# Patient Record
Sex: Female | Born: 1940 | Hispanic: No | Smoking: Former smoker
Health system: Southern US, Community
[De-identification: ages and names within clinical notes are randomized; demographics above are authoritative.]

## PROBLEM LIST (undated history)

## (undated) DIAGNOSIS — E669 Obesity, unspecified: Secondary | ICD-10-CM

## (undated) DIAGNOSIS — I809 Phlebitis and thrombophlebitis of unspecified site: Secondary | ICD-10-CM

## (undated) DIAGNOSIS — L309 Dermatitis, unspecified: Secondary | ICD-10-CM

## (undated) DIAGNOSIS — M1990 Unspecified osteoarthritis, unspecified site: Secondary | ICD-10-CM

## (undated) DIAGNOSIS — F329 Major depressive disorder, single episode, unspecified: Secondary | ICD-10-CM

## (undated) DIAGNOSIS — I639 Cerebral infarction, unspecified: Secondary | ICD-10-CM

## (undated) DIAGNOSIS — J45909 Unspecified asthma, uncomplicated: Secondary | ICD-10-CM

## (undated) DIAGNOSIS — G4730 Sleep apnea, unspecified: Secondary | ICD-10-CM

## (undated) DIAGNOSIS — I10 Essential (primary) hypertension: Secondary | ICD-10-CM

## (undated) DIAGNOSIS — R319 Hematuria, unspecified: Secondary | ICD-10-CM

## (undated) DIAGNOSIS — I2510 Atherosclerotic heart disease of native coronary artery without angina pectoris: Secondary | ICD-10-CM

## (undated) DIAGNOSIS — E079 Disorder of thyroid, unspecified: Secondary | ICD-10-CM

## (undated) DIAGNOSIS — E119 Type 2 diabetes mellitus without complications: Secondary | ICD-10-CM

## (undated) DIAGNOSIS — Z5189 Encounter for other specified aftercare: Secondary | ICD-10-CM

## (undated) DIAGNOSIS — I2699 Other pulmonary embolism without acute cor pulmonale: Secondary | ICD-10-CM

## (undated) DIAGNOSIS — K219 Gastro-esophageal reflux disease without esophagitis: Secondary | ICD-10-CM

## (undated) DIAGNOSIS — E785 Hyperlipidemia, unspecified: Secondary | ICD-10-CM

## (undated) DIAGNOSIS — F410 Panic disorder [episodic paroxysmal anxiety] without agoraphobia: Secondary | ICD-10-CM

## (undated) DIAGNOSIS — I509 Heart failure, unspecified: Secondary | ICD-10-CM

## (undated) DIAGNOSIS — J449 Chronic obstructive pulmonary disease, unspecified: Secondary | ICD-10-CM

## (undated) DIAGNOSIS — F988 Other specified behavioral and emotional disorders with onset usually occurring in childhood and adolescence: Secondary | ICD-10-CM

## (undated) DIAGNOSIS — F32A Depression, unspecified: Secondary | ICD-10-CM

## (undated) DIAGNOSIS — I739 Peripheral vascular disease, unspecified: Secondary | ICD-10-CM

## (undated) HISTORY — PX: BREAST SURGERY: SHX581

## (undated) HISTORY — PX: ABDOMINAL HYSTERECTOMY: SHX81

## (undated) HISTORY — PX: FRACTURE SURGERY: SHX138

## (undated) HISTORY — PX: CHOLECYSTECTOMY: SHX55

## (undated) HISTORY — PX: BACK SURGERY: SHX140

## (undated) HISTORY — PX: BREAST BIOPSY: SHX20

## (undated) HISTORY — PX: TONSILLECTOMY: SUR1361

## (undated) HISTORY — PX: CARDIAC SURGERY: SHX584

---

## 2003-10-06 ENCOUNTER — Other Ambulatory Visit: Payer: Self-pay

## 2003-10-24 ENCOUNTER — Other Ambulatory Visit: Payer: Self-pay

## 2004-05-28 ENCOUNTER — Emergency Department: Payer: Self-pay | Admitting: Emergency Medicine

## 2004-05-28 ENCOUNTER — Other Ambulatory Visit: Payer: Self-pay

## 2005-08-30 ENCOUNTER — Other Ambulatory Visit: Payer: Self-pay

## 2005-08-30 ENCOUNTER — Emergency Department: Payer: Self-pay | Admitting: General Practice

## 2006-02-14 ENCOUNTER — Ambulatory Visit: Payer: Self-pay | Admitting: Nurse Practitioner

## 2006-06-01 ENCOUNTER — Ambulatory Visit: Payer: Self-pay | Admitting: Internal Medicine

## 2006-06-17 ENCOUNTER — Ambulatory Visit: Payer: Self-pay | Admitting: Cardiovascular Disease

## 2006-11-15 ENCOUNTER — Other Ambulatory Visit: Payer: Self-pay

## 2006-11-15 ENCOUNTER — Inpatient Hospital Stay: Payer: Self-pay | Admitting: *Deleted

## 2007-01-06 ENCOUNTER — Ambulatory Visit: Payer: Self-pay | Admitting: Family Medicine

## 2007-09-02 ENCOUNTER — Emergency Department: Payer: Self-pay | Admitting: Unknown Physician Specialty

## 2007-09-13 ENCOUNTER — Emergency Department: Payer: Self-pay | Admitting: Unknown Physician Specialty

## 2007-11-20 ENCOUNTER — Ambulatory Visit: Payer: Self-pay | Admitting: Family Medicine

## 2007-11-23 ENCOUNTER — Encounter: Payer: Self-pay | Admitting: Specialist

## 2007-11-29 ENCOUNTER — Encounter: Payer: Self-pay | Admitting: Specialist

## 2007-12-30 ENCOUNTER — Encounter: Payer: Self-pay | Admitting: Specialist

## 2008-01-30 ENCOUNTER — Encounter: Payer: Self-pay | Admitting: Specialist

## 2008-02-29 ENCOUNTER — Encounter: Payer: Self-pay | Admitting: Specialist

## 2008-03-30 ENCOUNTER — Emergency Department: Payer: Self-pay | Admitting: Emergency Medicine

## 2008-04-01 ENCOUNTER — Encounter: Payer: Self-pay | Admitting: Specialist

## 2008-09-25 ENCOUNTER — Inpatient Hospital Stay: Payer: Self-pay | Admitting: Internal Medicine

## 2008-11-21 ENCOUNTER — Emergency Department: Payer: Self-pay | Admitting: Emergency Medicine

## 2009-01-16 ENCOUNTER — Ambulatory Visit: Payer: Self-pay | Admitting: Family Medicine

## 2009-01-23 ENCOUNTER — Ambulatory Visit: Payer: Self-pay | Admitting: Family Medicine

## 2009-03-26 ENCOUNTER — Ambulatory Visit: Payer: Self-pay | Admitting: Surgery

## 2009-03-31 ENCOUNTER — Ambulatory Visit: Payer: Self-pay | Admitting: Cardiology

## 2009-03-31 ENCOUNTER — Ambulatory Visit: Payer: Self-pay | Admitting: Surgery

## 2009-06-16 ENCOUNTER — Ambulatory Visit: Payer: Self-pay | Admitting: Cardiovascular Disease

## 2009-06-28 ENCOUNTER — Inpatient Hospital Stay: Payer: Self-pay | Admitting: Internal Medicine

## 2009-07-16 ENCOUNTER — Encounter: Payer: Self-pay | Admitting: Internal Medicine

## 2009-07-29 ENCOUNTER — Encounter: Payer: Self-pay | Admitting: Internal Medicine

## 2009-08-29 ENCOUNTER — Encounter: Payer: Self-pay | Admitting: Internal Medicine

## 2009-10-29 ENCOUNTER — Ambulatory Visit: Payer: Self-pay | Admitting: Gastroenterology

## 2009-12-24 ENCOUNTER — Inpatient Hospital Stay: Payer: Self-pay | Admitting: Internal Medicine

## 2010-01-10 ENCOUNTER — Emergency Department: Payer: Self-pay | Admitting: Emergency Medicine

## 2010-01-12 ENCOUNTER — Inpatient Hospital Stay: Payer: Self-pay | Admitting: Internal Medicine

## 2010-02-25 ENCOUNTER — Inpatient Hospital Stay: Payer: Self-pay | Admitting: Internal Medicine

## 2010-04-06 ENCOUNTER — Other Ambulatory Visit: Payer: Self-pay | Admitting: Internal Medicine

## 2010-06-29 IMAGING — CR DG CHEST 2V
1 series · 2 of 2 positions shown · non-contrast
Comparison: none

REASON FOR EXAM: hypotension
COMMENTS:   May transport without cardiac monitor

PROCEDURE:     DXR - DXR CHEST PA (OR AP) AND LATERAL  - June 28, 2009  [DATE]
RESULT:     Comparison is made to a prior study 09/25/2008. Again noted is
mild elevation of the left hemidiaphragm with mild left base atelectasis in
the right lung. The cardiovascular structures are unremarkable.

[Series 1: view not recorded · 0.17mm/px · 2 of 2 slices shown]
[im 1/2]
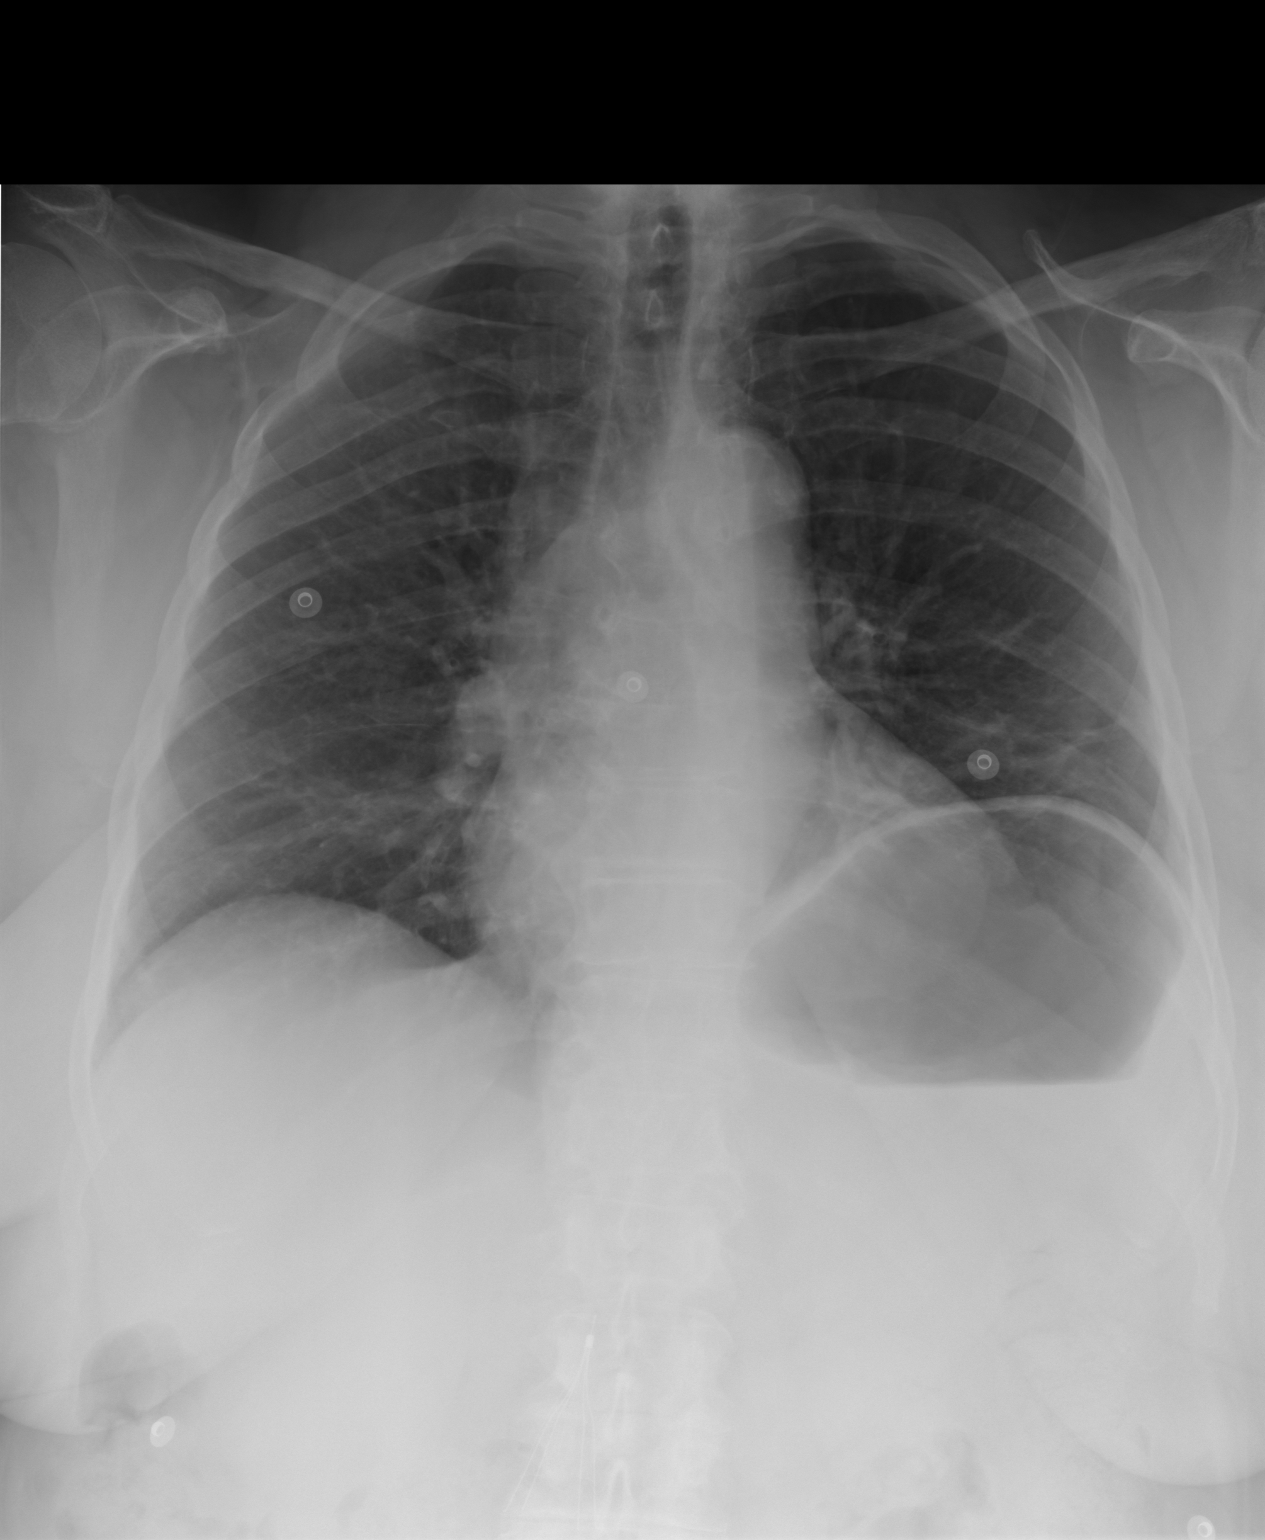
[im 2/2]
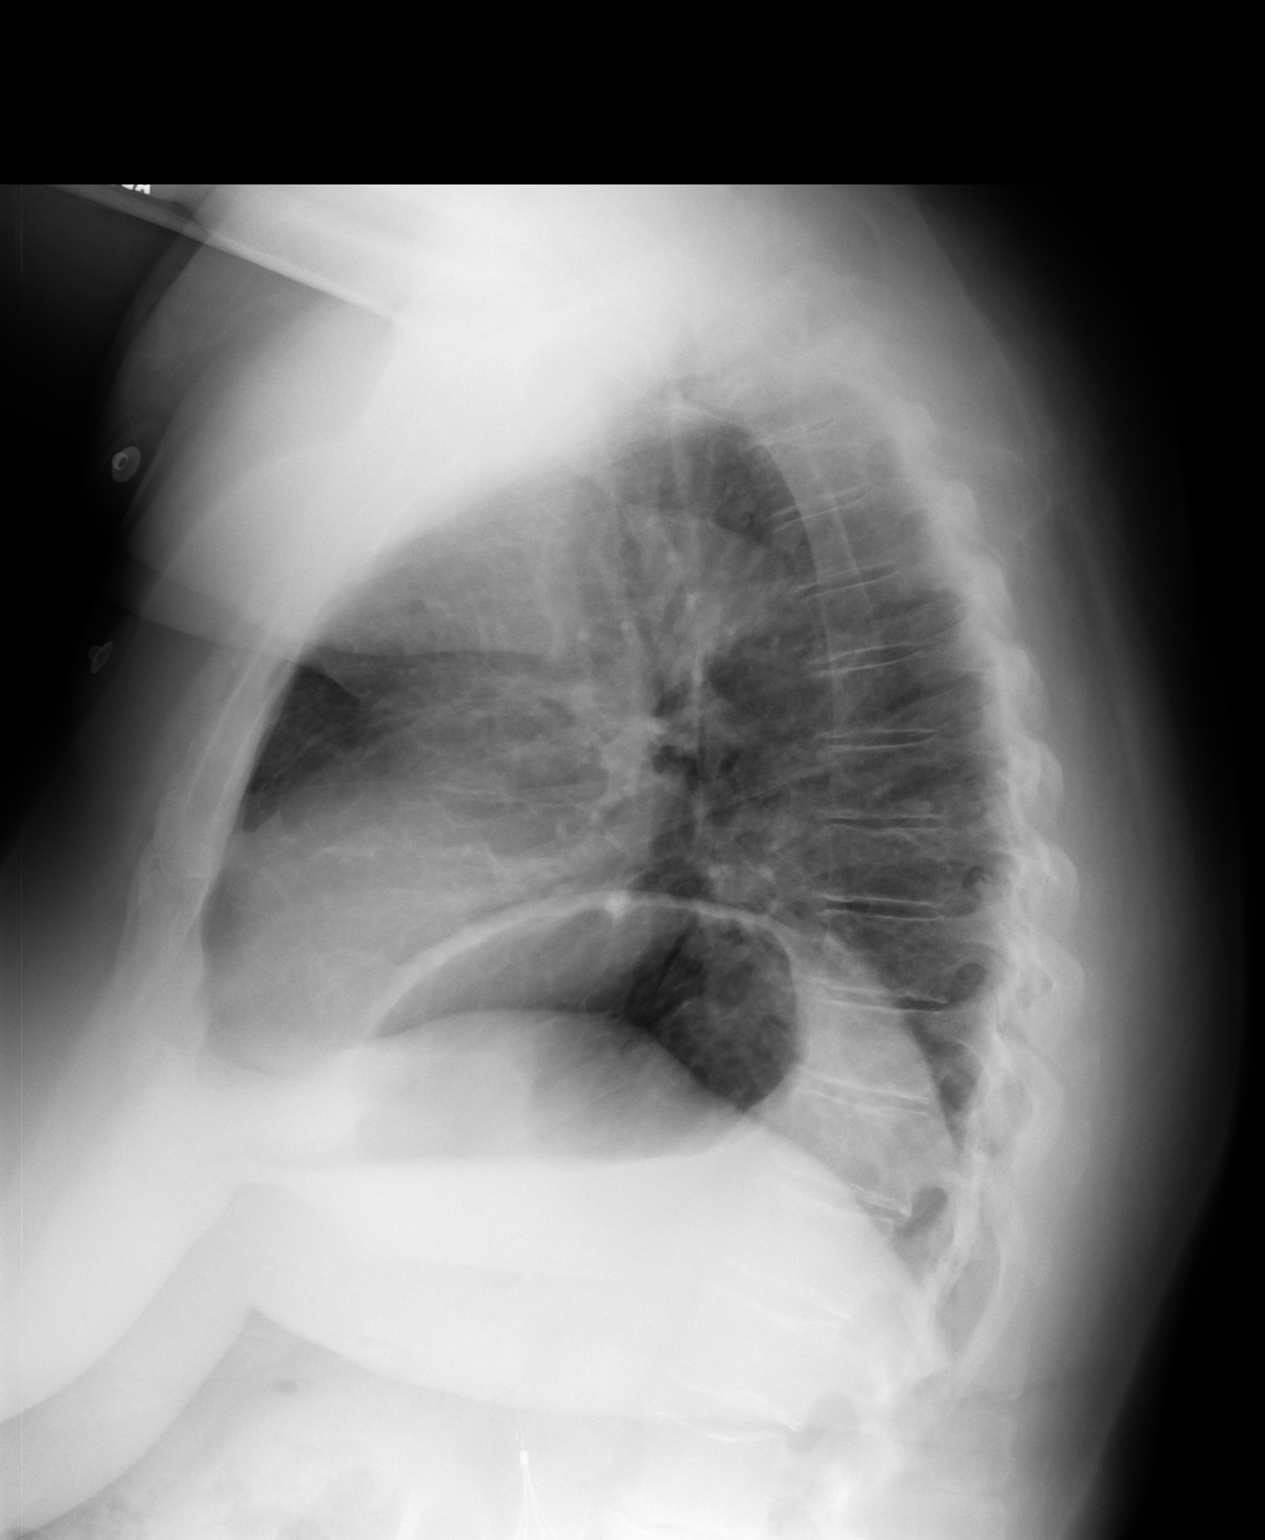

[2 of 2 positions shown; findings below may reference images not displayed]

IMPRESSION: No acute cardiopulmonary disease.

## 2010-08-20 ENCOUNTER — Ambulatory Visit: Payer: Self-pay | Admitting: Internal Medicine

## 2011-10-06 ENCOUNTER — Ambulatory Visit: Payer: Self-pay | Admitting: Internal Medicine

## 2012-02-01 ENCOUNTER — Ambulatory Visit: Payer: Self-pay | Admitting: Specialist

## 2012-02-07 ENCOUNTER — Encounter: Payer: Self-pay | Admitting: Cardiothoracic Surgery

## 2012-02-07 ENCOUNTER — Encounter: Payer: Self-pay | Admitting: Nurse Practitioner

## 2012-02-29 ENCOUNTER — Encounter: Payer: Self-pay | Admitting: Nurse Practitioner

## 2012-02-29 ENCOUNTER — Encounter: Payer: Self-pay | Admitting: Cardiothoracic Surgery

## 2012-03-20 ENCOUNTER — Emergency Department: Payer: Self-pay | Admitting: Emergency Medicine

## 2012-03-21 LAB — CBC WITH DIFFERENTIAL/PLATELET
Basophil #: 0.1 10*3/uL (ref 0.0–0.1)
Basophil %: 1.1 %
Eosinophil #: 0.2 10*3/uL (ref 0.0–0.7)
Eosinophil %: 1.5 %
HCT: 41 % (ref 35.0–47.0)
Lymphocyte #: 2.6 10*3/uL (ref 1.0–3.6)
Lymphocyte %: 23.2 %
MCH: 26.3 pg (ref 26.0–34.0)
MCHC: 32.3 g/dL (ref 32.0–36.0)
MCV: 82 fL (ref 80–100)
Monocyte #: 1 x10 3/mm — ABNORMAL HIGH (ref 0.2–0.9)
Neutrophil %: 65.8 %
Platelet: 244 10*3/uL (ref 150–440)
RBC: 5.02 10*6/uL (ref 3.80–5.20)
RDW: 15.1 % — ABNORMAL HIGH (ref 11.5–14.5)
WBC: 11.3 10*3/uL — ABNORMAL HIGH (ref 3.6–11.0)

## 2012-03-21 LAB — COMPREHENSIVE METABOLIC PANEL
Albumin: 3.9 g/dL (ref 3.4–5.0)
Alkaline Phosphatase: 120 U/L (ref 50–136)
Anion Gap: 12 (ref 7–16)
Calcium, Total: 9.3 mg/dL (ref 8.5–10.1)
Chloride: 100 mmol/L (ref 98–107)
Co2: 26 mmol/L (ref 21–32)
Glucose: 124 mg/dL — ABNORMAL HIGH (ref 65–99)
Osmolality: 279 (ref 275–301)
Potassium: 3.5 mmol/L (ref 3.5–5.1)
SGOT(AST): 34 U/L (ref 15–37)
Total Protein: 8.6 g/dL — ABNORMAL HIGH (ref 6.4–8.2)

## 2012-03-21 LAB — PRO B NATRIURETIC PEPTIDE: B-Type Natriuretic Peptide: 299 pg/mL — ABNORMAL HIGH (ref 0–125)

## 2012-03-21 LAB — TROPONIN I: Troponin-I: <0.02 ng/mL

## 2013-02-19 ENCOUNTER — Emergency Department: Payer: Self-pay | Admitting: Emergency Medicine

## 2013-02-19 LAB — COMPREHENSIVE METABOLIC PANEL
Albumin: 3.3 g/dL — ABNORMAL LOW (ref 3.4–5.0)
Alkaline Phosphatase: 120 U/L (ref 50–136)
Anion Gap: 11 (ref 7–16)
BUN: 25 mg/dL — ABNORMAL HIGH (ref 7–18)
Calcium, Total: 8.9 mg/dL (ref 8.5–10.1)
EGFR (African American): 52 — ABNORMAL LOW
EGFR (Non-African Amer.): 45 — ABNORMAL LOW
Osmolality: 271 (ref 275–301)
Potassium: 4.8 mmol/L (ref 3.5–5.1)
SGOT(AST): 48 U/L — ABNORMAL HIGH (ref 15–37)
SGPT (ALT): 21 U/L (ref 12–78)
Sodium: 133 mmol/L — ABNORMAL LOW (ref 136–145)
Total Protein: 7.6 g/dL (ref 6.4–8.2)

## 2013-02-19 LAB — CBC
HCT: 34.6 % — ABNORMAL LOW (ref 35.0–47.0)
MCH: 25.9 pg — ABNORMAL LOW (ref 26.0–34.0)
MCHC: 33.1 g/dL (ref 32.0–36.0)
Platelet: 172 10*3/uL (ref 150–440)
RBC: 4.43 10*6/uL (ref 3.80–5.20)
RDW: 16.1 % — ABNORMAL HIGH (ref 11.5–14.5)
WBC: 8.5 10*3/uL (ref 3.6–11.0)

## 2013-02-19 LAB — TROPONIN I: Troponin-I: <0.02 ng/mL

## 2013-02-19 LAB — PRO B NATRIURETIC PEPTIDE: B-Type Natriuretic Peptide: 470 pg/mL — ABNORMAL HIGH (ref 0–125)

## 2013-02-19 LAB — CK TOTAL AND CKMB (NOT AT ARMC): CK-MB: 1.5 ng/mL (ref 0.5–3.6)

## 2013-05-27 ENCOUNTER — Emergency Department: Payer: Self-pay | Admitting: Emergency Medicine

## 2013-05-27 LAB — COMPREHENSIVE METABOLIC PANEL
BUN: 34 mg/dL — ABNORMAL HIGH (ref 7–18)
Bilirubin,Total: 0.3 mg/dL (ref 0.2–1.0)
Calcium, Total: 8.9 mg/dL (ref 8.5–10.1)
Chloride: 102 mmol/L (ref 98–107)
EGFR (African American): 37 — ABNORMAL LOW
EGFR (Non-African Amer.): 32 — ABNORMAL LOW
Glucose: 89 mg/dL (ref 65–99)
Potassium: 4.6 mmol/L (ref 3.5–5.1)
SGPT (ALT): 18 U/L (ref 12–78)
Sodium: 137 mmol/L (ref 136–145)
Total Protein: 7.9 g/dL (ref 6.4–8.2)

## 2013-05-27 LAB — CBC
HCT: 35.8 % (ref 35.0–47.0)
MCV: 79 fL — ABNORMAL LOW (ref 80–100)
Platelet: 211 10*3/uL (ref 150–440)
RBC: 4.51 10*6/uL (ref 3.80–5.20)
RDW: 14.9 % — ABNORMAL HIGH (ref 11.5–14.5)
WBC: 9.7 10*3/uL (ref 3.6–11.0)

## 2013-05-27 LAB — APTT: Activated PTT: 32.3 secs (ref 23.6–35.9)

## 2013-05-27 LAB — PROTIME-INR
INR: 1
Prothrombin Time: 13.4 secs (ref 11.5–14.7)

## 2014-09-20 NOTE — Consult Note (Signed)
PATIENT NAME:  Kristen Watts, Kristen Watts MR#:  [id number] DATE OF BIRTH:  16-Jan-1941  DATE OF CONSULTATION:  02/19/2013  REFERRING PHYSICIAN:  Aletha Halim, MD.  CONSULTING PHYSICIAN:  Chariah Bailey A. Allena Katz, MD.  CARDIOLOGIST: Marcina Millard, MD.   CHIEF COMPLAINT: Pain in between shoulder blades for 1 to 2 days.   HISTORY OF PRESENT ILLNESS: Kristen Watts is a 74 year old Caucasian female with past medical history of COPD, hypertension, type 2 diabetes, hyperlipidemia, and coronary artery disease, who comes to the Emergency Room after she started noticing some discomfort in her back in between the shoulder blades. The patient says she could not get comfortable after a couple nitros did not make much difference. Came to the Emergency Room where she is hemodynamically stable without any acute EKG changes.   Her first set of troponins is negative. She denies any chest pain or shortness of breath. The patient's pain is reproducible on rubbing in between her shoulder blades. She then reports that she has been very anxious and stressed out at home trying to get her financial stuff organized and has not been sleeping well for the past several days along with exhausting trips to the bank trying to get accounts set up. She felt better after she got 2 Norcos  in the Emergency Room. Internal medicine was consulted for her above symptoms.   PAST MEDICAL HISTORY:  1.  History of hypothyroidism.  2.  CAD status post last heart catheter done in 2008, which showed moderate 2-vessel disease. Medical management was recommended.  3.  History of PE. 4.  Type 2 diabetes.  5.  Hypertension.  6.  Osteoarthritis.  7.  History of CVA, peripheral vascular disease.  8.  History of panic attacks.  9.  Chronic pain.  10.  ADHD.  11.  Hyperlipidemia.   PAST SURGICAL HISTORY:  1.  Hysterectomy.  2.  Right knee placement.  3.  Right great toe surgery.  4.  Bilateral cervical rib resection.  5.  Cervical disk fusion.  6.   Laparotomy.   ALLERGIES: DARVON, DEMEROL, LATEX, AND MORPHINE.   SOCIAL HISTORY: Lives at home by herself. Negative for alcohol or tobacco use.   FAMILY HISTORY: Positive for hypertension and sudden death.   MEDICATIONS:  1.  Xanax 0.25 mg tablet 1 tablet two to 3 times a day as needed.  2.  Losartan 160 mg p.o. daily.  3.  Temazepam 15 mg p.o. daily at bedtime.  4.  Synthroid 150 mcg p.o. daily.  5.  Strattera 40 mg p.o. daily.  6.  Omega-3 polyunsaturated fatty acid 1000 mg one tablet b.i.d.  7.  Nitroglycerin 0.4 mg sublingual as needed.  8.  Singulair 10 mg daily.  9.  Metformin 500 mg b.i.d.  10.  Isosorbide mononitrate 30 mg extended release p.o. daily.  11.  Lasix 40 mg p.o. daily.  12.  Dexilant 60 mg p.o. daily.  13.  Celebrex 200 mg daily.  14.  Carvedilol 6.25 b.i.d.  15.  DuoNebs 3 mL every 4 hours as needed.  16.  Advair 250/50 one puff b.i.d.  17.  Acetaminophen/hydrocodone 5/325, one tablet every 4 hours as needed.   REVIEW OF SYSTEMS:  CONSTITUTIONAL: No fever, fatigue, weakness.  EYES: No blurred or double vision, glaucoma or cataracts.  ENT: No tinnitus, ear pain, hearing loss.  RESPIRATORY: No cough, wheeze, hemoptysis or dyspnea.  CARDIOVASCULAR: No chest pain, orthopnea, edema. Positive for hypertension. GASTROINTESTINAL: No nausea, vomiting, diarrhea, abdominal pain or GERD.  GENITOURINARY: No dysuria, hematuria, frequency, or incontinence.  ENDOCRINE: No polyuria, nocturia or thyroid problems.  HEMATOLOGY: No anemia or easy bruising or bleeding.  SKIN: No acne, rash or lesions.  MUSCULOSKELETAL: Positive for pain in between the shoulder blades and arthritis. No gout or swelling of joints.  NEUROLOGIC: No CVA, TIA, ataxia, or dementia.  PSYCHIATRIC: No anxiety, depression or bipolar disorder.   All other systems reviewed and negative.   PHYSICAL EXAMINATION:  GENERAL: Awake, alert, oriented x3, not in acute distress.  VITAL SIGNS: Afebrile. Pulse is  80. Blood pressure is 91/52. Sats are 94% on room air.  HEENT: Atraumatic, normocephalic. Pupils PERRLA. EOM intact. Oral mucosa is moist.  NECK: Supple. No JVD. No carotid bruit.  RESPIRATORY: Clear to auscultation bilaterally. No rales, rhonchi, respiratory distress or labored breathing.  CARDIOVASCULAR: Both heart sounds are normal. Rate, rhythm regular. PMI not lateralized. Chest is nontender. Good pedal pulses. Good femoral pulses. No lower extremity edema.  ABDOMEN: Soft, benign, nontender. No organomegaly. Positive bowel sounds.  NEUROLOGIC: Grossly intact cranial nerves II through XII are normal. Motor or sensory deficit.  EXTREMITIES: Good pedal pulses. Good femoral pulses. No lower extremity edema.  BACK AND SPINE: The patient does have tenderness in between her shoulder blades in the upper part which is reproducible on palpation. Pain is localized. Appears muscular strain.  SKIN: Warm and dry.  PSYCHIATRIC: Awake, alert, oriented x3.   EKG shows sinus rhythm with PVC.   CHEST X-RAY: No acute cardiopulmonary abnormality.   Cardiac enzymes, first set negative.   Glucose is 106. BUN is 25. Creatinine is 1.21. Sodium is 133. LFTs within normal limits except SGOT of 48.   White count is 8.5. Hemoglobin and hematocrit is 11.4 and 34.6. Platelet count is 172. MCV is 78.   B-type natriuretic peptide is 470.   ASSESSMENT AND PLAN: Kristen Watts, 74 years old, is with history of hypertension, diabetes, hyperlipidemia, and coronary artery disease, and comes in: 1.  Back pain in between shoulder blades. It appears to be muscular strain since it is reproducible on palpation and on back rub. She reports not really sleeping well, had a lot of stress over taking care of finances at home by herself. Her EKG showed no acute changes. Troponins are negative. No acute chest pain or shortness of breath. The patient is pain free at present. She reports taking muscle relaxants which help her, and Xanax  helps her with her discomfort at home. Last catheterization in 2008 showed mild-to-moderate 2-vessel disease. Will continue her  cardiac medications. The case was discussed with Dr. Juliann Paresallwood who agrees with the above and will get a follow-up with Dr. Darrold JunkerParaschos in the next week. Use heating pad and over-the-counter capsaicin rub for back pain.  2.  Hypertension. Continue home medications.  3.  Coronary artery disease with moderate 2-vessel disease as per catheterization in 2008.  4.  Chronic obstructive pulmonary disease, appears stable.   The patient is okay to go home. Follow up with Dr. Darrold JunkerParaschos next week. She is advised to return to the Emergency Room if symptoms worsen.   TIME SPENT: 50 minutes.    ____________________________ Wylie HailSona A. Allena KatzPatel, MD sap:np D: 02/19/2013 15:11:03 ET T: 02/19/2013 15:54:35 ET JOB#: [id number]  cc: Ervie Mccard A. Allena KatzPatel, MD, <Dictator> Duane LopeJeffrey D. Judithann SheenSparks, MD Marcina MillardAlexander Paraschos, MD Willow OraSONA A Keniyah Gelinas MD ELECTRONICALLY SIGNED 02/27/2013 14:11

## 2014-10-19 ENCOUNTER — Emergency Department: Payer: Medicare Other

## 2014-10-19 ENCOUNTER — Emergency Department
Admission: EM | Admit: 2014-10-19 | Discharge: 2014-10-20 | Disposition: A | Discharge status: Home / Self Care | Payer: Medicare Other | Attending: Emergency Medicine | Admitting: Emergency Medicine

## 2014-10-19 ENCOUNTER — Encounter: Payer: Self-pay | Admitting: Emergency Medicine

## 2014-10-19 DIAGNOSIS — F29 Unspecified psychosis not due to a substance or known physiological condition: Secondary | ICD-10-CM | POA: Diagnosis not present

## 2014-10-19 DIAGNOSIS — F329 Major depressive disorder, single episode, unspecified: Secondary | ICD-10-CM | POA: Insufficient documentation

## 2014-10-19 DIAGNOSIS — E119 Type 2 diabetes mellitus without complications: Secondary | ICD-10-CM | POA: Insufficient documentation

## 2014-10-19 DIAGNOSIS — Z008 Encounter for other general examination: Secondary | ICD-10-CM | POA: Diagnosis present

## 2014-10-19 DIAGNOSIS — I10 Essential (primary) hypertension: Secondary | ICD-10-CM | POA: Diagnosis not present

## 2014-10-19 DIAGNOSIS — F419 Anxiety disorder, unspecified: Secondary | ICD-10-CM | POA: Diagnosis not present

## 2014-10-19 HISTORY — DX: Essential (primary) hypertension: I10

## 2014-10-19 HISTORY — DX: Unspecified asthma, uncomplicated: J45.909

## 2014-10-19 HISTORY — DX: Encounter for other specified aftercare: Z51.89

## 2014-10-19 HISTORY — DX: Chronic obstructive pulmonary disease, unspecified: J44.9

## 2014-10-19 HISTORY — DX: Heart failure, unspecified: I50.9

## 2014-10-19 HISTORY — DX: Atherosclerotic heart disease of native coronary artery without angina pectoris: I25.10

## 2014-10-19 HISTORY — DX: Type 2 diabetes mellitus without complications: E11.9

## 2014-10-19 HISTORY — DX: Unspecified osteoarthritis, unspecified site: M19.90

## 2014-10-19 LAB — COMPREHENSIVE METABOLIC PANEL
ALT: 16 U/L (ref 14–54)
AST: 28 U/L (ref 15–41)
Albumin: 3.9 g/dL (ref 3.5–5.0)
Alkaline Phosphatase: 73 U/L (ref 38–126)
Anion gap: 11 (ref 5–15)
BUN: 19 mg/dL (ref 6–20)
CHLORIDE: 101 mmol/L (ref 101–111)
CO2: 26 mmol/L (ref 22–32)
CREATININE: 1.15 mg/dL — AB (ref 0.44–1.00)
Calcium: 9.5 mg/dL (ref 8.9–10.3)
GFR calc Af Amer: 54 mL/min — ABNORMAL LOW (ref >60)
GFR, EST NON AFRICAN AMERICAN: 46 mL/min — AB (ref >60)
GLUCOSE: 141 mg/dL — AB (ref 65–99)
Potassium: 3.8 mmol/L (ref 3.5–5.1)
Sodium: 138 mmol/L (ref 135–145)
Total Bilirubin: 0.3 mg/dL (ref 0.3–1.2)
Total Protein: 7.8 g/dL (ref 6.5–8.1)

## 2014-10-19 LAB — URINE DRUG SCREEN, QUALITATIVE (ARMC ONLY)
Amphetamines, Ur Screen: NOT DETECTED
BARBITURATES, UR SCREEN: NOT DETECTED
BENZODIAZEPINE, UR SCRN: POSITIVE — AB
CANNABINOID 50 NG, UR ~~LOC~~: NOT DETECTED
COCAINE METABOLITE, UR ~~LOC~~: NOT DETECTED
MDMA (Ecstasy)Ur Screen: NOT DETECTED
Methadone Scn, Ur: NOT DETECTED
OPIATE, UR SCREEN: NOT DETECTED
Phencyclidine (PCP) Ur S: NOT DETECTED
Tricyclic, Ur Screen: POSITIVE — AB

## 2014-10-19 LAB — URINALYSIS COMPLETE WITH MICROSCOPIC (ARMC ONLY)
Bilirubin Urine: NEGATIVE
GLUCOSE, UA: NEGATIVE mg/dL
KETONES UR: NEGATIVE mg/dL
Nitrite: NEGATIVE
Protein, ur: NEGATIVE mg/dL
SPECIFIC GRAVITY, URINE: 1.012 (ref 1.005–1.030)
pH: 5 (ref 5.0–8.0)

## 2014-10-19 LAB — CBC
HCT: 35.4 % (ref 35.0–47.0)
Hemoglobin: 11.3 g/dL — ABNORMAL LOW (ref 12.0–16.0)
MCH: 25.1 pg — ABNORMAL LOW (ref 26.0–34.0)
MCHC: 32 g/dL (ref 32.0–36.0)
MCV: 78.4 fL — ABNORMAL LOW (ref 80.0–100.0)
Platelets: 159 10*3/uL (ref 150–440)
RBC: 4.52 MIL/uL (ref 3.80–5.20)
RDW: 15.5 % — ABNORMAL HIGH (ref 11.5–14.5)
WBC: 8 10*3/uL (ref 3.6–11.0)

## 2014-10-19 LAB — TSH: TSH: 0.797 u[IU]/mL (ref 0.350–4.500)

## 2014-10-19 LAB — ETHANOL: Alcohol, Ethyl (B): <5 mg/dL (ref <5)

## 2014-10-19 LAB — SALICYLATE LEVEL: Salicylate Lvl: <4 mg/dL (ref 2.8–30.0)

## 2014-10-19 LAB — TROPONIN I: Troponin I: <0.03 ng/mL (ref <0.031)

## 2014-10-19 LAB — MAGNESIUM: Magnesium: 1.7 mg/dL (ref 1.7–2.4)

## 2014-10-19 MED ORDER — ACETAMINOPHEN 325 MG PO TABS: 650.0000 mg | ORAL_TABLET | Freq: Four times a day (QID) | ORAL | Status: DC | PRN

## 2014-10-19 MED ORDER — MAGNESIUM HYDROXIDE 400 MG/5ML PO SUSP: 30.0000 mL | Freq: Every day | ORAL | Status: DC | PRN

## 2014-10-19 MED ORDER — ALUM & MAG HYDROXIDE-SIMETH 200-200-20 MG/5ML PO SUSP: 30.0000 mL | ORAL | Status: DC | PRN

## 2014-10-19 NOTE — BH Assessment (Signed)
Writer assessed the patient.  Patient reports that she has been seeing tiles melting on the ceiling.  Patient reports that she was able to breath in the water so everything will be all right.  Patient was not orientated to situation.  Patient was rambeling during the assessment.  Patient reports that she lives alone with out any family in the state.  Patient reports that she has a sister in [place].  Writer informed the nurse.  Patient will be assessed by the Psychiatrist in the morning.

## 2014-10-19 NOTE — ED Notes (Signed)

## 2014-10-19 NOTE — ED Notes (Signed)
Pt changed into bhm scrubs since now ivc'd. Pt states she believes the ems did not see the water running down the walls or all over the floor because she breathed it in.

## 2014-10-19 NOTE — ED Notes (Signed)
Ems states they were called out by friend - pt was rambling saying the world was imploding. She had packed up most of the house and had it sitting on the lawn stating that the ceiling tiles were falling (ems checked and they were intact).

## 2014-10-19 NOTE — BH Assessment (Signed)
Assessment Note  Kristen Watts is a 74 year old female that was brought to the ED by EMS.  Per EMS patient was rambling saying the world was imploding. She had packed up most of the house and had it was sitting on the lawn.  Patient was stating that the ceiling tiles were falling.  Per EMS they checked and the tiles were intact.     During the assessment the patient was anxious and expressed flight of ideas.  Patient was not able to answer question in a coherent manner.  Patient continued to reports that, "the tiles in her home were melting off her ceiling like water".  Patient also reported that, "it was alright because she was able to able to breath in all of the water".  Patient was IVC'd by the emergency room doctor.   Patient denies SI/HI/Substance abuse.  Patient denies any recent stressors.  Patient reports that she lives alone and she does not know why she is here.  Patient did not remember how she came to the ED.  Patient denies prior psychiatric hospitalization.  Patient denies mental health therapy or psychiatric medication management.  Patient denies physical, sexual or emotional abuse.  Patient denies having any family in the state.   Patient reports that she has sisters in [place] but she could not remember their names or phone numbers.  Therefore, Clinical research associatewriter was not able to obtain collateral information.  Patient will be assessed by the  Psychiatrist in the morning.     Axis I: Mood Disorder NOS Axis II: Deferred Axis III:  Past Medical History  Diagnosis Date  . Arthritis   . Asthma   . Blood transfusion without reported diagnosis   . CHF (congestive heart failure)   . COPD (chronic obstructive pulmonary disease)   . Coronary artery disease   . Diabetes mellitus without complication   . Hypertension    Axis IV: other psychosocial or environmental problems, problems related to social environment, problems with access to health care services and problems with primary support  group Axis V: 21-30 behavior considerably influenced by delusions or hallucinations OR serious impairment in judgment, communication OR inability to function in almost all areas  Past Medical History:  Past Medical History  Diagnosis Date  . Arthritis   . Asthma   . Blood transfusion without reported diagnosis   . CHF (congestive heart failure)   . COPD (chronic obstructive pulmonary disease)   . Coronary artery disease   . Diabetes mellitus without complication   . Hypertension     Past Surgical History  Procedure Laterality Date  . Back surgery    . Breast surgery    . Cholecystectomy    . Fracture surgery    . Cardiac surgery    . Abdominal hysterectomy    . Tonsillectomy      Family History: History reviewed. No pertinent family history.  Social History:  reports that she has never smoked. She does not have any smokeless tobacco history on file. She reports that she does not drink alcohol or use illicit drugs.  Additional Social History:  Alcohol / Drug Use History of alcohol / drug use?: No history of alcohol / drug abuse  CIWA: CIWA-Ar BP: 123/60 mmHg Pulse Rate: 79 COWS:    Allergies:  Allergies  Allergen Reactions  . Demerol [Meperidine] Palpitations    Home Medications:  (Not in a hospital admission)  OB/GYN Status:  No LMP recorded. Patient is postmenopausal.  General Assessment Data Location  of Assessment: [facility] ED TTS Assessment: In system Is this a Tele or Face-to-Face Assessment?: Face-to-Face Is this an Initial Assessment or a Re-assessment for this encounter?: Initial Assessment Marital status: Single Maiden name: NA Is patient pregnant?: No Pregnancy Status: No Living Arrangements: Alone Can pt return to current living arrangement?: Yes Admission Status: Involuntary Is patient capable of signing voluntary admission?: No Referral Source: Self/Family/Friend Insurance type: [employer] [employer]  Medical Screening Exam [facility] Walk-in  ONLY) Medical Exam completed: Yes  Crisis Care Plan Living Arrangements: Alone Name of Psychiatrist: None Reported Name of Therapist: None Reported  Education Status Is patient currently in school?: No Current Grade: NA Highest grade of school patient has completed: NA Name of school: NA Contact person: NA  Risk to self with the past 6 months Suicidal Ideation: No Has patient been a risk to self within the past 6 months prior to admission? : No Suicidal Intent: No Has patient had any suicidal intent within the past 6 months prior to admission? : No Is patient at risk for suicide?: No Suicidal Plan?: No Has patient had any suicidal plan within the past 6 months prior to admission? : No Access to Means: No What has been your use of drugs/alcohol within the last 12 months?: None Reported Previous Attempts/Gestures: No How many times?: 0 Other Self Harm Risks: None Reported Triggers for Past Attempts:  (NA) Intentional Self Injurious Behavior: None Family Suicide History: No Recent stressful life event(s): Other (Comment) (None Reported) Persecutory voices/beliefs?: Yes Depression: Yes Depression Symptoms: Despondent, Feeling worthless/self pity Substance abuse history and/or treatment for substance abuse?: No Suicide prevention information given to non-admitted patients: Not applicable  Risk to Others within the past 6 months Homicidal Ideation: No Does patient have any lifetime risk of violence toward others beyond the six months prior to admission? : No Thoughts of Harm to Others: No Current Homicidal Intent: No Current Homicidal Plan: No Access to Homicidal Means: No Identified Victim: None Reported History of harm to others?: No Assessment of Violence: None Noted Violent Behavior Description: None Reported Does patient have access to weapons?: No Criminal Charges Pending?: No Does patient have a court date: No Is patient on probation?:  No  Psychosis Hallucinations: Visual Delusions: Grandiose  Mental Status Report Appearance/Hygiene: Disheveled Eye Contact: Fair Motor Activity: Freedom of movement, Restlessness Speech: Tangential Level of Consciousness: Alert Mood: Anxious Affect: Anxious Anxiety Level: Minimal Thought Processes: Tangential, Flight of Ideas Judgement: Unimpaired Orientation: Person, Place Obsessive Compulsive Thoughts/Behaviors: None  Cognitive Functioning Concentration: Decreased Memory: Recent Impaired, Remote Impaired IQ: Average Insight: Poor Impulse Control: Poor Appetite: Fair Weight Loss: 0 Weight Gain: 0 Sleep: Decreased Total Hours of Sleep: 3 Vegetative Symptoms: None  ADLScreening [facility] Assessment Services) Patient's cognitive ability adequate to safely complete daily activities?: Yes Patient able to express need for assistance with ADLs?: Yes Independently performs ADLs?: Yes (appropriate for developmental age)  Prior Inpatient Therapy Prior Inpatient Therapy: No Prior Therapy Dates: NA Prior Therapy Facilty/Provider(s): NA Reason for Treatment: NA  Prior Outpatient Therapy Prior Outpatient Therapy: No Prior Therapy Dates: NA Prior Therapy Facilty/Provider(s): NA Reason for Treatment: NA Does patient have an ACCT team?: No Does patient have Intensive In-House Services?  : No Does patient have [facility] services? : No Does patient have P4CC services?: No  ADL Screening (condition at time of admission) Patient's cognitive ability adequate to safely complete daily activities?: Yes Is the patient deaf or have difficulty hearing?: No Does the patient have difficulty seeing, even when  wearing glasses/contacts?: No Does the patient have difficulty concentrating, remembering, or making decisions?: Yes Patient able to express need for assistance with ADLs?: Yes Does the patient have difficulty dressing or bathing?: No Independently performs ADLs?: Yes (appropriate for  developmental age) Does the patient have difficulty walking or climbing stairs?: No Weakness of Legs: None Weakness of Arms/Hands: None  Home Assistive Devices/Equipment Home Assistive Devices/Equipment: None    Abuse/Neglect Assessment (Assessment to be complete while patient is alone) Physical Abuse: Denies Verbal Abuse: Denies Sexual Abuse: Denies Exploitation of patient/patient's resources: Denies Self-Neglect: Denies Values / Beliefs Cultural Requests During Hospitalization: None Spiritual Requests During Hospitalization: None Consults Spiritual Care Consult Needed: No Advance Directives (For Healthcare) Does patient have an advance directive?: No    Additional Information 1:1 In Past 12 Months?: No CIRT Risk: No Elopement Risk: No Does patient have medical clearance?: Yes     Disposition: Pending psych disposition.  Disposition Initial Assessment Completed for this Encounter: Yes Disposition of Patient: Other dispositions Other disposition(s):  (pENDING PSYCH DISPOSITION. )  On Site Evaluation by:   Reviewed with Physician:    Phillip Heal LaVerne 10/19/2014 10:51 PM

## 2014-10-19 NOTE — ED Notes (Signed)
While sleeping pt's o2 sats dropped. Put pt on her [place] 2liters that she uses at night.

## 2014-10-19 NOTE — ED Notes (Signed)
BEHAVIORAL HEALTH ROUNDING Patient sleeping: No. Patient alert and oriented: yes Behavior appropriate: Yes.  ; If no, describe:  Nutrition and fluids offered: Yes  Toileting and hygiene offered: Yes  Sitter present: no Law enforcement present: Yes ODS 

## 2014-10-19 NOTE — ED Provider Notes (Signed)
[facility] Emergency Department Provider Note  ____________________________________________  Time seen: Approximately 6:30 PM  I have reviewed the triage vital signs and the nursing notes.   HISTORY  Chief Complaint Psychiatric Evaluation    HPI Kristen Watts is a 74 y.o. female with a history of COPD and CAD who presents today with visual hallucinations. She says that she thinks that the tiles were melting off of her ceiling. She says that the symptoms started about one week ago after she says that she started doing paperwork for her end-of-life planning. She says this has caused her some stress which she thinks may be related to the symptoms. She denies any pain at this time. No shortness of breath or chest pain. She denies any substance ingestion. No suicidal ideation or homicidal ideation. She denies drinking.   Past Medical History  Diagnosis Date  . Arthritis   . Asthma   . Blood transfusion without reported diagnosis   . CHF (congestive heart failure)   . COPD (chronic obstructive pulmonary disease)   . Coronary artery disease   . Diabetes mellitus without complication   . Hypertension     There are no active problems to display for this patient.   Past Surgical History  Procedure Laterality Date  . Back surgery    . Breast surgery    . Cholecystectomy    . Fracture surgery    . Cardiac surgery    . Abdominal hysterectomy    . Tonsillectomy      No current outpatient prescriptions on file.  Allergies Demerol  History reviewed. No pertinent family history.  Social History History  Substance Use Topics  . Smoking status: Never Smoker   . Smokeless tobacco: Not on file  . Alcohol Use: No    Review of Systems Constitutional: No fever/chills Eyes: No visual changes. ENT: No sore throat. Cardiovascular: Denies chest pain. Respiratory: Denies shortness of breath. Gastrointestinal: No abdominal pain.  No nausea, no vomiting.   No diarrhea.  No constipation. Genitourinary: Negative for dysuria. Musculoskeletal: Negative for back pain. Skin: Negative for rash. Neurological: Negative for headaches, focal weakness or numbness. Psychiatric:  Visual hallucinations.  10-point ROS otherwise negative.  ____________________________________________   PHYSICAL EXAM:  VITAL SIGNS: ED Triage Vitals  Enc Vitals Group     BP 10/19/14 1712 110/84 mmHg     Pulse --      Resp 10/19/14 1900 18     Temp 10/19/14 1712 97.7 F (36.5 C)     Temp Source 10/19/14 1712 Oral     SpO2 10/19/14 1712 98 %     Weight 10/19/14 1712 170 lb (77.111 kg)     Height 10/19/14 1712 5' (1.524 m)     Head Cir --      Peak Flow --      Pain Score --      Pain Loc --      Pain Edu? --      Excl. in GC? --     Constitutional: Alert and oriented. Well appearing and in no acute distress. Eyes: Conjunctivae are normal. PERRL. EOMI. Head: Atraumatic. Nose: No congestion/rhinnorhea. Mouth/Throat: Mucous membranes are moist.  Oropharynx non-erythematous. Neck: No stridor.   Cardiovascular: Normal rate, regular rhythm. Grossly normal heart sounds.  Good peripheral circulation. Respiratory: Normal respiratory effort.  No retractions. Lungs CTAB. Gastrointestinal: Soft and nontender. No distention. No abdominal bruits. No CVA tenderness. Musculoskeletal: No lower extremity tenderness nor edema.  No joint effusions.  Neurologic:  Normal speech and language. No gross focal neurologic deficits are appreciated. Speech is normal. No gait instability. Skin:  Skin is warm, dry and intact. No rash noted. Psychiatric: Mood is normal. Patient has a bizarre affect. Not responding to internal stimuli. Tangential speech.  ____________________________________________   LABS (all labs ordered are listed, but only abnormal results are displayed)  Labs Reviewed  CBC - Abnormal; Notable for the following:    Hemoglobin 11.3 (*)    MCV 78.4 (*)    MCH  25.1 (*)    RDW 15.5 (*)    All other components within normal limits  COMPREHENSIVE METABOLIC PANEL - Abnormal; Notable for the following:    Glucose, Bld 141 (*)    Creatinine, Ser 1.15 (*)    GFR calc non Af Amer 46 (*)    GFR calc Af Amer 54 (*)    All other components within normal limits  URINALYSIS COMPLETEWITH MICROSCOPIC ([facility])  - Abnormal; Notable for the following:    Color, Urine YELLOW (*)    APPearance CLEAR (*)    Hgb urine dipstick 1+ (*)    Leukocytes, UA TRACE (*)    Bacteria, UA RARE (*)    Squamous Epithelial / LPF 0-5 (*)    All other components within normal limits  URINE DRUG SCREEN, QUALITATIVE ([facility]) - Abnormal; Notable for the following:    Tricyclic, Ur Screen POSITIVE (*)    Benzodiazepine, Ur Scrn POSITIVE (*)    All other components within normal limits  MAGNESIUM  ETHANOL  TSH  TROPONIN I  SALICYLATE LEVEL   ____________________________________________  EKG  ED ECG REPORT I, Arelia Longest, the attending physician, personally viewed and interpreted this ECG.   Date: 10/19/2014  EKG Time: 1711  Rate: 90  Rhythm: Sinus rhythm with occasional PVCs  Axis: Normal axis  Intervals:none  ST&T Change: No ST elevations or depressions no abnormal T-wave inversions.  ____________________________________________  RADIOLOGY  No acute disease on chest x-ray or CT of the head. ____________________________________________   PROCEDURES    ____________________________________________   INITIAL IMPRESSION / ASSESSMENT AND PLAN / ED COURSE  Pertinent labs & imaging results that were available during my care of the patient were reviewed by me and considered in my medical decision making (see chart for details).  Patient does say he has previous psychiatric admission. Does have a history of depression and ADD. We'll fill out an involuntary papers and have psychiatry see as consult. ____________________________________________   FINAL  CLINICAL IMPRESSION(S) / ED DIAGNOSES  Acute psychosis. Initial visit.    Myrna Blazer, MD 10/19/14 [phone]

## 2014-10-20 MED ORDER — CITALOPRAM HYDROBROMIDE 20 MG PO TABS: 10.0000 mg | ORAL_TABLET | Freq: Every day | ORAL | Status: DC

## 2014-10-20 MED ORDER — RISPERIDONE 1 MG PO TBDP
1.0000 mg | ORAL_TABLET | Freq: Every day | ORAL | Status: DC
  Filled 2014-10-20: qty 1

## 2014-10-20 MED ORDER — CITALOPRAM HYDROBROMIDE 10 MG PO TABS: 10.0000 mg | ORAL_TABLET | Freq: Every day | ORAL | Status: DC

## 2014-10-20 MED ORDER — RISPERIDONE 1 MG PO TABS: 1.0000 mg | ORAL_TABLET | Freq: Every day | ORAL | Status: DC

## 2014-10-20 NOTE — ED Notes (Signed)
BEHAVIORAL HEALTH ROUNDING Patient sleeping: No. Patient alert and oriented: yes Behavior appropriate: Yes.  ; If no, describe:  Nutrition and fluids offered: Yes  Toileting and hygiene offered: Yes  Sitter present: no Law enforcement present: Yes  

## 2014-10-20 NOTE — ED Notes (Signed)
BEHAVIORAL HEALTH ROUNDING Patient sleeping: Yes.   Patient alert and oriented: yes Behavior appropriate: Yes.  ; If no, describe:  Nutrition and fluids offered: NO - pt sleeping Toileting and hygiene offered: NO - pt sleeping Sitter present: YES Patent examinerLaw enforcement present: YES

## 2014-10-20 NOTE — Discharge Instructions (Signed)
Confusion Confusion is the inability to think with your usual speed or clarity. Confusion may come on quickly or slowly over time. How quickly the confusion comes on depends on the cause. Confusion can be due to any number of causes. CAUSES   Concussion, head injury, or head trauma.  Seizures.  Stroke.  Fever.  Brain tumor.  Age related decreased brain function (dementia).  Heightened emotional states like rage or terror.  Mental illness in which the person loses the ability to determine what is real and what is not (hallucinations).  Infections such as a urinary tract infection (UTI).  Toxic effects from alcohol, drugs, or prescription medicines.  Dehydration and an imbalance of salts in the body (electrolytes).  Lack of sleep.  Low blood sugar (diabetes).  Low levels of oxygen from conditions such as chronic lung disorders.  Drug interactions or other medicine side effects.  Nutritional deficiencies, especially niacin, thiamine, vitamin C, or vitamin B.  Sudden drop in body temperature (hypothermia).  Change in routine, such as when traveling or hospitalized. SIGNS AND SYMPTOMS  People often describe their thinking as cloudy or unclear when they are confused. Confusion can also include feeling disoriented. That means you are unaware of where or who you are. You may also not know what the date or time is. If confused, you may also have difficulty paying attention, remembering, and making decisions. Some people also act aggressively when they are confused.  DIAGNOSIS  The medical evaluation of confusion may include:  Blood and urine tests.  X-rays.  Brain and nervous system tests.  Analyzing your brain waves (electroencephalogram or EEG).  Magnetic resonance imaging (MRI) of your head.  Computed tomography (CT) scan of your head.  Mental status tests in which your health care provider may ask many questions. Some of these questions may seem silly or strange,  but they are a very important test to help diagnose and treat confusion. TREATMENT  An admission to the hospital may not be needed, but a person with confusion should not be left alone. Stay with a family member or friend until the confusion clears. Avoid alcohol, pain relievers, or sedative drugs until you have fully recovered. Do not drive until directed by your health care provider. HOME CARE INSTRUCTIONS  What family and friends can do:  To find out if someone is confused, ask the person to state his or her name, age, and the date. If the person is unsure or answers incorrectly, he or she is confused.  Always introduce yourself, no matter how well the person knows you.  Often remind the person of his or her location.  Place a calendar and clock near the confused person.  Help the person with his or her medicines. You may want to use a pill box, an alarm as a reminder, or give the person each dose as prescribed.  Talk about current events and plans for the day.  Try to keep the environment calm, quiet, and peaceful.  Make sure the person keeps follow-up visits with his or her health care provider. PREVENTION  Ways to prevent confusion:  Avoid alcohol.  Eat a balanced diet.  Get enough sleep.  Take medicine only as directed by your health care provider.  Do not become isolated. Spend time with other people and make plans for your days.  Keep careful watch on your blood sugar levels if you are diabetic. SEEK IMMEDIATE MEDICAL CARE IF:   You develop severe headaches, repeated vomiting, seizures, blackouts, or   slurred speech.  There is increasing confusion, weakness, numbness, restlessness, or personality changes.  You develop a loss of balance, have marked dizziness, feel uncoordinated, or fall.  You have delusions, hallucinations, or develop severe anxiety.  Your family members think you need to be rechecked. Document Released: 06/24/2004 Document Revised: 10/01/2013  Document Reviewed: 06/22/2013 ExitCare Patient Information 2015 [employer]. This information is not intended to replace advice given to you by your health care provider. Make sure you discuss any questions you have with your health care provider.  

## 2014-10-20 NOTE — ED Notes (Signed)
BEHAVIORAL HEALTH ROUNDING Patient sleeping: Yes.   Patient alert and oriented: not applicable Behavior appropriate: Yes.  ; If no, describe:  Nutrition and fluids offered: NO - pt sleeping Toileting and hygiene offered: NO - pt sleeping Sitter present: YES Law enforcement present: YES 

## 2014-10-20 NOTE — ED Provider Notes (Signed)
Patient seen and cleared for discharge by Dr. Derrick Ravelhalla  Autumn Gunn, MD 10/20/14 [phone]

## 2014-10-20 NOTE — ED Notes (Signed)

## 2014-10-20 NOTE — ED Notes (Signed)
BEHAVIORAL HEALTH ROUNDING Patient sleeping: No. Patient alert and oriented: yes Behavior appropriate: Yes.  ; If no, describe: reading magazine Nutrition and fluids offered: Yes  Toileting and hygiene offered: Yes  Sitter present: no Law enforcement present: Yes  and ODS

## 2014-10-20 NOTE — ED Provider Notes (Signed)
-----------------------------------------   7:17 AM on 10/20/2014 -----------------------------------------   BP 123/60 mmHg  Pulse 79  Temp(Src) 97.7 F (36.5 C) (Oral)  Resp 16  Ht 5' (1.524 m)  Wt 170 lb (77.111 kg)  BMI 33.20 kg/m2  SpO2 99%  The patient had no acute events since last update.  Calm and cooperative at this time.  Patient awaiting evaluation and placement through the behavioral team   Rebecka ApleyAllison P Konnar Ben, MD 10/20/14 [phone]
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
## 2014-10-20 NOTE — ED Notes (Signed)
Pt is cooperative on unit at this time. Pt is bright, but confused stating "I feel better knowing my hallucinations are real." Pt also stated her eyebrows were falling out "because the layers of her skin are rubbing together and sloughing them off".

## 2014-10-20 NOTE — ED Notes (Signed)
ENVIRONMENTAL ASSESSMENT Potentially harmful objects out of patient reach: Yes.   Personal belongings secured: Yes.   Patient dressed in hospital provided attire only: Yes.   Plastic bags out of patient reach: Yes.   Patient care equipment (cords, cables, call bells, lines, and drains) shortened, removed, or accounted for: Yes.   Equipment and supplies removed from bottom of stretcher: Yes.   Potentially toxic materials out of patient reach: Yes.   Sharps container removed or out of patient reach: Yes.     Report received from [place] KeyNoel, [place]

## 2014-10-20 NOTE — ED Notes (Signed)
BEHAVIORAL HEALTH ROUNDING Patient sleeping: Yes.   Patient alert and oriented: yes Behavior appropriate: Yes.  ; If no, describe:  Nutrition and fluids offered: NO - pt sleeping Toileting and hygiene offered: NO - pt sleeping Sitter present: YES Law enforcement present: YES 
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
## 2015-11-06 ENCOUNTER — Emergency Department
Admission: EM | Admit: 2015-11-06 | Discharge: 2015-11-06 | Disposition: A | Discharge status: Home / Self Care | Payer: Medicare Other | Attending: Emergency Medicine | Admitting: Emergency Medicine

## 2015-11-06 ENCOUNTER — Encounter: Payer: Self-pay | Admitting: Emergency Medicine

## 2015-11-06 DIAGNOSIS — M1990 Unspecified osteoarthritis, unspecified site: Secondary | ICD-10-CM | POA: Insufficient documentation

## 2015-11-06 DIAGNOSIS — B372 Candidiasis of skin and nail: Secondary | ICD-10-CM | POA: Diagnosis not present

## 2015-11-06 DIAGNOSIS — I110 Hypertensive heart disease with heart failure: Secondary | ICD-10-CM | POA: Insufficient documentation

## 2015-11-06 DIAGNOSIS — R21 Rash and other nonspecific skin eruption: Secondary | ICD-10-CM | POA: Diagnosis present

## 2015-11-06 DIAGNOSIS — I2510 Atherosclerotic heart disease of native coronary artery without angina pectoris: Secondary | ICD-10-CM | POA: Diagnosis not present

## 2015-11-06 DIAGNOSIS — J449 Chronic obstructive pulmonary disease, unspecified: Secondary | ICD-10-CM | POA: Diagnosis not present

## 2015-11-06 DIAGNOSIS — E119 Type 2 diabetes mellitus without complications: Secondary | ICD-10-CM | POA: Insufficient documentation

## 2015-11-06 DIAGNOSIS — J45909 Unspecified asthma, uncomplicated: Secondary | ICD-10-CM | POA: Insufficient documentation

## 2015-11-06 DIAGNOSIS — I509 Heart failure, unspecified: Secondary | ICD-10-CM | POA: Diagnosis not present

## 2015-11-06 LAB — COMPREHENSIVE METABOLIC PANEL
ALBUMIN: 4 g/dL (ref 3.5–5.0)
ALT: 16 U/L (ref 14–54)
ANION GAP: 8 (ref 5–15)
AST: 20 U/L (ref 15–41)
Alkaline Phosphatase: 116 U/L (ref 38–126)
BUN: 15 mg/dL (ref 6–20)
CALCIUM: 9.5 mg/dL (ref 8.9–10.3)
CO2: 31 mmol/L (ref 22–32)
Chloride: 101 mmol/L (ref 101–111)
Creatinine, Ser: 0.94 mg/dL (ref 0.44–1.00)
GFR calc non Af Amer: 58 mL/min — ABNORMAL LOW (ref >60)
GLUCOSE: 124 mg/dL — AB (ref 65–99)
POTASSIUM: 3.9 mmol/L (ref 3.5–5.1)
SODIUM: 140 mmol/L (ref 135–145)
TOTAL PROTEIN: 8.2 g/dL — AB (ref 6.5–8.1)
Total Bilirubin: 0.3 mg/dL (ref 0.3–1.2)

## 2015-11-06 LAB — CBC WITH DIFFERENTIAL/PLATELET
BASOS ABS: 0 10*3/uL (ref 0–0.1)
BASOS PCT: 1 %
Eosinophils Absolute: 0.2 10*3/uL (ref 0–0.7)
Eosinophils Relative: 2 %
HEMATOCRIT: 38.7 % (ref 35.0–47.0)
HEMOGLOBIN: 12.4 g/dL (ref 12.0–16.0)
LYMPHS PCT: 27 %
Lymphs Abs: 2.5 10*3/uL (ref 1.0–3.6)
MCH: 25.6 pg — ABNORMAL LOW (ref 26.0–34.0)
MCHC: 32 g/dL (ref 32.0–36.0)
MCV: 80 fL (ref 80.0–100.0)
MONO ABS: 0.7 10*3/uL (ref 0.2–0.9)
MONOS PCT: 7 %
NEUTROS ABS: 5.9 10*3/uL (ref 1.4–6.5)
NEUTROS PCT: 63 %
Platelets: 195 10*3/uL (ref 150–440)
RBC: 4.84 MIL/uL (ref 3.80–5.20)
RDW: 15.4 % — AB (ref 11.5–14.5)
WBC: 9.4 10*3/uL (ref 3.6–11.0)

## 2015-11-06 LAB — GLUCOSE, CAPILLARY: Glucose-Capillary: 117 mg/dL — ABNORMAL HIGH (ref 65–99)

## 2015-11-06 MED ORDER — FLUCONAZOLE 150 MG PO TABS: 150.0000 mg | ORAL_TABLET | Freq: Every day | ORAL | Status: DC

## 2015-11-06 MED ORDER — NYSTATIN 100000 UNIT/GM EX CREA: 1.0000 "application " | TOPICAL_CREAM | Freq: Two times a day (BID) | CUTANEOUS | Status: DC

## 2015-11-06 NOTE — ED Provider Notes (Signed)
[facility] Emergency Department Provider Note  ____________________________________________  Time seen: Approximately 12:37 PM  I have reviewed the triage vital signs and the nursing notes.   HISTORY  Chief Complaint Abscess   HPI Kristen Watts is a 75 y.o. female is here to be seen for "multiple abscesses". Patient states that currently she blew she has blood poisoning and she wants blood drawn today while her body is "being poisoned ". Patient states that she has not seen a doctor in a year and a half because she was discharged [facility]. She states that she has paperwork into [facility] but has been unable to get an appointment for 8 months. Patient denies any fever, chills, nausea or vomiting. Patient has continued her normal activity. Patient continues to eat and drink and is ambulatory. Patient gives a history of being diabetic in the past but currently is not on any medication as she was told now she is not diabetic. Currently she rates her pain as 5/10.  Patient also states that she put a pair of panty hose which started her skin infection several years ago.   Past Medical History  Diagnosis Date  . Arthritis   . Asthma   . Blood transfusion without reported diagnosis   . CHF (congestive heart failure) (HCC)   . COPD (chronic obstructive pulmonary disease) (HCC)   . Coronary artery disease   . Diabetes mellitus without complication (HCC)   . Hypertension     There are no active problems to display for this patient.   Past Surgical History  Procedure Laterality Date  . Back surgery    . Breast surgery    . Cholecystectomy    . Fracture surgery    . Cardiac surgery    . Abdominal hysterectomy    . Tonsillectomy      Current Outpatient Rx  Name  Route  Sig  Dispense  Refill  . EXPIRED: citalopram (CELEXA) 10 MG tablet   Oral   Take 1 tablet (10 mg total) by mouth daily.   30 tablet   2   . fluconazole (DIFLUCAN) 150 MG  tablet   Oral   Take 1 tablet (150 mg total) by mouth daily.   1 tablet   0   . nystatin cream (MYCOSTATIN)   Topical   Apply 1 application topically 2 (two) times daily.   60 g   1   . EXPIRED: risperiDONE (RISPERDAL) 1 MG tablet   Oral   Take 1 tablet (1 mg total) by mouth at bedtime.   30 tablet   1     Allergies Demerol  No family history on file.  Social History Social History  Substance Use Topics  . Smoking status: Never Smoker   . Smokeless tobacco: None  . Alcohol Use: No    Review of Systems Constitutional: No fever/chills Eyes: No visual changes. ENT: No sore throat. Cardiovascular: Denies chest pain. Respiratory: Denies shortness of breath. Gastrointestinal: No abdominal pain.  No nausea, no vomiting.  No diarrhea.  No constipation. Genitourinary: Negative for dysuria. Musculoskeletal: Negative for back pain. Skin: Positive for rash Neurological: Negative for headaches, focal weakness or numbness.  10-point ROS otherwise negative.  ____________________________________________   PHYSICAL EXAM:  VITAL SIGNS: ED Triage Vitals  Enc Vitals Group     BP 11/06/15 1200 128/64 mmHg     Pulse Rate 11/06/15 1200 84     Resp 11/06/15 1200 20  Temp 11/06/15 1200 98 F (36.7 C)     Temp Source 11/06/15 1200 Oral     SpO2 11/06/15 1200 96 %     Weight --      Height --      Head Cir --      Peak Flow --      Pain Score 11/06/15 1202 5     Pain Loc --      Pain Edu? --      Excl. in GC? --     Constitutional: Alert and oriented. Well appearing and in no acute distress. Eyes: Conjunctivae are normal. PERRL. EOMI. Head: Atraumatic. Nose: No congestion/rhinnorhea. Neck: No stridor.   Cardiovascular: Normal rate, regular rhythm. Grossly normal heart sounds.  Good peripheral circulation. Respiratory: Normal respiratory effort.  No retractions. Lungs CTAB. Musculoskeletal: Moves upper and lower extremities without any difficulty. Normal gait was  noted in the exam room. Neurologic:  Normal speech and language. No gross focal neurologic deficits are appreciated. No gait instability. Skin:  Skin is warm, dry and intact. There is erythematous skin under both breasts which are hanging down over the skin involved. There is also abdominal skin that is folded over and upon lifting it erythematous areas are present without satellite lesions seen. Patient also has erythema on the upper thigh anteriorly. No drainage is seen. All areas are representative of a monilial skin infection. Psychiatric: Mood and affect are normal. Speech and behavior are normal.  ____________________________________________   LABS (all labs ordered are listed, but only abnormal results are displayed)  Labs Reviewed  COMPREHENSIVE METABOLIC PANEL - Abnormal; Notable for the following:    Glucose, Bld 124 (*)    Total Protein 8.2 (*)    GFR calc non Af Amer 58 (*)    All other components within normal limits  CBC WITH DIFFERENTIAL/PLATELET - Abnormal; Notable for the following:    MCH 25.6 (*)    RDW 15.4 (*)    All other components within normal limits  GLUCOSE, CAPILLARY - Abnormal; Notable for the following:    Glucose-Capillary 117 (*)    All other components within normal limits  CBG MONITORING, ED     PROCEDURES  Procedure(s) performed: None  Critical Care performed: No  ____________________________________________   INITIAL IMPRESSION / ASSESSMENT AND PLAN / ED COURSE  Pertinent labs & imaging results that were available during my care of the patient were reviewed by me and considered in my medical decision making (see chart for details).  Attempts were made to make the patient an appointment however patient's information at [facility] is an active. Patient had information and was on file prior to her inactivity. Patient reportedly was a no-show in March 2017 and then again back in November 2016. Patient is aware that she will need go by the  office building and fill out paperwork including bringing a picture ID.  Patient is given a prescription for Diflucan 1 tablet once along with nystatin cream 1 application 2 areas twice a day 60 g with 1 refill. ____________________________________________   FINAL CLINICAL IMPRESSION(S) / ED DIAGNOSES  Final diagnoses:  Moniliasis, cutaneous      NEW MEDICATIONS STARTED DURING THIS VISIT:  New Prescriptions   FLUCONAZOLE (DIFLUCAN) 150 MG TABLET    Take 1 tablet (150 mg total) by mouth daily.   NYSTATIN CREAM (MYCOSTATIN)    Apply 1 application topically 2 (two) times daily.     Note:  This document was prepared using Dragon  voice recognition software and may include unintentional dictation errors.    Tommi Rumps, PA-C 11/06/15 1557  Arnaldo Natal, MD 11/06/15 [phone]

## 2015-11-06 NOTE — Discharge Instructions (Signed)
Cutaneous Candidiasis Cutaneous candidiasis is a condition in which there is an overgrowth of yeast (candida) on the skin. Yeast normally live on the skin, but in small enough numbers not to cause any symptoms. In certain cases, increased growth of the yeast may cause an actual yeast infection. This kind of infection usually occurs in areas of the skin that are constantly warm and moist, such as the armpits or the groin. Yeast is the most common cause of diaper rash in babies and in people who cannot control their bowel movements (incontinence). CAUSES  The fungus that most often causes cutaneous candidiasis is Candida albicans. Conditions that can increase the risk of getting a yeast infection of the skin include:  Obesity.  Pregnancy.  Diabetes.  Taking antibiotic medicine.  Taking birth control pills.  Taking steroid medicines.  Thyroid disease.  An iron or zinc deficiency.  Problems with the immune system. SYMPTOMS   Red, swollen area of the skin.  Bumps on the skin.  Itchiness. DIAGNOSIS  The diagnosis of cutaneous candidiasis is usually based on its appearance. Light scrapings of the skin may also be taken and viewed under a microscope to identify the presence of yeast. TREATMENT  Antifungal creams may be applied to the infected skin. In severe cases, oral medicines may be needed.  HOME CARE INSTRUCTIONS   Keep your skin clean and dry.  Maintain a healthy weight.  If you have diabetes, keep your blood sugar under control. SEEK IMMEDIATE MEDICAL CARE IF:  Your rash continues to spread despite treatment.  You have a fever, chills, or abdominal pain.   This information is not intended to replace advice given to you by your health care provider. Make sure you discuss any questions you have with your health care provider.   Document Released: 02/02/2011 Document Revised: 08/09/2011 Document Reviewed: 11/18/2014 Elsevier Interactive Patient Education 2016 [employer]    You need to go to [facility] and fill out paperwork in order for you to become a patient. Currently your information is an active. Diflucan 150 mg once by mouth. Nystatin cream apply to areas twice a day and allow areas to drive completely. Also when bathing make sure that these areas are completely dry before getting dressed.

## 2015-11-06 NOTE — ED Notes (Signed)
Pt wants to be seen for multiple abscesses.

## 2015-11-06 NOTE — ED Notes (Signed)
Pt. Verbalizes understanding of d/c instructions, prescriptions, and follow-up. VS stable. Pt. Ambulatory out of the unit with steady gait in NAD at time of d/c and denies concerns.

## 2015-11-15 ENCOUNTER — Encounter: Payer: Self-pay | Admitting: Emergency Medicine

## 2015-11-15 ENCOUNTER — Emergency Department
Admission: EM | Admit: 2015-11-15 | Discharge: 2015-11-15 | Disposition: A | Discharge status: Home / Self Care | Payer: Medicare Other | Attending: Emergency Medicine | Admitting: Emergency Medicine

## 2015-11-15 DIAGNOSIS — I2510 Atherosclerotic heart disease of native coronary artery without angina pectoris: Secondary | ICD-10-CM | POA: Insufficient documentation

## 2015-11-15 DIAGNOSIS — E119 Type 2 diabetes mellitus without complications: Secondary | ICD-10-CM | POA: Insufficient documentation

## 2015-11-15 DIAGNOSIS — L02211 Cutaneous abscess of abdominal wall: Secondary | ICD-10-CM | POA: Insufficient documentation

## 2015-11-15 DIAGNOSIS — M1990 Unspecified osteoarthritis, unspecified site: Secondary | ICD-10-CM | POA: Insufficient documentation

## 2015-11-15 DIAGNOSIS — J449 Chronic obstructive pulmonary disease, unspecified: Secondary | ICD-10-CM | POA: Insufficient documentation

## 2015-11-15 DIAGNOSIS — L989 Disorder of the skin and subcutaneous tissue, unspecified: Secondary | ICD-10-CM | POA: Diagnosis present

## 2015-11-15 DIAGNOSIS — J45909 Unspecified asthma, uncomplicated: Secondary | ICD-10-CM | POA: Insufficient documentation

## 2015-11-15 DIAGNOSIS — I110 Hypertensive heart disease with heart failure: Secondary | ICD-10-CM | POA: Diagnosis not present

## 2015-11-15 DIAGNOSIS — I509 Heart failure, unspecified: Secondary | ICD-10-CM | POA: Insufficient documentation

## 2015-11-15 DIAGNOSIS — L0291 Cutaneous abscess, unspecified: Secondary | ICD-10-CM

## 2015-11-15 DIAGNOSIS — L02213 Cutaneous abscess of chest wall: Secondary | ICD-10-CM | POA: Insufficient documentation

## 2015-11-15 LAB — CBC WITH DIFFERENTIAL/PLATELET
BASOS ABS: 0 10*3/uL (ref 0–0.1)
BASOS PCT: 1 %
EOS ABS: 0.3 10*3/uL (ref 0–0.7)
EOS PCT: 3 %
HCT: 35.8 % (ref 35.0–47.0)
Hemoglobin: 11.8 g/dL — ABNORMAL LOW (ref 12.0–16.0)
Lymphocytes Relative: 22 %
Lymphs Abs: 2.1 10*3/uL (ref 1.0–3.6)
MCH: 26.3 pg (ref 26.0–34.0)
MCHC: 33 g/dL (ref 32.0–36.0)
MCV: 79.5 fL — ABNORMAL LOW (ref 80.0–100.0)
Monocytes Absolute: 0.8 10*3/uL (ref 0.2–0.9)
Monocytes Relative: 9 %
Neutro Abs: 6.2 10*3/uL (ref 1.4–6.5)
Neutrophils Relative %: 65 %
PLATELETS: 196 10*3/uL (ref 150–440)
RBC: 4.51 MIL/uL (ref 3.80–5.20)
RDW: 14.8 % — ABNORMAL HIGH (ref 11.5–14.5)
WBC: 9.4 10*3/uL (ref 3.6–11.0)

## 2015-11-15 MED ORDER — IBUPROFEN 600 MG PO TABS: 600.0000 mg | ORAL_TABLET | Freq: Three times a day (TID) | ORAL | Status: DC | PRN

## 2015-11-15 MED ORDER — SULFAMETHOXAZOLE-TRIMETHOPRIM 800-160 MG PO TABS: 1.0000 | ORAL_TABLET | Freq: Two times a day (BID) | ORAL | Status: DC

## 2015-11-15 MED ORDER — CLINDAMYCIN PHOSPHATE 600 MG/50ML IV SOLN
600.0000 mg | Freq: Once | INTRAVENOUS | Status: AC
  Administered 2015-11-15: 600 mg via INTRAVENOUS
  Filled 2015-11-15: qty 50

## 2015-11-15 NOTE — Discharge Instructions (Signed)
Begin applying warm moist compresses to your chin frequently. Begin taking antibiotics and take ibuprofen as needed for pain and inflammation. Call both skin centers that were listed on your paperwork to see if they can give you an appointment.

## 2015-11-15 NOTE — ED Provider Notes (Signed)
[facility] Emergency Department Provider Note   ____________________________________________  Time seen: Approximately 12:49 PM  I have reviewed the triage vital signs and the nursing notes.   HISTORY  Chief Complaint Wound Check   HPI Kristen Watts is a 75 y.o. female is here with complaint of multiple "sores" on her face and trunk. Patient was seen on 11/06/15 for the same and was told to follow up with [facility]. Patient states that she went there the next day and was told that she would not be seen until August. Patient now has 3 more areas that are red and tender. There is one on her chin which is very painful. Patient states she did take the antibiotics as prescribed when she was seen in the emergency room on her last visit. She is unaware of any fever or chills. She's had no nausea or vomiting.Currently her pain is an 8/10.   Past Medical History  Diagnosis Date  . Arthritis   . Asthma   . Blood transfusion without reported diagnosis   . CHF (congestive heart failure) (HCC)   . COPD (chronic obstructive pulmonary disease) (HCC)   . Coronary artery disease   . Diabetes mellitus without complication (HCC)   . Hypertension     There are no active problems to display for this patient.   Past Surgical History  Procedure Laterality Date  . Back surgery    . Breast surgery    . Cholecystectomy    . Fracture surgery    . Cardiac surgery    . Abdominal hysterectomy    . Tonsillectomy      Current Outpatient Rx  Name  Route  Sig  Dispense  Refill  . EXPIRED: citalopram (CELEXA) 10 MG tablet   Oral   Take 1 tablet (10 mg total) by mouth daily.   30 tablet   2   . fluconazole (DIFLUCAN) 150 MG tablet   Oral   Take 1 tablet (150 mg total) by mouth daily.   1 tablet   0   . ibuprofen (ADVIL,MOTRIN) 600 MG tablet   Oral   Take 1 tablet (600 mg total) by mouth every 8 (eight) hours as needed.   30 tablet   0   . nystatin  cream (MYCOSTATIN)   Topical   Apply 1 application topically 2 (two) times daily.   60 g   1   . EXPIRED: risperiDONE (RISPERDAL) 1 MG tablet   Oral   Take 1 tablet (1 mg total) by mouth at bedtime.   30 tablet   1   . sulfamethoxazole-trimethoprim (BACTRIM DS,SEPTRA DS) 800-160 MG tablet   Oral   Take 1 tablet by mouth 2 (two) times daily.   20 tablet   0     Allergies Stadol; Darvon; Demerol; and Latex  No family history on file.  Social History Social History  Substance Use Topics  . Smoking status: Never Smoker   . Smokeless tobacco: None  . Alcohol Use: No    Review of Systems Constitutional: No fever/chills ENT: No sore throat. Cardiovascular: Denies chest pain. Respiratory: Denies shortness of breath. Gastrointestinal:  No nausea, no vomiting.  Musculoskeletal: Negative for Body aches Skin: Multiple erythematous lesions present. Neurological: Negative for headaches, focal weakness or numbness.  10-point ROS otherwise negative.  ____________________________________________   PHYSICAL EXAM:  VITAL SIGNS: ED Triage Vitals  Enc Vitals Group     BP 11/15/15 1227 130/52 mmHg  Pulse Rate 11/15/15 1227 80     Resp 11/15/15 1227 18     Temp 11/15/15 1227 97.7 F (36.5 C)     Temp Source 11/15/15 1227 Oral     SpO2 11/15/15 1227 94 %     Weight 11/15/15 1227 180 lb (81.647 kg)     Height 11/15/15 1227 5' (1.524 m)     Head Cir --      Peak Flow --      Pain Score 11/15/15 1228 8     Pain Loc --      Pain Edu? --      Excl. in GC? --     Constitutional: Alert and oriented. Well appearing and in no acute distress. Eyes: Conjunctivae are normal. PERRL. EOMI. Head: Atraumatic. Nose: No congestion/rhinnorhea. Neck: No stridor.   Hematological/Lymphatic/Immunilogical: No cervical lymphadenopathy. Cardiovascular: Normal rate, regular rhythm. Grossly normal heart sounds.   Respiratory: Normal respiratory effort.  No retractions. Lungs  CTAB. Musculoskeletal: No lower extremity tenderness nor edema.  No joint effusions. Neurologic:  Normal speech and language. No gross focal neurologic deficits are appreciated. No gait instability. Skin:  Currently there are 3 erythematous lesions. There are 2 on the torso, anterior abdomen and chest that are localized without extending cellulitis. There is one on the chin area that is extremely tender to touch, warm, erythematous without active drainage. Psychiatric: Mood and affect are normal. Speech and behavior are normal.  ____________________________________________   LABS (all labs ordered are listed, but only abnormal results are displayed)  Labs Reviewed  CBC WITH DIFFERENTIAL/PLATELET - Abnormal; Notable for the following:    Hemoglobin 11.8 (*)    MCV 79.5 (*)    RDW 14.8 (*)    All other components within normal limits     PROCEDURES  Procedure(s) performed: None  Critical Care performed: No  ____________________________________________   INITIAL IMPRESSION / ASSESSMENT AND PLAN / ED COURSE  Pertinent labs & imaging results that were available during my care of the patient were reviewed by me and considered in my medical decision making (see chart for details).  Patient was given a prescription for Septra DS and ibuprofen to begin taking today. She is also instructed to use warm compresses especially to her chin as there is no fluctuant area to I&D today. Patient was given the name of 2 dermatologist in [place] to make an appointment with as she cannot be seen at [facility] until August. She is encouraged to call both of these offices on Monday to make an appointment. ____________________________________________   FINAL CLINICAL IMPRESSION(S) / ED DIAGNOSES  Final diagnoses:  Abscess of multiple sites      NEW MEDICATIONS STARTED DURING THIS VISIT:  New Prescriptions   IBUPROFEN (ADVIL,MOTRIN) 600 MG TABLET    Take 1 tablet (600 mg total) by mouth  every 8 (eight) hours as needed.   SULFAMETHOXAZOLE-TRIMETHOPRIM (BACTRIM DS,SEPTRA DS) 800-160 MG TABLET    Take 1 tablet by mouth 2 (two) times daily.     Note:  This document was prepared using Dragon voice recognition software and may include unintentional dictation errors.    Tommi Rumps, PA-C 11/15/15 1426  Jennye Moccasin, MD 11/15/15 1455

## 2015-11-15 NOTE — ED Notes (Signed)
Scattered sores noted face and trunk. States was seen and given prescription but states feels she is getting more spots. Appearance is like insect bites but states has not been bitten by anything.

## 2015-11-15 NOTE — ED Notes (Signed)
Pt ambulatory to BR at this time w/ no difficulty or distress noted.

## 2016-03-01 ENCOUNTER — Other Ambulatory Visit: Payer: Self-pay | Admitting: Internal Medicine

## 2016-03-01 DIAGNOSIS — Z1231 Encounter for screening mammogram for malignant neoplasm of breast: Secondary | ICD-10-CM

## 2016-03-16 ENCOUNTER — Encounter: Payer: Self-pay | Admitting: Podiatry

## 2016-03-16 ENCOUNTER — Ambulatory Visit (INDEPENDENT_AMBULATORY_CARE_PROVIDER_SITE_OTHER): Payer: Medicare Other

## 2016-03-16 ENCOUNTER — Ambulatory Visit (INDEPENDENT_AMBULATORY_CARE_PROVIDER_SITE_OTHER): Payer: Medicare Other | Admitting: Podiatry

## 2016-03-16 VITALS — BP 122/66 | HR 73 | Resp 20

## 2016-03-16 DIAGNOSIS — I739 Peripheral vascular disease, unspecified: Secondary | ICD-10-CM

## 2016-03-16 DIAGNOSIS — M659 Synovitis and tenosynovitis, unspecified: Secondary | ICD-10-CM | POA: Diagnosis not present

## 2016-03-16 DIAGNOSIS — M2041 Other hammer toe(s) (acquired), right foot: Secondary | ICD-10-CM | POA: Diagnosis not present

## 2016-03-16 DIAGNOSIS — M79671 Pain in right foot: Secondary | ICD-10-CM

## 2016-03-16 DIAGNOSIS — G63 Polyneuropathy in diseases classified elsewhere: Secondary | ICD-10-CM | POA: Diagnosis not present

## 2016-03-16 DIAGNOSIS — M205X9 Other deformities of toe(s) (acquired), unspecified foot: Secondary | ICD-10-CM | POA: Diagnosis not present

## 2016-03-16 DIAGNOSIS — M2042 Other hammer toe(s) (acquired), left foot: Secondary | ICD-10-CM | POA: Diagnosis not present

## 2016-03-16 DIAGNOSIS — M25571 Pain in right ankle and joints of right foot: Secondary | ICD-10-CM

## 2016-03-16 DIAGNOSIS — M7751 Other enthesopathy of right foot: Secondary | ICD-10-CM

## 2016-03-16 DIAGNOSIS — M65971 Unspecified synovitis and tenosynovitis, right ankle and foot: Secondary | ICD-10-CM

## 2016-03-16 NOTE — Progress Notes (Signed)
   Subjective:    Patient ID: Kristen Watts, female    DOB: 09/28/1940, 75 y.o.   MRN: [id number]  HPI    Review of Systems  Constitutional: Positive for activity change and fatigue.  HENT: Positive for hearing loss, sneezing, tinnitus and trouble swallowing.   Eyes: Positive for pain and redness.  Respiratory: Positive for cough, chest tightness, shortness of breath and wheezing.   Cardiovascular: Positive for leg swelling.  Endocrine: Positive for heat intolerance and polydipsia.  Musculoskeletal: Positive for back pain.  Skin: Positive for rash.  Hematological: Bruises/bleeds easily.  Psychiatric/Behavioral: Positive for confusion.  All other systems reviewed and are negative.      Objective:   Physical Exam        Assessment & Plan:

## 2016-03-21 MED ORDER — BETAMETHASONE SOD PHOS & ACET 6 (3-3) MG/ML IJ SUSP: 3.0000 mg | Freq: Once | INTRAMUSCULAR | Status: DC

## 2016-03-21 NOTE — Progress Notes (Signed)
Patient ID: Kristen Watts, female   DOB: 10/02/1940, 75 y.o.   MRN: [id number] Subjective:  Diabetic patient presents today for a chronic right foot pain and swelling. Patient states that she is a diabetic with a history of 27 surgeries. Patient presents today for further treatment and evaluation    Objective/Physical Exam General: The patient is alert and oriented x3 in no acute distress.  Dermatology: Skin is warm, dry and supple bilateral lower extremities. Negative for open lesions or macerations.  Vascular: Diminished pedal pulses bilaterally. Extremities are cool to touch. Moderate edema or erythema noted.   Neurological: Epicritic and protective threshold grossly intact bilaterally.   Musculoskeletal Exam: Significant pain on palpation to the anterior lateral medial aspects of the right ankle with moderate edema.  Clinical evidence of hammertoe contracture digits 2 through 5 bilateral. Limited range of motion to the first MPJ bilateral. All other range of motion within normal limits to all pedal and ankle joints bilateral. Muscle strength 5/5 in all groups bilateral.  Clinical evidence of cavus foot deformity bilateral.  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 #2 injection 0.5 mL Celestone Soluspan injected in the patient's right ankle joint without incident #3 silicone toe cap dispensed for bilateral second digit hammertoes which are painful to alleviate symptoms #4 recommend conservative modalities such as vionic sandals and supportive shoe gear #5 paperwork authorization for diabetic shoes with diabetic inserts was initiated #6 return to clinic for diabetic shoe fitting   Dr. Felecia ShellingBrent  M. Ladarrius Bogdanski, DPM [facility]

## 2016-03-23 ENCOUNTER — Ambulatory Visit
Admission: RE | Admit: 2016-03-23 | Discharge: 2016-03-23 | Disposition: A | Discharge status: Home / Self Care | Payer: Medicare Other | Source: Ambulatory Visit | Attending: Internal Medicine | Admitting: Internal Medicine

## 2016-03-23 DIAGNOSIS — Z1231 Encounter for screening mammogram for malignant neoplasm of breast: Secondary | ICD-10-CM | POA: Diagnosis not present

## 2016-05-04 ENCOUNTER — Emergency Department: Payer: Medicare Other

## 2016-05-04 ENCOUNTER — Emergency Department
Admission: EM | Admit: 2016-05-04 | Discharge: 2016-05-04 | Disposition: A | Discharge status: Home / Self Care | Payer: Medicare Other | Attending: Emergency Medicine | Admitting: Emergency Medicine

## 2016-05-04 DIAGNOSIS — Z7984 Long term (current) use of oral hypoglycemic drugs: Secondary | ICD-10-CM | POA: Insufficient documentation

## 2016-05-04 DIAGNOSIS — I110 Hypertensive heart disease with heart failure: Secondary | ICD-10-CM | POA: Insufficient documentation

## 2016-05-04 DIAGNOSIS — I509 Heart failure, unspecified: Secondary | ICD-10-CM | POA: Insufficient documentation

## 2016-05-04 DIAGNOSIS — Z87898 Personal history of other specified conditions: Secondary | ICD-10-CM

## 2016-05-04 DIAGNOSIS — I2510 Atherosclerotic heart disease of native coronary artery without angina pectoris: Secondary | ICD-10-CM | POA: Insufficient documentation

## 2016-05-04 DIAGNOSIS — J40 Bronchitis, not specified as acute or chronic: Secondary | ICD-10-CM | POA: Insufficient documentation

## 2016-05-04 DIAGNOSIS — Z791 Long term (current) use of non-steroidal anti-inflammatories (NSAID): Secondary | ICD-10-CM | POA: Insufficient documentation

## 2016-05-04 DIAGNOSIS — Z91040 Latex allergy status: Secondary | ICD-10-CM | POA: Diagnosis not present

## 2016-05-04 DIAGNOSIS — J45909 Unspecified asthma, uncomplicated: Secondary | ICD-10-CM | POA: Diagnosis not present

## 2016-05-04 DIAGNOSIS — J449 Chronic obstructive pulmonary disease, unspecified: Secondary | ICD-10-CM | POA: Insufficient documentation

## 2016-05-04 DIAGNOSIS — E119 Type 2 diabetes mellitus without complications: Secondary | ICD-10-CM | POA: Insufficient documentation

## 2016-05-04 DIAGNOSIS — R8299 Other abnormal findings in urine: Secondary | ICD-10-CM | POA: Diagnosis not present

## 2016-05-04 DIAGNOSIS — Z79899 Other long term (current) drug therapy: Secondary | ICD-10-CM | POA: Diagnosis not present

## 2016-05-04 DIAGNOSIS — R0602 Shortness of breath: Secondary | ICD-10-CM

## 2016-05-04 DIAGNOSIS — R079 Chest pain, unspecified: Secondary | ICD-10-CM | POA: Diagnosis present

## 2016-05-04 DIAGNOSIS — Z87448 Personal history of other diseases of urinary system: Secondary | ICD-10-CM

## 2016-05-04 LAB — URINALYSIS, COMPLETE (UACMP) WITH MICROSCOPIC
BACTERIA UA: NONE SEEN
BILIRUBIN URINE: NEGATIVE
Glucose, UA: NEGATIVE mg/dL
KETONES UR: NEGATIVE mg/dL
LEUKOCYTES UA: NEGATIVE
NITRITE: NEGATIVE
Protein, ur: NEGATIVE mg/dL
SPECIFIC GRAVITY, URINE: 1.01 (ref 1.005–1.030)
WBC UA: NONE SEEN WBC/hpf (ref 0–5)
pH: 7 (ref 5.0–8.0)

## 2016-05-04 LAB — CBC
HCT: 35.8 % (ref 35.0–47.0)
Hemoglobin: 11.7 g/dL — ABNORMAL LOW (ref 12.0–16.0)
MCH: 26.9 pg (ref 26.0–34.0)
MCHC: 32.8 g/dL (ref 32.0–36.0)
MCV: 82.1 fL (ref 80.0–100.0)
PLATELETS: 185 10*3/uL (ref 150–440)
RBC: 4.36 MIL/uL (ref 3.80–5.20)
RDW: 15 % — ABNORMAL HIGH (ref 11.5–14.5)
WBC: 8.5 10*3/uL (ref 3.6–11.0)

## 2016-05-04 LAB — BASIC METABOLIC PANEL
Anion gap: 8 (ref 5–15)
BUN: 26 mg/dL — ABNORMAL HIGH (ref 6–20)
CHLORIDE: 100 mmol/L — AB (ref 101–111)
CO2: 31 mmol/L (ref 22–32)
CREATININE: 1.15 mg/dL — AB (ref 0.44–1.00)
Calcium: 9.3 mg/dL (ref 8.9–10.3)
GFR calc non Af Amer: 46 mL/min — ABNORMAL LOW (ref >60)
GFR, EST AFRICAN AMERICAN: 53 mL/min — AB (ref >60)
GLUCOSE: 118 mg/dL — AB (ref 65–99)
Potassium: 4.1 mmol/L (ref 3.5–5.1)
Sodium: 139 mmol/L (ref 135–145)

## 2016-05-04 LAB — BLOOD GAS, VENOUS
Acid-Base Excess: 6.3 mmol/L — ABNORMAL HIGH (ref 0.0–2.0)
Bicarbonate: 31.8 mmol/L — ABNORMAL HIGH (ref 20.0–28.0)
O2 Saturation: 78.5 %
PCO2 VEN: 49 mmHg (ref 44.0–60.0)
PH VEN: 7.42 (ref 7.250–7.430)
Patient temperature: 37
pO2, Ven: 42 mmHg (ref 32.0–45.0)

## 2016-05-04 LAB — BRAIN NATRIURETIC PEPTIDE: B Natriuretic Peptide: 141 pg/mL — ABNORMAL HIGH (ref 0.0–100.0)

## 2016-05-04 LAB — TROPONIN I: Troponin I: <0.03 ng/mL (ref <0.03)

## 2016-05-04 MED ORDER — PREDNISONE 20 MG PO TABS: 60.0000 mg | ORAL_TABLET | Freq: Every day | ORAL | 0 refills | Status: DC

## 2016-05-04 MED ORDER — AZITHROMYCIN 250 MG PO TABS: ORAL_TABLET | ORAL | 0 refills | Status: AC

## 2016-05-04 MED ORDER — IOPAMIDOL (ISOVUE-370) INJECTION 76%
60.0000 mL | Freq: Once | INTRAVENOUS | Status: AC | PRN
  Administered 2016-05-04: 60 mL via INTRAVENOUS

## 2016-05-04 NOTE — ED Triage Notes (Signed)
Pt bib EMS from home w/ c/o CP.  Pt sts that she called PCP because her urine changed color in last 24 hours, stated that it became yellow w/ green overcast.  Pt sts while on phone w/ PCP, they commented on wheezing and advised her to call EMS.  While on phone w/ EMS, pt began developing CP in center of chest, pt sts that it felt like "popping".  Pt alert and oriented x 4.  Pts lung sounds diminshed and has wheezing.  Pt sts that she always has wheezing but feels like it is worse today.

## 2016-05-04 NOTE — ED Provider Notes (Signed)
[facility] Emergency Department Provider Note  ____________________________________________  Time seen: Approximately 4:37 PM  I have reviewed the triage vital signs and the nursing notes.   HISTORY  Chief Complaint Chest Pain    HPI Kristen Watts is a 75 y.o. female with a history of COPD and asthma on 2 L nasal cannula,CAD, CHF, HTN, DM presenting with urinary changes, chest pain, cough, and shortness of breath. Patient reports that since this morning, she has had a green tinge to her urine without any dysuria or urinary frequency. No suprapubic pain, fever, chills. She called her PMD's office to let them know, and over the phone she was audibly wheezing so they recommended that she call EMS for transport to the emergency department. Over the past 2-3 weeks, she has also had an increased cough reactive of green thick phlegm, with exertional dyspnea and decreased exercise tolerance. En route, the patient also describes that she developed a sensation in the center of her chest "like [place]" that would dissipate within several seconds. She also describes swelling in her right foot that is new. No palpitations, diaphoresis, nausea or vomiting, lightheadedness or syncope.   Past Medical History:  Diagnosis Date  . Arthritis   . Asthma   . Blood transfusion without reported diagnosis   . CHF (congestive heart failure) (HCC)   . COPD (chronic obstructive pulmonary disease) (HCC)   . Coronary artery disease   . Diabetes mellitus without complication (HCC)   . Hypertension     There are no active problems to display for this patient.   Past Surgical History:  Procedure Laterality Date  . ABDOMINAL HYSTERECTOMY    . BACK SURGERY    . BREAST BIOPSY Right    neg  . BREAST SURGERY    . CARDIAC SURGERY    . CHOLECYSTECTOMY    . FRACTURE SURGERY    . TONSILLECTOMY      Current Outpatient Rx  . Order #: [id number] Class: Historical Med  . Order #:  [id number] Class: Historical Med  . Order #: [id number] Class: Historical Med  . Order #: [id number] Class: Historical Med  . Order #: [id number] Class: Historical Med  . Order #: [id number] Class: Historical Med  . Order #: [id number] Class: Historical Med  . Order #: [id number] Class: Historical Med  . Order #: [id number] Class: Print  . Order #: [id number] Class: Historical Med  . Order #: [id number] Class: Historical Med  . Order #: [id number] Class: Historical Med  . Order #: [id number] Class: Print  . Order #: [id number] Class: Historical Med  . Order #: [id number] Class: Historical Med  . Order #: [id number] Class: Historical Med  . Order #: [id number] Class: Print  . Order #: [id number] Class: Print  . Order #: [id number] Class: Historical Med  . Order #: [id number] Class: Print  . Order #: [id number] Class: Print    Allergies Stadol [butorphanol]; Darvon [propoxyphene]; Demerol [meperidine]; and Latex  Family History  Problem Relation Age of Onset  . Breast cancer Neg Hx     Social History Social History  Substance Use Topics  . Smoking status: Never Smoker  . Smokeless tobacco: Never Used  . Alcohol use No    Review of Systems Constitutional: No fever/chills.No lightheadedness or syncope. Eyes: No visual changes. ENT: No sore throat. Positive congestion. Cardiovascular: Positive chest pain. Denies palpitations. Respiratory: Positive shortness of breath with decreased exercise tolerance.  Positive productive cough. Gastrointestinal: No abdominal pain.  No nausea, no vomiting.  No diarrhea.  No constipation. Genitourinary: Negative for dysuria. Positive change in urine color. Musculoskeletal:  Negative for back pain. Right lower extremity swelling. Skin: Negative for rash. Neurological: Negative for headaches. No focal numbness, tingling or weakness.   10-point ROS otherwise negative.  ____________________________________________   PHYSICAL EXAM:  VITAL SIGNS: ED Triage Vitals  Enc Vitals  Group     BP 05/04/16 1615 (!) 112/52     Pulse Rate 05/04/16 1615 76     Resp 05/04/16 1615 15     Temp 05/04/16 1618 98.3 F (36.8 C)     Temp Source 05/04/16 1618 Oral     SpO2 05/04/16 1615 98 %     Weight 05/04/16 1619 180 lb (81.6 kg)     Height --      Head Circumference --      Peak Flow --      Pain Score 05/04/16 1619 4     Pain Loc --      Pain Edu? --      Excl. in GC? --     Constitutional: Alert and oriented. Chronically ill appearing but in no acute distress. Answers questions appropriately. Eyes: Conjunctivae are normal.  EOMI. No scleral icterus. No eye discharge. Head: Atraumatic. Nose: No congestion/rhinnorhea. Mouth/Throat: Mucous membranes are mildly dry.  Neck: No stridor.  Supple.  No JVD. No meningismus. Cardiovascular: Normal rate, regular rhythm. No murmurs, rubs or gallops.  Respiratory: Normal respiratory effort.  No accessory muscle use or retractions. Lungs CTAB.  No wheezes, rales or ronchi. Increased expiratory phase with fair air exchange. On the patient's home oxygen, 2 L nasal cannula, she is satting 96% Gastrointestinal: Obese. Soft, nontender and nondistended.  No guarding or rebound.  No peritoneal signs. Musculoskeletal: Minimal bilateral lower extremity edema that is mostly present around the ankles; I do not perceive significant difference in the right and left. No ttp in the calves or palpable cords.  Negative Homan's sign. Neurologic:  A&Ox3.  Speech is clear.  Face and smile are symmetric.  EOMI.  Moves all extremities well. Skin:  Skin is warm, dry and intact. No rash noted. Psychiatric: Mood and affect are normal. Speech and behavior are normal.  Normal judgement.  ____________________________________________   LABS (all labs ordered are listed, but only abnormal results are displayed)  Labs Reviewed  BASIC METABOLIC PANEL - Abnormal; Notable for the following:       Result Value   Chloride 100 (*)    Glucose, Bld 118 (*)    BUN  26 (*)    Creatinine, Ser 1.15 (*)    GFR calc non Af Amer 46 (*)    GFR calc Af Amer 53 (*)    All other components within normal limits  CBC - Abnormal; Notable for the following:    Hemoglobin 11.7 (*)    RDW 15.0 (*)    All other components within normal limits  BRAIN NATRIURETIC PEPTIDE - Abnormal; Notable for the following:    B Natriuretic Peptide 141.0 (*)    All other components within normal limits  URINALYSIS, COMPLETE (UACMP) WITH MICROSCOPIC - Abnormal; Notable for the following:    Color, Urine YELLOW (*)    APPearance CLEAR (*)    Hgb urine dipstick SMALL (*)    Squamous Epithelial / LPF 0-5 (*)    All other components within normal limits  BLOOD GAS, VENOUS - Abnormal; Notable for the following:    Bicarbonate 31.8 (*)    Acid-Base Excess 6.3 (*)    All other components within normal limits  TROPONIN I  TROPONIN I   ____________________________________________  EKG  ED ECG REPORT I, Rockne MenghiniNorman, Anne-Caroline, the attending physician, personally viewed and interpreted this ECG.   Date: 05/04/2016  EKG Time: 1611  Rate: 80  Rhythm: normal sinus rhythm  Axis: normal  Intervals:none  ST&T Change: No ST elevation.  ____________________________________________  RADIOLOGY  Dg Chest 2 View  Result Date: 05/04/2016 CLINICAL DATA:  Chest pain.  Right leg swelling. EXAM: CHEST  2 VIEW COMPARISON:  10/19/2014 FINDINGS: Cardiac silhouette is within normal limits. Tortuosity and calcification are noted of the thoracic aorta. Subsegmental atelectasis is present in the lingula. No pleural effusion or pneumothorax is seen. No acute osseous abnormality is identified. An IVC filter is noted. IMPRESSION: Lingular atelectasis.  No evidence of pneumonia or edema. Electronically Signed   By: Sebastian AcheAllen  Grady M.D.   On: 05/04/2016 17:52   Ct Angio Chest Pe W And/or Wo Contrast  Result Date: 05/04/2016 CLINICAL DATA:  75 y/o  F; chest pain and shortness of breath. EXAM: CT  ANGIOGRAPHY CHEST WITH CONTRAST TECHNIQUE: Multidetector CT imaging of the chest was performed using the standard protocol during bolus administration of intravenous contrast. Multiplanar CT image reconstructions and MIPs were obtained to evaluate the vascular anatomy. CONTRAST:  60 cc Isovue 370 COMPARISON:  05/04/2016 chest radiograph. FINDINGS: Cardiovascular: Satisfactory opacification of the pulmonary arteries to the segmental level. No evidence of pulmonary embolism. She in normal heart size. No pericardial effusion. Moderate coronary artery calcification. Normal caliber thoracic aorta and main pulmonary artery. Mild aortic atherosclerosis with calcifications. Mediastinum/Nodes: No enlarged mediastinal, hilar, or axillary lymph nodes. Thyroid gland, trachea, and esophagus demonstrate no significant findings. Lungs/Pleura: Lungs are clear. No pleural effusion or pneumothorax. Minor atelectasis within the lung bases. Peribronchial thickening and scattered mucous plugging within the lung bases predominantly. Upper Abdomen: No acute abnormality. Musculoskeletal: No chest wall abnormality. No acute or significant osseous findings. Mild thoracic spine degenerative changes. Review of the MIP images confirms the above findings. IMPRESSION: 1. No evidence of pulmonary embolus. 2. Peribronchial thickening and scattered mucous plugging probably represents mild acute infectious/inflammatory bronchitis. Minor atelectasis within the lung bases. 3. Mild aortic atherosclerosis. 4. Moderate coronary artery calcification. Electronically Signed   By: Mitzi HansenLance  Furusawa-Stratton M.D.   On: 05/04/2016 18:10   [place] Venous Img Lower Unilateral Right  Result Date: 05/04/2016 CLINICAL DATA:  75 y/o F; shortness of breath with right lower extremity swelling. EXAM: RIGHT LOWER EXTREMITY VENOUS DOPPLER ULTRASOUND TECHNIQUE: Gray-scale sonography with graded compression, as well as color Doppler and duplex ultrasound were performed to  evaluate the lower extremity deep venous systems from the level of the common femoral vein and including the common femoral, femoral, profunda femoral, popliteal and calf veins including the posterior tibial, peroneal and gastrocnemius veins when visible. The superficial great saphenous vein was also interrogated. Spectral Doppler was utilized to evaluate flow at rest and with distal augmentation maneuvers in the common femoral, femoral and popliteal veins. COMPARISON:  Right lower extremity venous Doppler dated 02/17/2010. FINDINGS: Contralateral Common Femoral Vein: Respiratory phasicity is normal and symmetric with the symptomatic side. No evidence of thrombus. Normal compressibility. Common Femoral Vein: No evidence of thrombus. Normal compressibility, respiratory phasicity and response to augmentation. Saphenofemoral Junction: No evidence of thrombus. Normal compressibility and flow on color Doppler imaging. Profunda Femoral Vein: No evidence of thrombus. Normal compressibility and flow on color Doppler imaging. Femoral Vein: No evidence of thrombus. Normal compressibility, respiratory phasicity and response to augmentation. Popliteal Vein: No evidence of thrombus. Normal  compressibility, respiratory phasicity and response to augmentation. Calf Veins: No evidence of thrombus. Normal compressibility and flow on color Doppler imaging. Superficial Great Saphenous Vein: No evidence of thrombus. Normal compressibility and flow on color Doppler imaging. Venous Reflux:  None. Other Findings:  None. IMPRESSION: No evidence of deep venous thrombosis. Electronically Signed   By: Mitzi Hansen M.D.   On: 05/04/2016 17:47    ____________________________________________   PROCEDURES  Procedure(s) performed: None  Procedures  Critical Care performed: No ____________________________________________   INITIAL IMPRESSION / ASSESSMENT AND PLAN / ED COURSE  Pertinent labs & imaging results that were  available during my care of the patient were reviewed by me and considered in my medical decision making (see chart for details).  75 y.o. female with a history of COPD and asthma on 2 L nasal cannula baseline, CAD, presenting with multiple complaints including urinary changes, chest pain, shortness breath, right lower extremity swelling. We'll get a urinalysis to evaluate for UTI and check a creatinine. For the patient's chest pain and shortness of breath, we'll get a troponin, BNP, and CT her chest to rule out PE. Her EKG does not have any ischemic changes. Plan reevaluation for final disposition.  ----------------------------------------- 10:16 PM on 05/04/2016 -----------------------------------------  In the emergency department, the patient's workup was reassuring. Her BNP was only in the 140s, and she did not have a significant amount of pulmonary edema on her chest x-ray. She does have some possible infectious bronchitis on her CT angiogram, so we'll treat her with azithromycin and steroids. The patient has remained hemodynamically stable, and is safe for discharge. She understands return precautions as well as follow-up instructions per ____________________________________________  FINAL CLINICAL IMPRESSION(S) / ED DIAGNOSES  Final diagnoses:  Bronchitis  Chest pain, unspecified type  Shortness of breath  History of urine color changes    Clinical Course as of May 04 2216  Tue May 04, 2016  0981 The patient has no evidence of DVT on her ultrasound. She has signs and symptoms consistent with acute infection or bronchitis on her CT angiogram.  [AN]    Clinical Course User Index [AN] Rockne Menghini, MD      NEW MEDICATIONS STARTED DURING THIS VISIT:  New Prescriptions   AZITHROMYCIN (ZITHROMAX Z-PAK) 250 MG TABLET    Take 2 tablets (500 mg) on  Day 1,  followed by 1 tablet (250 mg) once daily on Days 2 through 5.   PREDNISONE (DELTASONE) 20 MG TABLET    Take 3 tablets  (60 mg total) by mouth daily.      Rockne Menghini, MD 05/04/16 2217

## 2016-05-04 NOTE — ED Notes (Addendum)
Pt sts that she does not have keys to apartment.  Apartment complex called, on call site manager stated that he would unlocked pts apartment.  Pts friend called and confirmed pick up.

## 2016-05-04 NOTE — Discharge Instructions (Signed)
Please take the entire course of antibiotics (ZPak) and steroids (prednisone) to help with your bronchitis.  Keep your appointment with Dr. Meredeth IdeFleming on Thursday.  Return to the emergency department if you develop pain, shortness of breath, palpitations, lightheadedness or fainting, fever, or any other symptoms concerning to you.

## 2016-05-04 NOTE — ED Notes (Signed)
Patient transported to CT 

## 2016-05-04 NOTE — ED Notes (Signed)
Pt returned from CT °

## 2016-05-04 NOTE — ED Notes (Signed)
Patient transported to X-ray 

## 2016-05-04 NOTE — ED Notes (Signed)
ED Provider at bedside. 

## 2016-06-02 ENCOUNTER — Other Ambulatory Visit: Payer: Medicare Other

## 2016-06-03 ENCOUNTER — Telehealth: Payer: Self-pay | Admitting: Podiatry

## 2016-06-03 NOTE — Telephone Encounter (Signed)
Pt. Called to cancel her appt. She said the Nurse came out and told her to stay off her feet because they are swelling . I told pt. Her appt. Was for us to check the swelling in her feet, but she said that she was going to stay off her feet.

## 2016-06-04 ENCOUNTER — Ambulatory Visit: Payer: Medicare Other | Admitting: Podiatry

## 2016-07-01 ENCOUNTER — Emergency Department: Payer: Medicare Other

## 2016-07-01 ENCOUNTER — Encounter: Payer: Self-pay | Admitting: Emergency Medicine

## 2016-07-01 ENCOUNTER — Inpatient Hospital Stay
Admission: EM | Admit: 2016-07-01 | Discharge: 2016-07-02 | DRG: 641 | Disposition: A | Discharge status: Home / Self Care | Payer: Medicare Other | Attending: Internal Medicine | Admitting: Internal Medicine

## 2016-07-01 DIAGNOSIS — I10 Essential (primary) hypertension: Secondary | ICD-10-CM | POA: Diagnosis present

## 2016-07-01 DIAGNOSIS — F419 Anxiety disorder, unspecified: Secondary | ICD-10-CM | POA: Diagnosis present

## 2016-07-01 DIAGNOSIS — Z9981 Dependence on supplemental oxygen: Secondary | ICD-10-CM

## 2016-07-01 DIAGNOSIS — K219 Gastro-esophageal reflux disease without esophagitis: Secondary | ICD-10-CM | POA: Diagnosis present

## 2016-07-01 DIAGNOSIS — E119 Type 2 diabetes mellitus without complications: Secondary | ICD-10-CM | POA: Diagnosis present

## 2016-07-01 DIAGNOSIS — W1839XA Other fall on same level, initial encounter: Secondary | ICD-10-CM | POA: Diagnosis present

## 2016-07-01 DIAGNOSIS — Z7984 Long term (current) use of oral hypoglycemic drugs: Secondary | ICD-10-CM

## 2016-07-01 DIAGNOSIS — M659 Synovitis and tenosynovitis, unspecified: Secondary | ICD-10-CM

## 2016-07-01 DIAGNOSIS — F209 Schizophrenia, unspecified: Secondary | ICD-10-CM | POA: Diagnosis present

## 2016-07-01 DIAGNOSIS — N179 Acute kidney failure, unspecified: Secondary | ICD-10-CM | POA: Diagnosis present

## 2016-07-01 DIAGNOSIS — E860 Dehydration: Secondary | ICD-10-CM | POA: Diagnosis present

## 2016-07-01 DIAGNOSIS — E039 Hypothyroidism, unspecified: Secondary | ICD-10-CM | POA: Diagnosis present

## 2016-07-01 DIAGNOSIS — J449 Chronic obstructive pulmonary disease, unspecified: Secondary | ICD-10-CM | POA: Diagnosis present

## 2016-07-01 DIAGNOSIS — M25571 Pain in right ankle and joints of right foot: Secondary | ICD-10-CM

## 2016-07-01 DIAGNOSIS — I2510 Atherosclerotic heart disease of native coronary artery without angina pectoris: Secondary | ICD-10-CM | POA: Diagnosis present

## 2016-07-01 DIAGNOSIS — Z90710 Acquired absence of both cervix and uterus: Secondary | ICD-10-CM

## 2016-07-01 DIAGNOSIS — Z885 Allergy status to narcotic agent status: Secondary | ICD-10-CM

## 2016-07-01 DIAGNOSIS — L03115 Cellulitis of right lower limb: Secondary | ICD-10-CM | POA: Diagnosis present

## 2016-07-01 DIAGNOSIS — R55 Syncope and collapse: Secondary | ICD-10-CM | POA: Diagnosis present

## 2016-07-01 DIAGNOSIS — Z7951 Long term (current) use of inhaled steroids: Secondary | ICD-10-CM

## 2016-07-01 DIAGNOSIS — Z7952 Long term (current) use of systemic steroids: Secondary | ICD-10-CM

## 2016-07-01 DIAGNOSIS — L0390 Cellulitis, unspecified: Secondary | ICD-10-CM

## 2016-07-01 DIAGNOSIS — Z79899 Other long term (current) drug therapy: Secondary | ICD-10-CM

## 2016-07-01 DIAGNOSIS — Z91040 Latex allergy status: Secondary | ICD-10-CM

## 2016-07-01 DIAGNOSIS — M7751 Other enthesopathy of right foot: Secondary | ICD-10-CM

## 2016-07-01 LAB — DIFFERENTIAL
Basophils Absolute: 0 10*3/uL (ref 0–0.1)
Basophils Relative: 1 %
EOS PCT: 5 %
Eosinophils Absolute: 0.5 10*3/uL (ref 0–0.7)
LYMPHS ABS: 2 10*3/uL (ref 1.0–3.6)
LYMPHS PCT: 21 %
MONO ABS: 0.8 10*3/uL (ref 0.2–0.9)
Monocytes Relative: 8 %
Neutro Abs: 5.9 10*3/uL (ref 1.4–6.5)
Neutrophils Relative %: 65 %

## 2016-07-01 LAB — BASIC METABOLIC PANEL
ANION GAP: 9 (ref 5–15)
BUN: 24 mg/dL — ABNORMAL HIGH (ref 6–20)
CO2: 28 mmol/L (ref 22–32)
Calcium: 8.9 mg/dL (ref 8.9–10.3)
Chloride: 100 mmol/L — ABNORMAL LOW (ref 101–111)
Creatinine, Ser: 1.35 mg/dL — ABNORMAL HIGH (ref 0.44–1.00)
GFR calc Af Amer: 44 mL/min — ABNORMAL LOW (ref >60)
GFR, EST NON AFRICAN AMERICAN: 38 mL/min — AB (ref >60)
GLUCOSE: 147 mg/dL — AB (ref 65–99)
POTASSIUM: 4.2 mmol/L (ref 3.5–5.1)
Sodium: 137 mmol/L (ref 135–145)

## 2016-07-01 LAB — CBC
HCT: 32.5 % — ABNORMAL LOW (ref 35.0–47.0)
HEMOGLOBIN: 10.5 g/dL — AB (ref 12.0–16.0)
MCH: 26.1 pg (ref 26.0–34.0)
MCHC: 32.3 g/dL (ref 32.0–36.0)
MCV: 81 fL (ref 80.0–100.0)
Platelets: 198 10*3/uL (ref 150–440)
RBC: 4.01 MIL/uL (ref 3.80–5.20)
RDW: 15 % — ABNORMAL HIGH (ref 11.5–14.5)
WBC: 9.2 10*3/uL (ref 3.6–11.0)

## 2016-07-01 LAB — HEPATIC FUNCTION PANEL
ALBUMIN: 3.7 g/dL (ref 3.5–5.0)
ALK PHOS: 114 U/L (ref 38–126)
ALT: 18 U/L (ref 14–54)
AST: 31 U/L (ref 15–41)
Bilirubin, Direct: 0.1 mg/dL (ref 0.1–0.5)
Indirect Bilirubin: 0.4 mg/dL (ref 0.3–0.9)
TOTAL PROTEIN: 8 g/dL (ref 6.5–8.1)
Total Bilirubin: 0.5 mg/dL (ref 0.3–1.2)

## 2016-07-01 LAB — URINALYSIS, COMPLETE (UACMP) WITH MICROSCOPIC
BACTERIA UA: NONE SEEN
BILIRUBIN URINE: NEGATIVE
Glucose, UA: NEGATIVE mg/dL
HGB URINE DIPSTICK: NEGATIVE
Ketones, ur: NEGATIVE mg/dL
Leukocytes, UA: NEGATIVE
NITRITE: NEGATIVE
PROTEIN: NEGATIVE mg/dL
SPECIFIC GRAVITY, URINE: 1.012 (ref 1.005–1.030)
pH: 6 (ref 5.0–8.0)

## 2016-07-01 LAB — TROPONIN I: Troponin I: <0.03 ng/mL (ref <0.03)

## 2016-07-01 LAB — TSH: TSH: 1.458 u[IU]/mL (ref 0.350–4.500)

## 2016-07-01 LAB — GLUCOSE, CAPILLARY: Glucose-Capillary: 124 mg/dL — ABNORMAL HIGH (ref 65–99)

## 2016-07-01 LAB — BRAIN NATRIURETIC PEPTIDE: B Natriuretic Peptide: 161 pg/mL — ABNORMAL HIGH (ref 0.0–100.0)

## 2016-07-01 LAB — PHOSPHORUS: PHOSPHORUS: 3.7 mg/dL (ref 2.5–4.6)

## 2016-07-01 LAB — MAGNESIUM: Magnesium: 2.2 mg/dL (ref 1.7–2.4)

## 2016-07-01 MED ORDER — RISAQUAD PO CAPS
1.0000 | ORAL_CAPSULE | Freq: Three times a day (TID) | ORAL | Status: DC
  Administered 2016-07-02 (×2): 1 via ORAL
  Filled 2016-07-01 (×2): qty 1

## 2016-07-01 MED ORDER — SODIUM CHLORIDE 0.9% FLUSH
3.0000 mL | Freq: Two times a day (BID) | INTRAVENOUS | Status: DC
  Administered 2016-07-02 (×2): 3 mL via INTRAVENOUS

## 2016-07-01 MED ORDER — ACETAMINOPHEN 650 MG RE SUPP
650.0000 mg | Freq: Four times a day (QID) | RECTAL | Status: DC | PRN
Start: 2016-07-01 — End: 2016-07-02

## 2016-07-01 MED ORDER — ACETAMINOPHEN 325 MG PO TABS
650.0000 mg | ORAL_TABLET | Freq: Four times a day (QID) | ORAL | Status: DC | PRN
  Administered 2016-07-02: 650 mg via ORAL
  Filled 2016-07-01: qty 2

## 2016-07-01 MED ORDER — SENNOSIDES-DOCUSATE SODIUM 8.6-50 MG PO TABS: 1.0000 | ORAL_TABLET | Freq: Every evening | ORAL | Status: DC | PRN

## 2016-07-01 MED ORDER — CARVEDILOL 6.25 MG PO TABS
6.2500 mg | ORAL_TABLET | Freq: Two times a day (BID) | ORAL | Status: DC
  Administered 2016-07-02: 6.25 mg via ORAL
  Filled 2016-07-01: qty 1

## 2016-07-01 MED ORDER — SODIUM CHLORIDE 0.9 % IV SOLN
INTRAVENOUS | Status: DC
  Administered 2016-07-02: via INTRAVENOUS

## 2016-07-01 MED ORDER — ZOLPIDEM TARTRATE 5 MG PO TABS: 5.0000 mg | ORAL_TABLET | Freq: Every evening | ORAL | Status: DC | PRN

## 2016-07-01 MED ORDER — INSULIN ASPART 100 UNIT/ML ~~LOC~~ SOLN
0.0000 [IU] | Freq: Three times a day (TID) | SUBCUTANEOUS | Status: DC
  Administered 2016-07-02: 08:00:00 2 [IU] via SUBCUTANEOUS
  Filled 2016-07-01: qty 2

## 2016-07-01 MED ORDER — MOMETASONE FURO-FORMOTEROL FUM 200-5 MCG/ACT IN AERO
2.0000 | INHALATION_SPRAY | Freq: Two times a day (BID) | RESPIRATORY_TRACT | Status: DC
  Administered 2016-07-02 (×2): 2 via RESPIRATORY_TRACT
  Filled 2016-07-01: qty 8.8

## 2016-07-01 MED ORDER — RISPERIDONE 0.5 MG PO TABS
1.0000 mg | ORAL_TABLET | Freq: Every day | ORAL | Status: DC
  Administered 2016-07-02: 1 mg via ORAL
  Filled 2016-07-01: qty 2

## 2016-07-01 MED ORDER — ENOXAPARIN SODIUM 40 MG/0.4ML ~~LOC~~ SOLN
40.0000 mg | SUBCUTANEOUS | Status: DC
  Administered 2016-07-02: 40 mg via SUBCUTANEOUS
  Filled 2016-07-01: qty 0.4

## 2016-07-01 MED ORDER — ISOSORBIDE MONONITRATE ER 60 MG PO TB24
60.0000 mg | ORAL_TABLET | Freq: Every day | ORAL | Status: DC
  Administered 2016-07-02: 60 mg via ORAL
  Filled 2016-07-01: qty 1

## 2016-07-01 MED ORDER — ALUM & MAG HYDROXIDE-SIMETH 200-200-20 MG/5ML PO SUSP: 30.0000 mL | Freq: Four times a day (QID) | ORAL | Status: DC | PRN

## 2016-07-01 MED ORDER — INSULIN ASPART 100 UNIT/ML ~~LOC~~ SOLN: 0.0000 [IU] | Freq: Every day | SUBCUTANEOUS | Status: DC

## 2016-07-01 MED ORDER — BISACODYL 5 MG PO TBEC: 5.0000 mg | DELAYED_RELEASE_TABLET | Freq: Every day | ORAL | Status: DC | PRN

## 2016-07-01 MED ORDER — CLOTRIMAZOLE 1 % EX CREA
TOPICAL_CREAM | Freq: Two times a day (BID) | CUTANEOUS | Status: DC
  Administered 2016-07-02 (×2): via TOPICAL
  Filled 2016-07-01: qty 15

## 2016-07-01 MED ORDER — ALPRAZOLAM 0.25 MG PO TABS
0.2500 mg | ORAL_TABLET | Freq: Every evening | ORAL | Status: DC | PRN
  Administered 2016-07-02: 0.25 mg via ORAL
  Filled 2016-07-01: qty 1

## 2016-07-01 MED ORDER — CITALOPRAM HYDROBROMIDE 20 MG PO TABS
10.0000 mg | ORAL_TABLET | Freq: Every day | ORAL | Status: DC
  Administered 2016-07-02: 10:00:00 10 mg via ORAL
  Filled 2016-07-01: qty 1

## 2016-07-01 MED ORDER — MAGNESIUM CITRATE PO SOLN: 1.0000 | Freq: Once | ORAL | Status: DC | PRN

## 2016-07-01 MED ORDER — LEVOTHYROXINE SODIUM 150 MCG PO TABS
150.0000 ug | ORAL_TABLET | Freq: Every day | ORAL | Status: DC
  Administered 2016-07-02: 08:00:00 150 ug via ORAL
  Filled 2016-07-01: qty 1

## 2016-07-01 MED ORDER — ONDANSETRON HCL 4 MG PO TABS: 4.0000 mg | ORAL_TABLET | Freq: Four times a day (QID) | ORAL | Status: DC | PRN

## 2016-07-01 MED ORDER — AMOXICILLIN-POT CLAVULANATE 875-125 MG PO TABS
1.0000 | ORAL_TABLET | Freq: Two times a day (BID) | ORAL | Status: DC
  Administered 2016-07-02 (×2): 1 via ORAL
  Filled 2016-07-01 (×2): qty 1

## 2016-07-01 MED ORDER — SODIUM CHLORIDE 0.9 % IV SOLN
Freq: Once | INTRAVENOUS | Status: AC
  Administered 2016-07-01: 21:00:00 via INTRAVENOUS

## 2016-07-01 MED ORDER — NITROGLYCERIN 0.4 MG SL SUBL: 0.4000 mg | SUBLINGUAL_TABLET | SUBLINGUAL | Status: DC | PRN

## 2016-07-01 MED ORDER — PANTOPRAZOLE SODIUM 40 MG PO TBEC
40.0000 mg | DELAYED_RELEASE_TABLET | Freq: Every day | ORAL | Status: DC
  Administered 2016-07-02: 08:00:00 40 mg via ORAL
  Filled 2016-07-01: qty 1

## 2016-07-01 MED ORDER — OMEGA-3-ACID ETHYL ESTERS 1 G PO CAPS
1.0000 | ORAL_CAPSULE | Freq: Two times a day (BID) | ORAL | Status: DC
  Administered 2016-07-02 (×2): 1 g via ORAL
  Filled 2016-07-01 (×3): qty 1

## 2016-07-01 MED ORDER — ONDANSETRON HCL 4 MG/2ML IJ SOLN: 4.0000 mg | Freq: Four times a day (QID) | INTRAMUSCULAR | Status: DC | PRN

## 2016-07-01 NOTE — H&P (Addendum)
History and Physical   [facility] - [place] @ [facility] Admission History and Physical [employer], D.O.    Patient Name: Kristen Watts MR#: [id number] Date of Birth: 1941/03/29 Date of Admission: 07/01/2016  Referring MD/NP/PA: Dr. Darnelle Catalan Primary Care Physician: Phine[facility] Specialists: Natasha Bence, Logan Bores Patient coming from: Home  Chief Complaint: Syncope  HPI: Kristen Watts is a 76 y.o. female with a known history of OA, asthma, CHF, COPD, CAD, HTN, DM, schizophrenia was in a usual state of health until this evening when she states that she stood up, turned a corner in her home and the next thing she knew she was on the floor. She believes she lost consciousness but is unclear what happened. She does not recall the events preceding her fall. She does state that she hit the back of her head and her neck. Patient states that she has fallen more frequent recently recently. She is also concerned about bilateral lower extremity swelling right greater than left which has been present for about 2 weeks.  Per emergency department documentation patient reported stories about salamanders sleeping in her bed and licking her toenails. On questioning she states that she has no blankets on her bed and no self in her home because of the mice and rats. It is unclear whether or not this is true however she did not reiterate the story about salamanders to me.   Otherwise patient denies that there has been any recent change in status. Patient has been taking medication as prescribed and there has been no recent change in medication or diet.  No recent antibiotics.  There has been no recent illness, hospitalizations, travel or sick contacts.    Patient denies fevers/chills, weakness, dizziness, chest pain, shortness of breath, N/V/C/D, abdominal pain, dysuria/frequency, changes in mental status.   ED Course: Patient rec'd NS  Review of Systems:   CONSTITUTIONAL: No fever/chills, fatigue, weakness, weight gain/loss, headache. She did head pain following fall EYES: No blurry or double vision. ENT: No tinnitus, postnasal drip, redness or soreness of the oropharynx. RESPIRATORY: No cough, dyspnea, wheeze.  No hemoptysis.  CARDIOVASCULAR: No chest pain, palpitations, syncope, orthopnea. No lower extremity edema.  GASTROINTESTINAL: No nausea, vomiting, abdominal pain, diarrhea, constipation.  No hematemesis, melena or hematochezia. GENITOURINARY: No dysuria, frequency, hematuria. ENDOCRINE: No polyuria or nocturia. No heat or cold intolerance. HEMATOLOGY: No anemia, bruising, bleeding. INTEGUMENTARY: No rashes, ulcers, lesions. MUSCULOSKELETAL: No arthritis, gout, dyspnea. Positive bilateral lower extremity swelling right greater than left NEUROLOGIC: No numbness, tingling, ataxia, seizure-type activity, weakness. Positive loss of consciousness PSYCHIATRIC: No anxiety, depression, insomnia.   Past Medical History:  Diagnosis Date  . Arthritis   . Asthma   . Blood transfusion without reported diagnosis   . CHF (congestive heart failure) (HCC)   . COPD (chronic obstructive pulmonary disease) (HCC)   . Coronary artery disease   . Diabetes mellitus without complication (HCC)   . Hypertension     Past Surgical History:  Procedure Laterality Date  . ABDOMINAL HYSTERECTOMY    . BACK SURGERY    . BREAST BIOPSY Right    neg  . BREAST SURGERY    . CARDIAC SURGERY    . CHOLECYSTECTOMY    . FRACTURE SURGERY    . TONSILLECTOMY       reports that she has never smoked. She has never used smokeless tobacco. She reports that she does not drink alcohol or use drugs.  Allergies  Allergen Reactions  .  Stadol [Butorphanol] Other (See Comments)    LOC  . Darvon [Propoxyphene] Rash  . Demerol [Meperidine] Palpitations  . Latex Rash    Family History  Problem Relation Age of Onset  . Breast cancer Neg Hx    Family history has been  reviewed and confirmed with patient.   Prior to Admission medications   Medication Sig Start Date End Date Taking? Authorizing Provider  ADVAIR DISKUS 250-50 MCG/DOSE AEPB INHALE 1 PUFF INTO THE LUNGS EVERY 12 HOURS 02/12/16   Historical Provider, MD  albuterol (PROVENTIL HFA;VENTOLIN HFA) 108 (90 Base) MCG/ACT inhaler Inhale 1-2 puffs into the lungs every 4 (four) hours as needed for wheezing or shortness of breath.    Historical Provider, MD  albuterol (PROVENTIL) (2.5 MG/3ML) 0.083% nebulizer solution Take 2.5 mg by nebulization every 4 (four) hours as needed for wheezing or shortness of breath.    Historical Provider, MD  ALPRAZolam Prudy Feeler) 0.25 MG tablet Take 0.25 mg by mouth at bedtime as needed. for sleep 02/27/16   Historical Provider, MD  carvedilol (COREG) 6.25 MG tablet Take 6.25 mg by mouth 2 (two) times daily with a meal.    Historical Provider, MD  celecoxib (CELEBREX) 200 MG capsule Take 200 mg by mouth daily. 03/05/16   Historical Provider, MD  citalopram (CELEXA) 10 MG tablet Take 1 tablet (10 mg total) by mouth daily. 10/20/14 10/20/15  Jene Every, MD  clotrimazole-betamethasone (LOTRISONE) cream Apply topically 2 (two) times daily. 01/07/16   Historical Provider, MD  DEXILANT 60 MG capsule Take 1 capsule by mouth daily. 03/05/16   Historical Provider, MD  furosemide (LASIX) 40 MG tablet TAKE ONE TABLET BY MOUTH EVERY DAY AS NEEDED FOR EDEMA 02/12/16   Historical Provider, MD  ibuprofen (ADVIL,MOTRIN) 600 MG tablet Take 1 tablet (600 mg total) by mouth every 8 (eight) hours as needed. 11/15/15   Tommi Rumps, PA-C  isosorbide mononitrate (IMDUR) 60 MG 24 hr tablet Take 60 mg by mouth daily. 02/12/16   Historical Provider, MD  metFORMIN (GLUCOPHAGE) 500 MG tablet TAKE ONE TABLET BY MOUTH EVERY MORNING WITH BREAKFAST 03/12/16   Historical Provider, MD  nitroGLYCERIN (NITROSTAT) 0.4 MG SL tablet Place 0.4 mg under the tongue every 5 (five) minutes as needed for chest pain.    Historical  Provider, MD  nystatin cream (MYCOSTATIN) Apply 1 application topically 2 (two) times daily. 11/06/15   Tommi Rumps, PA-C  omega-3 acid ethyl esters (LOVAZA) 1 g capsule Take 1 capsule by mouth 2 (two) times daily. 02/23/16   Historical Provider, MD  predniSONE (DELTASONE) 20 MG tablet Take 3 tablets (60 mg total) by mouth daily. 05/04/16   Anne-Caroline Sharma Covert, MD  risperiDONE (RISPERDAL) 1 MG tablet Take 1 tablet (1 mg total) by mouth at bedtime. 10/20/14 10/20/15  Jene Every, MD  SYNTHROID 150 MCG tablet Take 150 mcg by mouth every morning. 03/05/16   Historical Provider, MD  valsartan (DIOVAN) 160 MG tablet Take 1 tablet (160 mg total) by mouth once daily. 02/12/16   Historical Provider, MD    Physical Exam: Vitals:   07/01/16 1808 07/01/16 1944 07/01/16 1945 07/01/16 2100  BP: 125/66 (!) 152/111    Pulse: 70  80 93  Resp: 20  15 (!) 21  Temp: 97.8 F (36.6 C)     TempSrc: Oral     SpO2: 100%  98% 91%  Weight: 81.6 kg (180 lb)     Height: 4\' 10"  (1.473 m)  GENERAL: 76 y.o.-year-old female patient, well-developed, well-nourished lying in the bed in no acute distress.  Pleasant and cooperative.   HEENT: Head atraumatic, normocephalic. Pupils equal, round, reactive to light and accommodation. No scleral icterus. Extraocular muscles intact. Nares are patent. Oropharynx is clear. Mucus membranes moist. NECK: Supple, full range of motion. No JVD, no bruit heard. No thyroid enlargement, no tenderness, no cervical lymphadenopathy. CHEST: Normal breath sounds bilaterally. No wheezing, rales, rhonchi or crackles. No use of accessory muscles of respiration.  No reproducible chest wall tenderness.  CARDIOVASCULAR: S1, S2 normal. No murmurs, rubs, or gallops. Cap refill <2 seconds. Pulses intact distally.  ABDOMEN: Soft, nondistended, nontender. No rebound, guarding, rigidity. Normoactive bowel sounds present in all four quadrants. No organomegaly or mass. EXTREMITIES: Right lower extremity  erythema with pitting edema to mid calf. No cyanosis, or clubbing. No calf tenderness or Homan's sign.  NEUROLOGIC: The patient is alert and oriented x 3. Cranial nerves II through XII are grossly intact with no focal sensorimotor deficit. Muscle strength 5/5 in all extremities. Sensation intact. Gait not checked. PSYCHIATRIC:  Normal affect, mood, thought content. SKIN: Warm, dry, and intact without obvious rash, lesion, or ulcer.    Labs on Admission:  CBC:  Recent Labs Lab 07/01/16 1824  WBC 9.2  NEUTROABS 5.9  HGB 10.5*  HCT 32.5*  MCV 81.0  PLT 198   Basic Metabolic Panel:  Recent Labs Lab 07/01/16 1824  NA 137  K 4.2  CL 100*  CO2 28  GLUCOSE 147*  BUN 24*  CREATININE 1.35*  CALCIUM 8.9   GFR: Estimated Creatinine Clearance: 33 mL/Watts (by C-G formula based on SCr of 1.35 mg/dL (H)). Liver Function Tests:  Recent Labs Lab 07/01/16 1824  AST 31  ALT 18  ALKPHOS 114  BILITOT 0.5  PROT 8.0  ALBUMIN 3.7   No results for input(s): LIPASE, AMYLASE in the last 168 hours. No results for input(s): AMMONIA in the last 168 hours. Coagulation Profile: No results for input(s): INR, PROTIME in the last 168 hours. Cardiac Enzymes:  Recent Labs Lab 07/01/16 1824  TROPONINI <0.03   BNP (last 3 results) No results for input(s): PROBNP in the last 8760 hours. HbA1C: No results for input(s): HGBA1C in the last 72 hours. CBG: No results for input(s): GLUCAP in the last 168 hours. Lipid Profile: No results for input(s): CHOL, HDL, LDLCALC, TRIG, CHOLHDL, LDLDIRECT in the last 72 hours. Thyroid Function Tests: No results for input(s): TSH, T4TOTAL, FREET4, T3FREE, THYROIDAB in the last 72 hours. Anemia Panel: No results for input(s): VITAMINB12, FOLATE, FERRITIN, TIBC, IRON, RETICCTPCT in the last 72 hours. Urine analysis:    Component Value Date/Time   COLORURINE YELLOW (A) 07/01/2016 2005   APPEARANCEUR CLEAR (A) 07/01/2016 2005   LABSPEC 1.012 07/01/2016  2005   PHURINE 6.0 07/01/2016 2005   GLUCOSEU NEGATIVE 07/01/2016 2005   HGBUR NEGATIVE 07/01/2016 2005   BILIRUBINUR NEGATIVE 07/01/2016 2005   KETONESUR NEGATIVE 07/01/2016 2005   PROTEINUR NEGATIVE 07/01/2016 2005   NITRITE NEGATIVE 07/01/2016 2005   LEUKOCYTESUR NEGATIVE 07/01/2016 2005   Sepsis Labs: @LABRCNTIP (procalcitonin:4,lacticidven:4) )No results found for this or any previous visit (from the past 240 hour(s)).   Radiological Exams on Admission: Dg Chest 2 View  Result Date: 07/01/2016 CLINICAL DATA:  Abdominal swelling. History of filter or AAA abdomen repair. EXAM: CHEST  2 VIEW ABDOMEN KUB COMPARISON:  Chest x-ray dated 05/04/2016. FINDINGS: Heart size and mediastinal contours are normal, stable. Atherosclerotic changes noted at the  aortic arch. Stable chronic scarring/atelectasis in the left lower lung. Mild edema and/or pleural thickening along the right minor fissure. Lungs otherwise clear. No pleural effusion or pneumothorax seen. IVC filter in place with tip at the L2 vertebral body level. Visualized bowel gas pattern is nonobstructive. Fairly large amount of stool throughout the grossly nondistended colon. No evidence of soft tissue mass or abnormal fluid collection. No evidence of free intraperitoneal air. Degenerative changes are seen throughout the thoracolumbar spine, at least moderate in degree, and at the bilateral hips. No acute or suspicious osseous finding. IMPRESSION: 1. Questionable mild pleural thickening/fluid along the right minor fissure. No evidence of pneumonia or pulmonary edema. 2. Nonobstructive bowel gas pattern. Fairly large amount of stool throughout the colon (constipation? ). 3. IVC filter with the tip at the L1-2 vertebral body level. 4. Aortic atherosclerosis. Electronically Signed   By: Bary RichardStan  Maynard M.D.   On: 07/01/2016 19:14   Dg Abdomen 1 View  Result Date: 07/01/2016 CLINICAL DATA:  Abdominal swelling. History of filter or AAA abdomen repair.  EXAM: CHEST  2 VIEW ABDOMEN KUB COMPARISON:  Chest x-ray dated 05/04/2016. FINDINGS: Heart size and mediastinal contours are normal, stable. Atherosclerotic changes noted at the aortic arch. Stable chronic scarring/atelectasis in the left lower lung. Mild edema and/or pleural thickening along the right minor fissure. Lungs otherwise clear. No pleural effusion or pneumothorax seen. IVC filter in place with tip at the L2 vertebral body level. Visualized bowel gas pattern is nonobstructive. Fairly large amount of stool throughout the grossly nondistended colon. No evidence of soft tissue mass or abnormal fluid collection. No evidence of free intraperitoneal air. Degenerative changes are seen throughout the thoracolumbar spine, at least moderate in degree, and at the bilateral hips. No acute or suspicious osseous finding. IMPRESSION: 1. Questionable mild pleural thickening/fluid along the right minor fissure. No evidence of pneumonia or pulmonary edema. 2. Nonobstructive bowel gas pattern. Fairly large amount of stool throughout the colon (constipation? ). 3. IVC filter with the tip at the L1-2 vertebral body level. 4. Aortic atherosclerosis. Electronically Signed   By: Bary RichardStan  Maynard M.D.   On: 07/01/2016 19:14   Ct Head Wo Contrast  Result Date: 07/01/2016 CLINICAL DATA:  Neck and upper back pain status post fall today. Hematoma to right occipital area. EXAM: CT HEAD WITHOUT CONTRAST CT CERVICAL SPINE WITHOUT CONTRAST TECHNIQUE: Multidetector CT imaging of the head and cervical spine was performed following the standard protocol without intravenous contrast. Multiplanar CT image reconstructions of the cervical spine were also generated. COMPARISON:  None. FINDINGS: CT HEAD FINDINGS Brain: There is generalized age related parenchymal atrophy with commensurate dilatation of the ventricles and sulci. Chronic small vessel ischemic changes noted within the bilateral periventricular and subcortical white matter. There is  no mass, hemorrhage, edema or other evidence of acute parenchymal abnormality. No extra-axial hemorrhage. Vascular: There are chronic calcified atherosclerotic changes of the large vessels at the skull base. No unexpected hyperdense vessel. Skull: Normal. Negative for fracture or focal lesion. Sinuses/Orbits: No acute finding. Other: Perhaps mild edema overlying the right occipital bone. No scalp hematoma appreciated. CT CERVICAL SPINE FINDINGS Alignment: Reversal of the normal cervical spine lordosis related to underlying degenerative changes. No evidence of acute subluxation seen. Skull base and vertebrae: No fracture line or displaced fracture fragment identified. Facet joints appear intact and normally aligned. Soft tissues and spinal canal: No prevertebral fluid or swelling. No visible canal hematoma. Disc levels: Extensive degenerative change throughout the cervical spine, with associated  ankylosis at multiple levels and mild osseous spurring. Patient is status post surgical central canal decompression from the C3 through the C7 levels. Moderate to severe osseous neural foramen narrowings are seen at the C2-3 through C5-6 levels with possible associated nerve root impingements. Upper chest: Negative. Other: Atherosclerotic changes of the carotid arteries bilaterally. IMPRESSION: 1. No acute intracranial abnormality. No intracranial mass, hemorrhage or edema. No skull fracture. No scalp hematoma seen. Atrophy and chronic ischemic changes in the white matter. 2. No fracture or acute subluxation identified within the cervical spine. Degenerative and surgical changes described above. Electronically Signed   By: Bary Richard M.D.   On: 07/01/2016 19:06   Ct Cervical Spine Wo Contrast  Result Date: 07/01/2016 CLINICAL DATA:  Neck and upper back pain status post fall today. Hematoma to right occipital area. EXAM: CT HEAD WITHOUT CONTRAST CT CERVICAL SPINE WITHOUT CONTRAST TECHNIQUE: Multidetector CT imaging of  the head and cervical spine was performed following the standard protocol without intravenous contrast. Multiplanar CT image reconstructions of the cervical spine were also generated. COMPARISON:  None. FINDINGS: CT HEAD FINDINGS Brain: There is generalized age related parenchymal atrophy with commensurate dilatation of the ventricles and sulci. Chronic small vessel ischemic changes noted within the bilateral periventricular and subcortical white matter. There is no mass, hemorrhage, edema or other evidence of acute parenchymal abnormality. No extra-axial hemorrhage. Vascular: There are chronic calcified atherosclerotic changes of the large vessels at the skull base. No unexpected hyperdense vessel. Skull: Normal. Negative for fracture or focal lesion. Sinuses/Orbits: No acute finding. Other: Perhaps mild edema overlying the right occipital bone. No scalp hematoma appreciated. CT CERVICAL SPINE FINDINGS Alignment: Reversal of the normal cervical spine lordosis related to underlying degenerative changes. No evidence of acute subluxation seen. Skull base and vertebrae: No fracture line or displaced fracture fragment identified. Facet joints appear intact and normally aligned. Soft tissues and spinal canal: No prevertebral fluid or swelling. No visible canal hematoma. Disc levels: Extensive degenerative change throughout the cervical spine, with associated ankylosis at multiple levels and mild osseous spurring. Patient is status post surgical central canal decompression from the C3 through the C7 levels. Moderate to severe osseous neural foramen narrowings are seen at the C2-3 through C5-6 levels with possible associated nerve root impingements. Upper chest: Negative. Other: Atherosclerotic changes of the carotid arteries bilaterally. IMPRESSION: 1. No acute intracranial abnormality. No intracranial mass, hemorrhage or edema. No skull fracture. No scalp hematoma seen. Atrophy and chronic ischemic changes in the white  matter. 2. No fracture or acute subluxation identified within the cervical spine. Degenerative and surgical changes described above. Electronically Signed   By: Bary Richard M.D.   On: 07/01/2016 19:06   US Venous Img Lower Unilateral Right  Result Date: 07/01/2016 CLINICAL DATA:  Right lower extremity edema for 2 weeks. EXAM: Right LOWER EXTREMITY VENOUS DOPPLER ULTRASOUND TECHNIQUE: Gray-scale sonography with graded compression, as well as color Doppler and duplex ultrasound were performed to evaluate the lower extremity deep venous systems from the level of the common femoral vein and including the common femoral, femoral, profunda femoral, popliteal and calf veins including the posterior tibial, peroneal and gastrocnemius veins when visible. The superficial great saphenous vein was also interrogated. Spectral Doppler was utilized to evaluate flow at rest and with distal augmentation maneuvers in the common femoral, femoral and popliteal veins. COMPARISON:  None. FINDINGS: Contralateral Common Femoral Vein: Respiratory phasicity is normal and symmetric with the symptomatic side. No evidence of thrombus. Normal compressibility. Common  Femoral Vein: No evidence of thrombus. Normal compressibility, respiratory phasicity and response to augmentation. Saphenofemoral Junction: No evidence of thrombus. Normal compressibility and flow on color Doppler imaging. Profunda Femoral Vein: No evidence of thrombus. Normal compressibility and flow on color Doppler imaging. Femoral Vein: No evidence of thrombus. Normal compressibility, respiratory phasicity and response to augmentation. Popliteal Vein: No evidence of thrombus. Normal compressibility, respiratory phasicity and response to augmentation. Calf Veins: No evidence of thrombus. Normal compressibility and flow on color Doppler imaging. Superficial Great Saphenous Vein: No evidence of thrombus. Normal compressibility and flow on color Doppler imaging. Venous Reflux:   None. Other Findings:  None. IMPRESSION: No evidence of deep venous thrombosis. Electronically Signed   By: Ellery Plunk M.D.   On: 07/01/2016 19:54    EKG: Normal sinus rhythm at 74 bpm with normal axis and nonspecific ST-T wave changes.   Assessment/Plan   This is a 76 y.o. female with a history of OA, asthma, CHF, COPD, CAD, HTN, DM, schizophrenia now being admitted with:  1. Syncope, likely multifactorial - orthostatic, dehydration - Admit inpatient, telemetry monitoring - Orthostatics - Check echo - Trend trops, check TSh, lipids, A1C - Gentle IVF hydration - Consider cardio consult (follows with [facility])  2. AKI - IV fluids and repeat BMP in AM.  - Avoid nephrotoxic medication. Hold Diovan, Celebrex, Ibuprofen for now - Bladder scan and place foley catheter if evidence of urinary retention  3. Cellulitis, mild - PO Abx - Augmentin - Local wound care  4. H/o CHF, mildly elevated BNP, asymptomatic - Telemetry monitoring. - Beta blocker, ACE, diuretic, nitrate.  - Intake/output, daily weight.  5. H/o asthma/COPD - Continue O2 and nebulizer therapy as needed -Continue Dulera in place of Advair  6. H/o CAD - Continue nitroglycerin  7. H/o HTN - Continue Coreg, Imdur  8. H/o Diabetes - Accuchecks achs with RISS coverage -Hold metformin - Heart healthy, carb controlled diet  9. H/o Schizophrenia, anxiety - Continue Celexa, risperidone, Xanax - Consider psych consult if patient exhibits psychiatric symptoms.   10. History of GERD -Continue Protonix in place of Prilosec  11. History of hypothyroidism -Continue Synthroid   Admission status: Inpatient, telemetry IV Fluids: IV normal saline Diet/Nutrition: Healthy, carb controlled Consults called: Wound care, PT  DVT Px: Lovenox, SCDs and early ambulation. Code Status: Full Code  Disposition Plan: To be determined in 1-2 days   All the records are reviewed and case discussed with ED provider.  Management plans discussed with the patient and/or family who express understanding and agree with plan of care.  Nakyra Bourn D.O. on 07/01/2016 at 9:22 PM Between 7am to 6pm - Pager - [phone] After 6pm go to [email or web address] - Social research officer, government [facility] [place] [facility] Office [phone] CC: Primary care physician; Phineas Real [facility]   07/01/2016, 9:22 PM

## 2016-07-01 NOTE — ED Triage Notes (Signed)
Pt presents to ED via [facility] from home c/o neck and upper back pain s/p fall today around 0300. +hematoma to R occipital area. No bleeding. Pt denies use of blood thinners. States she thinks her knees buckled but does not remember if she passed out. Pt has hx upper rib removal bil, and goiter. Arrives in "soft collar" made with sheets by EMS d/t unable to fit into c-collar. Pt also states she has had RLE swelling and weakness x3-4wks, thinks it may have contributed to fall.

## 2016-07-01 NOTE — ED Provider Notes (Signed)
[facility] Emergency Department Provider Note   ____________________________________________   First MD Initiated Contact with Patient 07/01/16 1743     (approximate)  I have reviewed the triage vital signs and the nursing notes.   HISTORY  Chief Complaint Fall; Neck Pain; and Loss of Consciousness    HPI Kristen Watts is a 76 y.o. female patient reports she fell and hit her neck and back of her head. She is not quite sure why she did stand up earlier in an her knees buckled and she's not sure she passed out or not. She says she has her upper ribs removed and has a goiter. She reports she's had right lower leg swelling and weakness for 2 weeks of that she told the nurse 3 or 4 weeks. While in the emergency room she developed progressive redness of the right leg and some slight redness of the left ankle. She has a history of cellulitis in the past. Patient also reports she is having to salamander's which sleep in her bed at night lick her toenails and making them smaller to get the calcium. This is resulted and the discoloration of her left great toenail. 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   Past Medical History:  Diagnosis Date  . Arthritis   . Asthma   . Blood transfusion without reported diagnosis   . CHF (congestive heart failure) (HCC)   . COPD (chronic obstructive pulmonary disease) (HCC)   . Coronary artery disease   . Diabetes mellitus without complication (HCC)   . Hypertension     There are no active problems to display for this patient.   Past Surgical History:  Procedure Laterality Date  . ABDOMINAL HYSTERECTOMY    . BACK SURGERY    . BREAST BIOPSY Right    neg  . BREAST SURGERY    . CARDIAC SURGERY    . CHOLECYSTECTOMY    . FRACTURE SURGERY    . TONSILLECTOMY      Prior to Admission medications   Medication Sig Start Date End Date Taking? Authorizing  Provider  ADVAIR DISKUS 250-50 MCG/DOSE AEPB INHALE 1 PUFF INTO THE LUNGS EVERY 12 HOURS 02/12/16   Historical Provider, MD  albuterol (PROVENTIL HFA;VENTOLIN HFA) 108 (90 Base) MCG/ACT inhaler Inhale 1-2 puffs into the lungs every 4 (four) hours as needed for wheezing or shortness of breath.    Historical Provider, MD  albuterol (PROVENTIL) (2.5 MG/3ML) 0.083% nebulizer solution Take 2.5 mg by nebulization every 4 (four) hours as needed for wheezing or shortness of breath.    Historical Provider, MD  ALPRAZolam Prudy Feeler(XANAX) 0.25 MG tablet Take 0.25 mg by mouth at bedtime as needed. for sleep 02/27/16   Historical Provider, MD  carvedilol (COREG) 6.25 MG tablet Take 6.25 mg by mouth 2 (two) times daily with a meal.    Historical Provider, MD  celecoxib (CELEBREX) 200 MG capsule Take 200 mg by mouth daily. 03/05/16   Historical Provider, MD  citalopram (CELEXA) 10 MG tablet Take 1 tablet (10 mg total) by mouth daily. 10/20/14 10/20/15  Jene Everyobert Kinner, MD  clotrimazole-betamethasone (LOTRISONE) cream Apply topically 2 (two) times daily. 01/07/16   Historical Provider, MD  DEXILANT 60 MG capsule Take 1 capsule by mouth daily. 03/05/16   Historical Provider, MD  furosemide (LASIX) 40 MG tablet TAKE ONE TABLET BY MOUTH EVERY DAY AS NEEDED FOR EDEMA 02/12/16   Historical Provider, MD  ibuprofen (ADVIL,MOTRIN)  600 MG tablet Take 1 tablet (600 mg total) by mouth every 8 (eight) hours as needed. 11/15/15   Tommi Rumps, PA-C  isosorbide mononitrate (IMDUR) 60 MG 24 hr tablet Take 60 mg by mouth daily. 02/12/16   Historical Provider, MD  metFORMIN (GLUCOPHAGE) 500 MG tablet TAKE ONE TABLET BY MOUTH EVERY MORNING WITH BREAKFAST 03/12/16   Historical Provider, MD  nitroGLYCERIN (NITROSTAT) 0.4 MG SL tablet Place 0.4 mg under the tongue every 5 (five) minutes as needed for chest pain.    Historical Provider, MD  nystatin cream (MYCOSTATIN) Apply 1 application topically 2 (two) times daily. 11/06/15   Tommi Rumps, PA-C    omega-3 acid ethyl esters (LOVAZA) 1 g capsule Take 1 capsule by mouth 2 (two) times daily. 02/23/16   Historical Provider, MD  predniSONE (DELTASONE) 20 MG tablet Take 3 tablets (60 mg total) by mouth daily. 05/04/16   Anne-Caroline Sharma Covert, MD  risperiDONE (RISPERDAL) 1 MG tablet Take 1 tablet (1 mg total) by mouth at bedtime. 10/20/14 10/20/15  Jene Every, MD  SYNTHROID 150 MCG tablet Take 150 mcg by mouth every morning. 03/05/16   Historical Provider, MD  valsartan (DIOVAN) 160 MG tablet Take 1 tablet (160 mg total) by mouth once daily. 02/12/16   Historical Provider, MD    Allergies Stadol [butorphanol]; Darvon [propoxyphene]; Demerol [meperidine]; and Latex  Family History  Problem Relation Age of Onset  . Breast cancer Neg Hx     Social History Social History  Substance Use Topics  . Smoking status: Never Smoker  . Smokeless tobacco: Never Used  . Alcohol use No    Review of Systems Constitutional: No fever/chills Eyes: No visual changes. ENT: No sore throat. Cardiovascular: Denies chest pain. Respiratory: Denies shortness of breath. Gastrointestinal: No abdominal pain.  No nausea, no vomiting.  No diarrhea.  No constipation. Genitourinary: Negative for dysuria. Musculoskeletal: Negative for back pain. Skin: Negative for rash. Neurological: Negative for headaches, focal weakness or numbness.  10-point ROS otherwise negative.  ____________________________________________   PHYSICAL EXAM:  VITAL SIGNS: ED Triage Vitals  Enc Vitals Group     BP 07/01/16 1808 125/66     Pulse Rate 07/01/16 1808 70     Resp 07/01/16 1808 20     Temp 07/01/16 1808 97.8 F (36.6 C)     Temp Source 07/01/16 1808 Oral     SpO2 07/01/16 1808 100 %     Weight 07/01/16 1808 180 lb (81.6 kg)     Height 07/01/16 1808 4\' 10"  (1.473 m)     Head Circumference --      Peak Flow --      Pain Score 07/01/16 1809 7     Pain Loc --      Pain Edu? --      Excl. in GC? --      Constitutional: Alert and oriented. Well appearing and in no acute distress. Eyes: Conjunctivae are normal. PERRL. EOMI. Head: AtraumaticExcept for a area on the right occiput which is somewhat swollen and slightly tender. Nose: No congestion/rhinnorhea. Mouth/Throat: Mucous membranes are moist.  Oropharynx non-erythematous. Neck: No stridor.   cervical spine tenderness to palpation. Cardiovascular: Normal rate, regular rhythm. Grossly normal heart sounds.  Good peripheral circulation. Respiratory: Normal respiratory effort.  No retractions. Lungs CTAB. Gastrointestinal: Soft and nontender. No distention. No abdominal bruits. No CVA tenderness. Musculoskeletal: Right leg is swollen red warm and slightly tender left ankle is similar.  No joint effusions. Neurologic:  Normal  speech and language. No gross focal neurologic deficits are appreciated.   ____________________________________________   LABS (all labs ordered are listed, but only abnormal results are displayed)  Labs Reviewed  BASIC METABOLIC PANEL - Abnormal; Notable for the following:       Result Value   Chloride 100 (*)    Glucose, Bld 147 (*)    BUN 24 (*)    Creatinine, Ser 1.35 (*)    GFR calc non Af Amer 38 (*)    GFR calc Af Amer 44 (*)    All other components within normal limits  CBC - Abnormal; Notable for the following:    Hemoglobin 10.5 (*)    HCT 32.5 (*)    RDW 15.0 (*)    All other components within normal limits  URINALYSIS, COMPLETE (UACMP) WITH MICROSCOPIC - Abnormal; Notable for the following:    Color, Urine YELLOW (*)    APPearance CLEAR (*)    Squamous Epithelial / LPF 0-5 (*)    All other components within normal limits  BRAIN NATRIURETIC PEPTIDE - Abnormal; Notable for the following:    B Natriuretic Peptide 161.0 (*)    All other components within normal limits  TROPONIN I  HEPATIC FUNCTION PANEL  DIFFERENTIAL  CBC WITH DIFFERENTIAL/PLATELET    ____________________________________________  EKG   ____________________________________________  RADIOLOGY  Study Result   CLINICAL DATA:  Neck and upper back pain status post fall today. Hematoma to right occipital area.  EXAM: CT HEAD WITHOUT CONTRAST  CT CERVICAL SPINE WITHOUT CONTRAST  TECHNIQUE: Multidetector CT imaging of the head and cervical spine was performed following the standard protocol without intravenous contrast. Multiplanar CT image reconstructions of the cervical spine were also generated.  COMPARISON:  None.  FINDINGS: CT HEAD FINDINGS  Brain: There is generalized age related parenchymal atrophy with commensurate dilatation of the ventricles and sulci. Chronic small vessel ischemic changes noted within the bilateral periventricular and subcortical white matter.  There is no mass, hemorrhage, edema or other evidence of acute parenchymal abnormality. No extra-axial hemorrhage.  Vascular: There are chronic calcified atherosclerotic changes of the large vessels at the skull base. No unexpected hyperdense vessel.  Skull: Normal. Negative for fracture or focal lesion.  Sinuses/Orbits: No acute finding.  Other: Perhaps mild edema overlying the right occipital bone. No scalp hematoma appreciated.  CT CERVICAL SPINE FINDINGS  Alignment: Reversal of the normal cervical spine lordosis related to underlying degenerative changes. No evidence of acute subluxation seen.  Skull base and vertebrae: No fracture line or displaced fracture fragment identified. Facet joints appear intact and normally aligned.  Soft tissues and spinal canal: No prevertebral fluid or swelling. No visible canal hematoma.  Disc levels: Extensive degenerative change throughout the cervical spine, with associated ankylosis at multiple levels and mild osseous spurring. Patient is status post surgical central canal decompression from the C3 through the C7  levels.  Moderate to severe osseous neural foramen narrowings are seen at the C2-3 through C5-6 levels with possible associated nerve root impingements.  Upper chest: Negative.  Other: Atherosclerotic changes of the carotid arteries bilaterally.  IMPRESSION: 1. No acute intracranial abnormality. No intracranial mass, hemorrhage or edema. No skull fracture. No scalp hematoma seen. Atrophy and chronic ischemic changes in the white matter. 2. No fracture or acute subluxation identified within the cervical spine. Degenerative and surgical changes described above.   Electronically Signed   By: Bary Richard M.D.   On: 07/01/2016 19:06    Study Result  CLINICAL DATA:  Abdominal swelling. History of filter or AAA abdomen repair.  EXAM: CHEST  2 VIEW  ABDOMEN KUB  COMPARISON:  Chest x-ray dated 05/04/2016.  FINDINGS: Heart size and mediastinal contours are normal, stable. Atherosclerotic changes noted at the aortic arch.  Stable chronic scarring/atelectasis in the left lower lung. Mild edema and/or pleural thickening along the right minor fissure. Lungs otherwise clear. No pleural effusion or pneumothorax seen.  IVC filter in place with tip at the L2 vertebral body level. Visualized bowel gas pattern is nonobstructive. Fairly large amount of stool throughout the grossly nondistended colon. No evidence of soft tissue mass or abnormal fluid collection. No evidence of free intraperitoneal air.  Degenerative changes are seen throughout the thoracolumbar spine, at least moderate in degree, and at the bilateral hips. No acute or suspicious osseous finding.  IMPRESSION: 1. Questionable mild pleural thickening/fluid along the right minor fissure. No evidence of pneumonia or pulmonary edema. 2. Nonobstructive bowel gas pattern. Fairly large amount of stool throughout the colon (constipation? ). 3. IVC filter with the tip at the L1-2 vertebral body level. 4.  Aortic atherosclerosis.   Electronically Signed   By: Bary Richard M.D.   On: 07/01/2016 19:14    Study Result   CLINICAL DATA:  Right lower extremity edema for 2 weeks.  EXAM: Right LOWER EXTREMITY VENOUS DOPPLER ULTRASOUND  TECHNIQUE: Gray-scale sonography with graded compression, as well as color Doppler and duplex ultrasound were performed to evaluate the lower extremity deep venous systems from the level of the common femoral vein and including the common femoral, femoral, profunda femoral, popliteal and calf veins including the posterior tibial, peroneal and gastrocnemius veins when visible. The superficial great saphenous vein was also interrogated. Spectral Doppler was utilized to evaluate flow at rest and with distal augmentation maneuvers in the common femoral, femoral and popliteal veins.  COMPARISON:  None.  FINDINGS: Contralateral Common Femoral Vein: Respiratory phasicity is normal and symmetric with the symptomatic side. No evidence of thrombus. Normal compressibility.  Common Femoral Vein: No evidence of thrombus. Normal compressibility, respiratory phasicity and response to augmentation.  Saphenofemoral Junction: No evidence of thrombus. Normal compressibility and flow on color Doppler imaging.  Profunda Femoral Vein: No evidence of thrombus. Normal compressibility and flow on color Doppler imaging.  Femoral Vein: No evidence of thrombus. Normal compressibility, respiratory phasicity and response to augmentation.  Popliteal Vein: No evidence of thrombus. Normal compressibility, respiratory phasicity and response to augmentation.  Calf Veins: No evidence of thrombus. Normal compressibility and flow on color Doppler imaging.  Superficial Great Saphenous Vein: No evidence of thrombus. Normal compressibility and flow on color Doppler imaging.  Venous Reflux:  None.  Other Findings:  None.  IMPRESSION: No evidence of deep venous  thrombosis.   Electronically Signed   By: Ellery Plunk M.D.   On: 07/01/2016 19:54    ____________________________________________   PROCEDURES  Procedure(s) performed:  Procedures  Critical Care performed:   ____________________________________________   INITIAL IMPRESSION / ASSESSMENT AND PLAN / ED COURSE  Pertinent labs & imaging results that were available during my care of the patient were reviewed by me and considered in my medical decision making (see chart for details).  Patient's chest x-ray I believe looks a little bit like congestive heart failure and her BNP is elevated however her BUN and creatinine look like perhaps she is a little dry as well. Perhaps she is not perfusing her kidneys quite as well as she could.  ____________________________________________   FINAL CLINICAL IMPRESSION(S) / ED DIAGNOSES  Final diagnoses:  Cellulitis, unspecified cellulitis site  Syncope, unspecified syncope type      NEW MEDICATIONS STARTED DURING THIS VISIT:  New Prescriptions   No medications on file     Note:  This document was prepared using Dragon voice recognition software and may include unintentional dictation errors.    Arnaldo Natal, MD 07/01/16 2117

## 2016-07-01 NOTE — ED Notes (Signed)
MD took patient off O2; sats maintaining between 93-97% at this time.

## 2016-07-02 ENCOUNTER — Inpatient Hospital Stay: Admit: 2016-07-02 | Payer: Medicare Other

## 2016-07-02 LAB — GLUCOSE, CAPILLARY
GLUCOSE-CAPILLARY: 101 mg/dL — AB (ref 65–99)
Glucose-Capillary: 122 mg/dL — ABNORMAL HIGH (ref 65–99)

## 2016-07-02 LAB — BASIC METABOLIC PANEL
Anion gap: 6 (ref 5–15)
BUN: 17 mg/dL (ref 6–20)
CALCIUM: 8.6 mg/dL — AB (ref 8.9–10.3)
CO2: 28 mmol/L (ref 22–32)
CREATININE: 0.97 mg/dL (ref 0.44–1.00)
Chloride: 104 mmol/L (ref 101–111)
GFR calc non Af Amer: 56 mL/min — ABNORMAL LOW (ref >60)
Glucose, Bld: 124 mg/dL — ABNORMAL HIGH (ref 65–99)
Potassium: 4.1 mmol/L (ref 3.5–5.1)
SODIUM: 138 mmol/L (ref 135–145)

## 2016-07-02 LAB — TROPONIN I
Troponin I: <0.03 ng/mL (ref <0.03)
Troponin I: <0.03 ng/mL (ref <0.03)

## 2016-07-02 LAB — CBC
HCT: 30.9 % — ABNORMAL LOW (ref 35.0–47.0)
Hemoglobin: 10 g/dL — ABNORMAL LOW (ref 12.0–16.0)
MCH: 26.2 pg (ref 26.0–34.0)
MCHC: 32.5 g/dL (ref 32.0–36.0)
MCV: 80.5 fL (ref 80.0–100.0)
PLATELETS: 156 10*3/uL (ref 150–440)
RBC: 3.83 MIL/uL (ref 3.80–5.20)
RDW: 15.3 % — ABNORMAL HIGH (ref 11.5–14.5)
WBC: 6.4 10*3/uL (ref 3.6–11.0)

## 2016-07-02 MED ORDER — AMOXICILLIN-POT CLAVULANATE 875-125 MG PO TABS: 1.0000 | ORAL_TABLET | Freq: Two times a day (BID) | ORAL | 0 refills | Status: DC

## 2016-07-02 NOTE — Discharge Summary (Signed)
[facility] - [place] at [facility]   PATIENT NAME: Kristen Watts    MR#:  [id number]  DATE OF BIRTH:  1941-05-24  DATE OF ADMISSION:  07/01/2016 ADMITTING PHYSICIAN: Tonye Royalty, DO  DATE OF DISCHARGE: 07/02/16  PRIMARY CARE PHYSICIAN: [facility]    ADMISSION DIAGNOSIS:  Capsulitis of right ankle [M77.51] Acute right ankle pain [M25.571] Synovitis of right ankle [M65.9] Syncope, unspecified syncope type [R55] Cellulitis, unspecified cellulitis site [L03.90]  DISCHARGE DIAGNOSIS:  Syncope due to Dehydration-resolved Acute renal failure-resolved Right LE cellulitis-mild  SECONDARY DIAGNOSIS:   Past Medical History:  Diagnosis Date  . Arthritis   . Asthma   . Blood transfusion without reported diagnosis   . CHF (congestive heart failure) (HCC)   . COPD (chronic obstructive pulmonary disease) (HCC)   . Coronary artery disease   . Diabetes mellitus without complication (HCC)   . Hypertension     HOSPITAL COURSE:   76 y.o. female with a history of OA, asthma, CHF, COPD, CAD, HTN, DM, schizophrenia now being admitted with:  1. Syncope, likely multifactorial - orthostatic, dehydration - Admit inpatient, telemetry monitoring - Orthostatics positive on admision -recieved IVF hydration  2. AKI - received IV fluids and repeat BMP  Resolved creatinine - Avoid nephrotoxic medication -resume home  3. Cellulitis, mild - PO Abx - Augmentin  4. H/o CHF, mildly elevated BNP, asymptomatic - Beta blocker, ACE, diuretic, nitrate.   5. H/o asthma/COPD - Continue chronic home O2 and nebulizer therapy as needed -Continue Dulera in place of Advair  6. H/o CAD - Continue nitroglycerin  7. H/o HTN - Continue Coreg, Imdur  8. H/o Diabetes - Accuchecks achs with RISS coverage -Hold metformin - Heart healthy, carb controlled diet  9. H/o Schizophrenia, anxiety - Continue Celexa, risperidone, Xanax  10. History of  GERD -Continue Protonix in place of Prilosec  11. History of hypothyroidism -Continue Synthroid   Overall improved. D/c home CONSULTS OBTAINED:    DRUG ALLERGIES:   Allergies  Allergen Reactions  . Stadol [Butorphanol] Other (See Comments)    LOC  . Darvon [Propoxyphene] Rash  . Demerol [Meperidine] Palpitations  . Latex Rash    DISCHARGE MEDICATIONS:   Current Discharge Medication List    START taking these medications   Details  amoxicillin-clavulanate (AUGMENTIN) 875-125 MG tablet Take 1 tablet by mouth every 12 (twelve) hours. Qty: 14 tablet, Refills: 0      CONTINUE these medications which have NOT CHANGED   Details  ADVAIR DISKUS 250-50 MCG/DOSE AEPB INHALE 1 PUFF INTO THE LUNGS EVERY 12 HOURS Refills: 5    albuterol (PROVENTIL HFA;VENTOLIN HFA) 108 (90 Base) MCG/ACT inhaler Inhale 1-2 puffs into the lungs every 4 (four) hours as needed for wheezing or shortness of breath.    albuterol (PROVENTIL) (2.5 MG/3ML) 0.083% nebulizer solution Take 2.5 mg by nebulization every 4 (four) hours as needed for wheezing or shortness of breath.    ALPRAZolam (XANAX) 0.25 MG tablet Take 0.25 mg by mouth at bedtime as needed. for sleep Refills: 3    carvedilol (COREG) 6.25 MG tablet Take 6.25 mg by mouth 2 (two) times daily with a meal.    celecoxib (CELEBREX) 200 MG capsule Take 200 mg by mouth daily. Refills: 5    citalopram (CELEXA) 10 MG tablet Take 1 tablet (10 mg total) by mouth daily. Qty: 30 tablet, Refills: 2    clotrimazole-betamethasone (LOTRISONE) cream Apply topically 2 (two) times daily. Refills: 2  DEXILANT 60 MG capsule Take 1 capsule by mouth daily. Refills: 6    furosemide (LASIX) 40 MG tablet TAKE ONE TABLET BY MOUTH EVERY DAY AS NEEDED FOR EDEMA Refills: 6    ibuprofen (ADVIL,MOTRIN) 600 MG tablet Take 1 tablet (600 mg total) by mouth every 8 (eight) hours as needed. Qty: 30 tablet, Refills: 0    isosorbide mononitrate (IMDUR) 60 MG 24 hr  tablet Take 60 mg by mouth daily. Refills: 5    metFORMIN (GLUCOPHAGE) 500 MG tablet TAKE ONE TABLET BY MOUTH EVERY MORNING WITH BREAKFAST Refills: 6    nitroGLYCERIN (NITROSTAT) 0.4 MG SL tablet Place 0.4 mg under the tongue every 5 (five) minutes as needed for chest pain.    nystatin cream (MYCOSTATIN) Apply 1 application topically 2 (two) times daily. Qty: 60 g, Refills: 1    omega-3 acid ethyl esters (LOVAZA) 1 g capsule Take 1 capsule by mouth 2 (two) times daily. Refills: 6    predniSONE (DELTASONE) 20 MG tablet Take 3 tablets (60 mg total) by mouth daily. Qty: 15 tablet, Refills: 0    risperiDONE (RISPERDAL) 1 MG tablet Take 1 tablet (1 mg total) by mouth at bedtime. Qty: 30 tablet, Refills: 1    SYNTHROID 150 MCG tablet Take 150 mcg by mouth every morning. Refills: 3    valsartan (DIOVAN) 160 MG tablet Take 1 tablet (160 mg total) by mouth once daily. Refills: 3        If you experience worsening of your admission symptoms, develop shortness of breath, life threatening emergency, suicidal or homicidal thoughts you must seek medical attention immediately by calling 911 or calling your MD immediately  if symptoms less severe.  You Must read complete instructions/literature along with all the possible adverse reactions/side effects for all the Medicines you take and that have been prescribed to you. Take any new Medicines after you have completely understood and accept all the possible adverse reactions/side effects.   Please note  You were cared for by a hospitalist during your hospital stay. If you have any questions about your discharge medications or the care you received while you were in the hospital after you are discharged, you can call the unit and asked to speak with the hospitalist on call if the hospitalist that took care of you is not available. Once you are discharged, your primary care physician will handle any further medical issues. Please note that NO REFILLS  for any discharge medications will be authorized once you are discharged, as it is imperative that you return to your primary care physician (or establish a relationship with a primary care physician if you do not have one) for your aftercare needs so that they can reassess your need for medications and monitor your lab values. Today   SUBJECTIVE   Feels b etter  VITAL SIGNS:  Blood pressure (!) 131/58, pulse 84, temperature 98.6 F (37 C), temperature source Oral, resp. rate 18, height 4\' 10"  (1.473 m), weight 88.1 kg (194 lb 3.2 oz), SpO2 97 %.  I/O:   Intake/Output Summary (Last 24 hours) at 07/02/16 1000 Last data filed at 07/02/16 0341  Gross per 24 hour  Intake            757.5 ml  Output                1 ml  Net            756.5 ml    PHYSICAL EXAMINATION:  GENERAL:  76 y.o.-year-old patient lying in the bed with no acute distress. obese EYES: Pupils equal, round, reactive to light and accommodation. No scleral icterus. Extraocular muscles intact.  HEENT: Head atraumatic, normocephalic. Oropharynx and nasopharynx clear.  NECK:  Supple, no jugular venous distention. No thyroid enlargement, no tenderness.  LUNGS: Normal breath sounds bilaterally, no wheezing, rales,rhonchi or crepitation. No use of accessory muscles of respiration.  CARDIOVASCULAR: S1, S2 normal. No murmurs, rubs, or gallops.  ABDOMEN: Soft, non-tender, non-distended. Bowel sounds present. No organomegaly or mass.  EXTREMITIES: No pedal edema, cyanosis, or clubbing.  NEUROLOGIC: Cranial nerves II through XII are intact. Muscle strength 5/5 in all extremities. Sensation intact. Gait not checked.  PSYCHIATRIC: The patient is alert and oriented x 3.  SKIN: No obvious rash, lesion, or ulcer.   DATA REVIEW:   CBC   Recent Labs Lab 07/02/16 0714  WBC 6.4  HGB 10.0*  HCT 30.9*  PLT 156    Chemistries   Recent Labs Lab 07/01/16 1824 07/02/16 0714  NA 137 138  K 4.2 4.1  CL 100* 104  CO2 28 28   GLUCOSE 147* 124*  BUN 24* 17  CREATININE 1.35* 0.97  CALCIUM 8.9 8.6*  MG 2.2  --   AST 31  --   ALT 18  --   ALKPHOS 114  --   BILITOT 0.5  --     Microbiology Results   No results found for this or any previous visit (from the past 240 hour(s)).  RADIOLOGY:  Dg Chest 2 View  Result Date: 07/01/2016 CLINICAL DATA:  Abdominal swelling. History of filter or AAA abdomen repair. EXAM: CHEST  2 VIEW ABDOMEN KUB COMPARISON:  Chest x-ray dated 05/04/2016. FINDINGS: Heart size and mediastinal contours are normal, stable. Atherosclerotic changes noted at the aortic arch. Stable chronic scarring/atelectasis in the left lower lung. Mild edema and/or pleural thickening along the right minor fissure. Lungs otherwise clear. No pleural effusion or pneumothorax seen. IVC filter in place with tip at the L2 vertebral body level. Visualized bowel gas pattern is nonobstructive. Fairly large amount of stool throughout the grossly nondistended colon. No evidence of soft tissue mass or abnormal fluid collection. No evidence of free intraperitoneal air. Degenerative changes are seen throughout the thoracolumbar spine, at least moderate in degree, and at the bilateral hips. No acute or suspicious osseous finding. IMPRESSION: 1. Questionable mild pleural thickening/fluid along the right minor fissure. No evidence of pneumonia or pulmonary edema. 2. Nonobstructive bowel gas pattern. Fairly large amount of stool throughout the colon (constipation? ). 3. IVC filter with the tip at the L1-2 vertebral body level. 4. Aortic atherosclerosis. Electronically Signed   By: Bary RichardStan  Maynard M.D.   On: 07/01/2016 19:14   Dg Abdomen 1 View  Result Date: 07/01/2016 CLINICAL DATA:  Abdominal swelling. History of filter or AAA abdomen repair. EXAM: CHEST  2 VIEW ABDOMEN KUB COMPARISON:  Chest x-ray dated 05/04/2016. FINDINGS: Heart size and mediastinal contours are normal, stable. Atherosclerotic changes noted at the aortic arch. Stable  chronic scarring/atelectasis in the left lower lung. Mild edema and/or pleural thickening along the right minor fissure. Lungs otherwise clear. No pleural effusion or pneumothorax seen. IVC filter in place with tip at the L2 vertebral body level. Visualized bowel gas pattern is nonobstructive. Fairly large amount of stool throughout the grossly nondistended colon. No evidence of soft tissue mass or abnormal fluid collection. No evidence of free intraperitoneal air. Degenerative changes are seen throughout the thoracolumbar spine, at least  moderate in degree, and at the bilateral hips. No acute or suspicious osseous finding. IMPRESSION: 1. Questionable mild pleural thickening/fluid along the right minor fissure. No evidence of pneumonia or pulmonary edema. 2. Nonobstructive bowel gas pattern. Fairly large amount of stool throughout the colon (constipation? ). 3. IVC filter with the tip at the L1-2 vertebral body level. 4. Aortic atherosclerosis. Electronically Signed   By: Bary Richard M.D.   On: 07/01/2016 19:14   Ct Head Wo Contrast  Result Date: 07/01/2016 CLINICAL DATA:  Neck and upper back pain status post fall today. Hematoma to right occipital area. EXAM: CT HEAD WITHOUT CONTRAST CT CERVICAL SPINE WITHOUT CONTRAST TECHNIQUE: Multidetector CT imaging of the head and cervical spine was performed following the standard protocol without intravenous contrast. Multiplanar CT image reconstructions of the cervical spine were also generated. COMPARISON:  None. FINDINGS: CT HEAD FINDINGS Brain: There is generalized age related parenchymal atrophy with commensurate dilatation of the ventricles and sulci. Chronic small vessel ischemic changes noted within the bilateral periventricular and subcortical white matter. There is no mass, hemorrhage, edema or other evidence of acute parenchymal abnormality. No extra-axial hemorrhage. Vascular: There are chronic calcified atherosclerotic changes of the large vessels at the  skull base. No unexpected hyperdense vessel. Skull: Normal. Negative for fracture or focal lesion. Sinuses/Orbits: No acute finding. Other: Perhaps mild edema overlying the right occipital bone. No scalp hematoma appreciated. CT CERVICAL SPINE FINDINGS Alignment: Reversal of the normal cervical spine lordosis related to underlying degenerative changes. No evidence of acute subluxation seen. Skull base and vertebrae: No fracture line or displaced fracture fragment identified. Facet joints appear intact and normally aligned. Soft tissues and spinal canal: No prevertebral fluid or swelling. No visible canal hematoma. Disc levels: Extensive degenerative change throughout the cervical spine, with associated ankylosis at multiple levels and mild osseous spurring. Patient is status post surgical central canal decompression from the C3 through the C7 levels. Moderate to severe osseous neural foramen narrowings are seen at the C2-3 through C5-6 levels with possible associated nerve root impingements. Upper chest: Negative. Other: Atherosclerotic changes of the carotid arteries bilaterally. IMPRESSION: 1. No acute intracranial abnormality. No intracranial mass, hemorrhage or edema. No skull fracture. No scalp hematoma seen. Atrophy and chronic ischemic changes in the white matter. 2. No fracture or acute subluxation identified within the cervical spine. Degenerative and surgical changes described above. Electronically Signed   By: Bary Richard M.D.   On: 07/01/2016 19:06   Ct Cervical Spine Wo Contrast  Result Date: 07/01/2016 CLINICAL DATA:  Neck and upper back pain status post fall today. Hematoma to right occipital area. EXAM: CT HEAD WITHOUT CONTRAST CT CERVICAL SPINE WITHOUT CONTRAST TECHNIQUE: Multidetector CT imaging of the head and cervical spine was performed following the standard protocol without intravenous contrast. Multiplanar CT image reconstructions of the cervical spine were also generated. COMPARISON:   None. FINDINGS: CT HEAD FINDINGS Brain: There is generalized age related parenchymal atrophy with commensurate dilatation of the ventricles and sulci. Chronic small vessel ischemic changes noted within the bilateral periventricular and subcortical white matter. There is no mass, hemorrhage, edema or other evidence of acute parenchymal abnormality. No extra-axial hemorrhage. Vascular: There are chronic calcified atherosclerotic changes of the large vessels at the skull base. No unexpected hyperdense vessel. Skull: Normal. Negative for fracture or focal lesion. Sinuses/Orbits: No acute finding. Other: Perhaps mild edema overlying the right occipital bone. No scalp hematoma appreciated. CT CERVICAL SPINE FINDINGS Alignment: Reversal of the normal cervical spine lordosis  related to underlying degenerative changes. No evidence of acute subluxation seen. Skull base and vertebrae: No fracture line or displaced fracture fragment identified. Facet joints appear intact and normally aligned. Soft tissues and spinal canal: No prevertebral fluid or swelling. No visible canal hematoma. Disc levels: Extensive degenerative change throughout the cervical spine, with associated ankylosis at multiple levels and mild osseous spurring. Patient is status post surgical central canal decompression from the C3 through the C7 levels. Moderate to severe osseous neural foramen narrowings are seen at the C2-3 through C5-6 levels with possible associated nerve root impingements. Upper chest: Negative. Other: Atherosclerotic changes of the carotid arteries bilaterally. IMPRESSION: 1. No acute intracranial abnormality. No intracranial mass, hemorrhage or edema. No skull fracture. No scalp hematoma seen. Atrophy and chronic ischemic changes in the white matter. 2. No fracture or acute subluxation identified within the cervical spine. Degenerative and surgical changes described above. Electronically Signed   By: Bary Richard M.D.   On: 07/01/2016  19:06   US Venous Img Lower Unilateral Right  Result Date: 07/01/2016 CLINICAL DATA:  Right lower extremity edema for 2 weeks. EXAM: Right LOWER EXTREMITY VENOUS DOPPLER ULTRASOUND TECHNIQUE: Gray-scale sonography with graded compression, as well as color Doppler and duplex ultrasound were performed to evaluate the lower extremity deep venous systems from the level of the common femoral vein and including the common femoral, femoral, profunda femoral, popliteal and calf veins including the posterior tibial, peroneal and gastrocnemius veins when visible. The superficial great saphenous vein was also interrogated. Spectral Doppler was utilized to evaluate flow at rest and with distal augmentation maneuvers in the common femoral, femoral and popliteal veins. COMPARISON:  None. FINDINGS: Contralateral Common Femoral Vein: Respiratory phasicity is normal and symmetric with the symptomatic side. No evidence of thrombus. Normal compressibility. Common Femoral Vein: No evidence of thrombus. Normal compressibility, respiratory phasicity and response to augmentation. Saphenofemoral Junction: No evidence of thrombus. Normal compressibility and flow on color Doppler imaging. Profunda Femoral Vein: No evidence of thrombus. Normal compressibility and flow on color Doppler imaging. Femoral Vein: No evidence of thrombus. Normal compressibility, respiratory phasicity and response to augmentation. Popliteal Vein: No evidence of thrombus. Normal compressibility, respiratory phasicity and response to augmentation. Calf Veins: No evidence of thrombus. Normal compressibility and flow on color Doppler imaging. Superficial Great Saphenous Vein: No evidence of thrombus. Normal compressibility and flow on color Doppler imaging. Venous Reflux:  None. Other Findings:  None. IMPRESSION: No evidence of deep venous thrombosis. Electronically Signed   By: Ellery Plunk M.D.   On: 07/01/2016 19:54     Management plans discussed with the  patient, family and they are in agreement.  CODE STATUS:     Code Status Orders        Start     Ordered   07/01/16 2259  Full code  Continuous     07/01/16 2258    Code Status History    Date Active Date Inactive Code Status Order ID Comments User Context   10/19/2014  5:37 PM 10/20/2014  6:35 PM Full Code [id number]  Velna Hatchet, RN ED    Advance Directive Documentation   Flowsheet Row Most Recent Value  Type of Advance Directive  Healthcare Power of Attorney  Pre-existing out of facility DNR order (yellow form or pink MOST form)  No data  "MOST" Form in Place?  No data      TOTAL TIME TAKING CARE OF THIS PATIENT: 40 minutes.    Delwin Raczkowski M.D on 07/02/2016  at 10:00 AM  Between 7am to 6pm - Pager - [phone] After 6pm go to [email or web address] - Social research officer, government  [facility]  Office  [phone]  CC: Primary care physician; [facility]

## 2016-07-02 NOTE — Progress Notes (Signed)
Pt for discharge home. Alert. No resp distress.  02 1 l [place] sl site d/cd.  Discharge instructions discussed with pt.  presc called to pts pharmacy. Home meds discussed.  Diet activity and f/u discussed.   Pt verbalize  Understanding of  Discharge  Plans.  Pt home by taxi cab.

## 2016-07-03 LAB — HEMOGLOBIN A1C
Hgb A1c MFr Bld: 7 % — ABNORMAL HIGH (ref 4.8–5.6)
MEAN PLASMA GLUCOSE: 154 mg/dL

## 2016-08-13 ENCOUNTER — Encounter (INDEPENDENT_AMBULATORY_CARE_PROVIDER_SITE_OTHER): Payer: Medicare Other | Admitting: Vascular Surgery

## 2016-08-17 ENCOUNTER — Ambulatory Visit: Payer: Medicare Other | Attending: Internal Medicine

## 2016-08-17 DIAGNOSIS — G4761 Periodic limb movement disorder: Secondary | ICD-10-CM | POA: Insufficient documentation

## 2016-08-17 DIAGNOSIS — E669 Obesity, unspecified: Secondary | ICD-10-CM | POA: Insufficient documentation

## 2016-08-17 DIAGNOSIS — G4733 Obstructive sleep apnea (adult) (pediatric): Secondary | ICD-10-CM | POA: Diagnosis not present

## 2016-08-17 DIAGNOSIS — G4710 Hypersomnia, unspecified: Secondary | ICD-10-CM | POA: Diagnosis present

## 2016-08-18 ENCOUNTER — Other Ambulatory Visit: Payer: Self-pay | Admitting: Gastroenterology

## 2016-08-18 DIAGNOSIS — K219 Gastro-esophageal reflux disease without esophagitis: Secondary | ICD-10-CM

## 2016-08-18 DIAGNOSIS — R1310 Dysphagia, unspecified: Secondary | ICD-10-CM

## 2016-08-20 ENCOUNTER — Encounter (INDEPENDENT_AMBULATORY_CARE_PROVIDER_SITE_OTHER): Payer: Self-pay | Admitting: Vascular Surgery

## 2016-08-20 ENCOUNTER — Ambulatory Visit (INDEPENDENT_AMBULATORY_CARE_PROVIDER_SITE_OTHER): Payer: Medicare Other | Admitting: Vascular Surgery

## 2016-08-20 VITALS — BP 120/71 | HR 88 | Resp 16 | Ht <= 58 in | Wt 188.0 lb

## 2016-08-20 DIAGNOSIS — I10 Essential (primary) hypertension: Secondary | ICD-10-CM | POA: Diagnosis not present

## 2016-08-20 DIAGNOSIS — M79604 Pain in right leg: Secondary | ICD-10-CM

## 2016-08-20 DIAGNOSIS — M79609 Pain in unspecified limb: Secondary | ICD-10-CM | POA: Insufficient documentation

## 2016-08-20 DIAGNOSIS — J449 Chronic obstructive pulmonary disease, unspecified: Secondary | ICD-10-CM

## 2016-08-20 DIAGNOSIS — M7989 Other specified soft tissue disorders: Secondary | ICD-10-CM

## 2016-08-20 DIAGNOSIS — E118 Type 2 diabetes mellitus with unspecified complications: Secondary | ICD-10-CM

## 2016-08-20 DIAGNOSIS — E119 Type 2 diabetes mellitus without complications: Secondary | ICD-10-CM | POA: Insufficient documentation

## 2016-08-20 NOTE — Patient Instructions (Signed)
Lymphedema Lymphedema is swelling that is caused by the abnormal collection of lymph under the skin. Lymph is fluid from the tissues in your body that travels in the lymphatic system. This system is part of the immune system and includes lymph nodes and lymph vessels. The lymph vessels collect and carry the excess fluid, fats, proteins, and wastes from the tissues of the body to the bloodstream. This system also works to clean and remove bacteria and waste products from the body. Lymphedema occurs when the lymphatic system is blocked. When the lymph vessels or lymph nodes are blocked or damaged, lymph does not drain properly, causing an abnormal buildup of lymph. This leads to swelling in the arms or legs. Lymphedema cannot be cured by medicines, but various methods can be used to help reduce the swelling. What are the causes? There are two types of lymphedema. Primary lymphedema is caused by the absence or abnormality of the lymph vessel at birth. Secondary lymphedema is more common. It occurs when the lymph vessel is damaged or blocked. Common causes of lymph vessel blockage include:  Skin infection, such as cellulitis.  Infection by parasites (filariasis).  Injury.  Cancer.  Radiation therapy.  Formation of scar tissue.  Surgery. What are the signs or symptoms? Symptoms of this condition include:  Swelling of the arm or leg.  A heavy or tight feeling in the arm or leg.  Swelling of the feet, toes, or fingers. Shoes or rings may fit more tightly than before.  Redness of the skin over the affected area.  Limited movement of the affected limb.  Sensitivity to touch or discomfort in the affected limb. How is this diagnosed? This condition may be diagnosed with:  A physical exam.  Medical history.  Bioimpedance spectroscopy. In this test, painless electrical currents are used to measure fluid levels in your body.  Imaging tests, such as:  Lymphoscintigraphy. In this test, a  low dose of a radioactive substance is injected to trace the flow of lymph through the lymph vessels.  MRI.  CT scan.  Duplex ultrasound. This test uses sound waves to produce images of the vessels and the blood flow on a screen.  Lymphangiography. In this test, a contrast dye is injected into the lymph vessel to help show blockages. How is this treated? Treatment for this condition may depend on the cause. Treatment may include:  Exercise. Certain exercises can help fluid move out of the affected limb.  Massage. Gentle massage of the affected limb can help move the fluid out of the area.  Compression. Various methods may be used to apply pressure to the affected limb in order to reduce the swelling.  Wearing compression stockings or sleeves on the affected limb.  Bandaging the affected limb.  Using an external pump that is attached to a sleeve that alternates between applying pressure and releasing pressure.  Surgery. This is usually only done for severe cases. For example, surgery may be done if you have trouble moving the limb or if the swelling does not get better with other treatments. If an underlying condition is causing the lymphedema, treatment for that condition is needed. For example, antibiotic medicines may be used to treat an infection. Follow these instructions at home: Activities   Exercise regularly as directed by your health care provider.  Do not sit with your legs crossed.  When possible, keep the affected limb raised (elevated) above the level of your heart.  Avoid carrying things with an arm that is   affected by lymphedema.  Remember that the affected area is more likely to become injured or infected.  Take these steps to help prevent infection:  Keep the affected area clean and dry.  Protect your skin from cuts. For example, you should use gloves while cooking or gardening. Do not walk barefoot. If you shave the affected area, use an electric  razor. General instructions   Take medicines only as directed by your health care provider.  Eat a healthy diet that includes a lot of fruits and vegetables.  Do not wear tight clothes, shoes, or jewelry.  Do not use heating pads over the affected area.  Avoid having blood pressure checked on the affected limb.  Keep all follow-up visits as directed by your health care provider. This is important. Contact a health care provider if:  You continue to have swelling in your limb.  You have a fever.  You have a cut that does not heal.  You have redness or pain in the affected area.  You have new swelling in your limb that comes on suddenly.  You develop purplish spots or sores (lesions) on your limb. Get help right away if:  You have a skin rash.  You have chills or sweats.  You have shortness of breath. This information is not intended to replace advice given to you by your health care provider. Make sure you discuss any questions you have with your health care provider. Document Released: 03/14/2007 Document Revised: 01/22/2016 Document Reviewed: 04/24/2014 Elsevier Interactive Patient Education  2017 [employer]  

## 2016-08-20 NOTE — Assessment & Plan Note (Signed)

## 2016-08-20 NOTE — Assessment & Plan Note (Signed)
Likely multifactorial, but the swelling is certainly a likely contributing factor. Venous disease is a possibility and she has the appearance of lymphedema.

## 2016-08-20 NOTE — Progress Notes (Signed)
Patient ID: Kristen Watts, female   DOB: 02-24-1941, 76 y.o.   MRN: [id number]  Chief Complaint  Patient presents with  . New Patient (Initial Visit)    HPI Kristen Watts is a 76 y.o. female.  I am asked to see the patient by Dr. Ginette Pitman for evaluation of pain and swelling in the right leg.  The patient reports several months of worsening pain and swelling in that right leg. She reports a previous broken bones in the foot as well as a previous right knee surgery but denies any known history of DVT or superficial thrombophlebitis to her knowledge. This pain is a dull aching pain that is present most days. She denies any clear things that make it better, and standing or sitting for long periods of time seem to make it worse. She has had varicose veins on the left leg that are prominent, but the left leg is not nearly as painful. She does not have ulceration or infection. She denies fever or chills.   Past Medical History:  Diagnosis Date  . Arthritis   . Asthma   . Blood transfusion without reported diagnosis   . CHF (congestive heart failure) ([place])   . COPD (chronic obstructive pulmonary disease) ([place])   . Coronary artery disease   . Diabetes mellitus without complication ([place])   . Hypertension     Past Surgical History:  Procedure Laterality Date  . ABDOMINAL HYSTERECTOMY    . BACK SURGERY    . BREAST BIOPSY Right    neg  . BREAST SURGERY    . CARDIAC SURGERY    . CHOLECYSTECTOMY    . FRACTURE SURGERY    . TONSILLECTOMY      Family History  Problem Relation Age of Onset  . Breast cancer Neg Hx   No history of bleeding disorders, clotting disorders, autoimmune diseases, or aneurysms  Social History Social History  Substance Use Topics  . Smoking status: Never Smoker  . Smokeless tobacco: Never Used  . Alcohol use No  No IV drug use  Allergies  Allergen Reactions  . Stadol [Butorphanol] Other (See Comments)    LOC  . Darvon [Propoxyphene] Rash  . Demerol  [Meperidine] Palpitations  . Latex Rash    Current Outpatient Prescriptions  Medication Sig Dispense Refill  . ADVAIR DISKUS 250-50 MCG/DOSE AEPB INHALE 1 PUFF INTO THE LUNGS EVERY 12 HOURS  5  . albuterol (PROVENTIL HFA;VENTOLIN HFA) 108 (90 Base) MCG/ACT inhaler Inhale 1-2 puffs into the lungs every 4 (four) hours as needed for wheezing or shortness of breath.    Marland Kitchen albuterol (PROVENTIL) (2.5 MG/3ML) 0.083% nebulizer solution Take 2.5 mg by nebulization every 4 (four) hours as needed for wheezing or shortness of breath.    . ALPRAZolam (XANAX) 0.25 MG tablet Take 0.25 mg by mouth at bedtime as needed. for sleep  3  . amoxicillin-clavulanate (AUGMENTIN) 875-125 MG tablet Take 1 tablet by mouth every 12 (twelve) hours. 14 tablet 0  . carvedilol (COREG) 6.25 MG tablet Take 6.25 mg by mouth 2 (two) times daily with a meal.    . celecoxib (CELEBREX) 200 MG capsule Take 200 mg by mouth daily.  5  . clotrimazole-betamethasone (LOTRISONE) cream Apply topically 2 (two) times daily.  2  . DEXILANT 60 MG capsule Take 1 capsule by mouth daily.  6  . furosemide (LASIX) 40 MG tablet TAKE ONE TABLET BY MOUTH EVERY DAY AS NEEDED FOR EDEMA  6  . ibuprofen (  ADVIL,MOTRIN) 600 MG tablet Take 1 tablet (600 mg total) by mouth every 8 (eight) hours as needed. 30 tablet 0  . isosorbide mononitrate (IMDUR) 60 MG 24 hr tablet Take 60 mg by mouth daily.  5  . metFORMIN (GLUCOPHAGE) 500 MG tablet TAKE ONE TABLET BY MOUTH EVERY MORNING WITH BREAKFAST  6  . nitroGLYCERIN (NITROSTAT) 0.4 MG SL tablet Place 0.4 mg under the tongue every 5 (five) minutes as needed for chest pain.    Marland Kitchen nystatin cream (MYCOSTATIN) Apply 1 application topically 2 (two) times daily. 60 g 1  . omega-3 acid ethyl esters (LOVAZA) 1 g capsule Take 1 capsule by mouth 2 (two) times daily.  6  . predniSONE (DELTASONE) 20 MG tablet Take 3 tablets (60 mg total) by mouth daily. 15 tablet 0  . SYNTHROID 150 MCG tablet Take 150 mcg by mouth every morning.   3  . valsartan (DIOVAN) 160 MG tablet Take 1 tablet (160 mg total) by mouth once daily.  3  . citalopram (CELEXA) 10 MG tablet Take 1 tablet (10 mg total) by mouth daily. 30 tablet 2  . risperiDONE (RISPERDAL) 1 MG tablet Take 1 tablet (1 mg total) by mouth at bedtime. 30 tablet 1   Current Facility-Administered Medications  Medication Dose Route Frequency Provider Last Rate Last Dose  . betamethasone acetate-betamethasone sodium phosphate (CELESTONE) injection 3 mg  3 mg Intramuscular Once Edrick Kins, DPM          REVIEW OF SYSTEMS (Negative unless checked)  Constitutional: '[]'$ Weight loss  '[]'$ Fever  '[]'$ Chills Cardiac: '[]'$ Chest pain   '[]'$ Chest pressure   '[]'$ Palpitations   '[]'$ Shortness of breath when laying flat   '[]'$ Shortness of breath at rest   '[]'$ Shortness of breath with exertion. Vascular:  '[]'$ Pain in legs with walking   '[x]'$ Pain in legs at rest   '[]'$ Pain in legs when laying flat   '[]'$ Claudication   '[]'$ Pain in feet when walking  '[]'$ Pain in feet at rest  '[]'$ Pain in feet when laying flat   '[]'$ History of DVT   '[]'$ Phlebitis   '[x]'$ Swelling in legs   '[x]'$ Varicose veins   '[]'$ Non-healing ulcers Pulmonary:   '[]'$ Uses home oxygen   '[]'$ Productive cough   '[]'$ Hemoptysis   '[]'$ Wheeze  '[]'$ COPD   '[]'$ Asthma Neurologic:  '[]'$ Dizziness  '[]'$ Blackouts   '[]'$ Seizures   '[]'$ History of stroke   '[]'$ History of TIA  '[]'$ Aphasia   '[]'$ Temporary blindness   '[]'$ Dysphagia   '[]'$ Weakness or numbness in arms   '[]'$ Weakness or numbness in legs Musculoskeletal:  '[]'$ Arthritis   '[]'$ Joint swelling   '[]'$ Joint pain   '[]'$ Low back pain Hematologic:  '[]'$ Easy bruising  '[]'$ Easy bleeding   '[]'$ Hypercoagulable state   '[]'$ Anemic  '[]'$ Hepatitis Gastrointestinal:  '[]'$ Blood in stool   '[]'$ Vomiting blood  '[]'$ Gastroesophageal reflux/heartburn   '[]'$ Abdominal pain Genitourinary:  '[]'$ Chronic kidney disease   '[]'$ Difficult urination  '[]'$ Frequent urination  '[]'$ Burning with urination   '[]'$ Hematuria Skin:  '[x]'$ Rashes   '[]'$ Ulcers   '[]'$ Wounds Psychological:  '[]'$ History of anxiety   '[]'$  History of major  depression.    Physical Exam BP 120/71   Pulse 88   Resp 16   Ht '4\' 10"'$  (1.473 m)   Wt 188 lb (85.3 kg)   BMI 39.29 kg/m  Gen:  WD/WN, NAD Head: [place]/AT, No temporalis wasting.  Ear/Nose/Throat: Hearing grossly intact, nares w/o erythema or drainage, oropharynx w/o Erythema/Exudate Eyes: Conjunctiva clear, sclera non-icteric  Neck: trachea midline.  No JVD.  Pulmonary:  Good air movement, respirations not labored, no use of accessory  muscles Cardiac: RRR Vascular: Moderate stasis dermatitis is present in the right leg. Varicosities are scattered in the right leg. Prominent varicosities are present in the left leg. Vessel Right Left  Radial Palpable Palpable  Ulnar Palpable Palpable  Brachial Palpable Palpable  Carotid Palpable, without bruit Palpable, without bruit  Aorta Not palpable N/A  Femoral Palpable Palpable  Popliteal Palpable Palpable  PT Not Palpable 1+ Palpable  DP 1+ Palpable Palpable   Gastrointestinal: soft, non-tender/non-distended.  Musculoskeletal: M/S 5/5 throughout.  Extremities without ischemic changes.  No deformity or atrophy. 2+ right lower extremity edema, trace left lower extremity edema. Neurologic: Sensation grossly intact in extremities.  Symmetrical.  Speech is fluent. Motor exam as listed above. Psychiatric: Judgment intact, Mood & affect appropriate for pt's clinical situation. Dermatologic: No rashes or ulcers noted.  No cellulitis or open wounds. Lymph : No Cervical, Axillary, or Inguinal lymphadenopathy.   Radiology No results found.  Labs Recent Results (from the past 2160 hour(s))  Basic metabolic panel     Status: Abnormal   Collection Time: 07/01/16  6:24 PM  Result Value Ref Range   Sodium 137 135 - 145 mmol/L   Potassium 4.2 3.5 - 5.1 mmol/L   Chloride 100 (L) 101 - 111 mmol/L   CO2 28 22 - 32 mmol/L   Glucose, Bld 147 (H) 65 - 99 mg/dL   BUN 24 (H) 6 - 20 mg/dL   Creatinine, Ser 1.35 (H) 0.44 - 1.00 mg/dL   Calcium 8.9 8.9 -  10.3 mg/dL   GFR calc non Af Amer 38 (L) >60 mL/min   GFR calc Af Amer 44 (L) >60 mL/min    Comment: (NOTE) The eGFR has been calculated using the CKD EPI equation. This calculation has not been validated in all clinical situations. eGFR's persistently <60 mL/min signify possible Chronic Kidney Disease.    Anion gap 9 5 - 15  CBC     Status: Abnormal   Collection Time: 07/01/16  6:24 PM  Result Value Ref Range   WBC 9.2 3.6 - 11.0 K/uL   RBC 4.01 3.80 - 5.20 MIL/uL   Hemoglobin 10.5 (L) 12.0 - 16.0 g/dL   HCT 32.5 (L) 35.0 - 47.0 %   MCV 81.0 80.0 - 100.0 fL   MCH 26.1 26.0 - 34.0 pg   MCHC 32.3 32.0 - 36.0 g/dL   RDW 15.0 (H) 11.5 - 14.5 %   Platelets 198 150 - 440 K/uL  Troponin I     Status: None   Collection Time: 07/01/16  6:24 PM  Result Value Ref Range   Troponin I <0.03 <0.03 ng/mL  Hepatic function panel     Status: None   Collection Time: 07/01/16  6:24 PM  Result Value Ref Range   Total Protein 8.0 6.5 - 8.1 g/dL   Albumin 3.7 3.5 - 5.0 g/dL   AST 31 15 - 41 U/L   ALT 18 14 - 54 U/L   Alkaline Phosphatase 114 38 - 126 U/L   Total Bilirubin 0.5 0.3 - 1.2 mg/dL   Bilirubin, Direct 0.1 0.1 - 0.5 mg/dL   Indirect Bilirubin 0.4 0.3 - 0.9 mg/dL  Differential     Status: None   Collection Time: 07/01/16  6:24 PM  Result Value Ref Range   Neutrophils Relative % 65 %   Neutro Abs 5.9 1.4 - 6.5 K/uL   Lymphocytes Relative 21 %   Lymphs Abs 2.0 1.0 - 3.6 K/uL   Monocytes Relative 8 %  Monocytes Absolute 0.8 0.2 - 0.9 K/uL   Eosinophils Relative 5 %   Eosinophils Absolute 0.5 0 - 0.7 K/uL   Basophils Relative 1 %   Basophils Absolute 0.0 0 - 0.1 K/uL  Magnesium     Status: None   Collection Time: 07/01/16  6:24 PM  Result Value Ref Range   Magnesium 2.2 1.7 - 2.4 mg/dL  Phosphorus     Status: None   Collection Time: 07/01/16  6:24 PM  Result Value Ref Range   Phosphorus 3.7 2.5 - 4.6 mg/dL  TSH     Status: None   Collection Time: 07/01/16  6:24 PM  Result  Value Ref Range   TSH 1.458 0.350 - 4.500 uIU/mL    Comment: Performed by a 3rd Generation assay with a functional sensitivity of <=0.01 uIU/mL.  Urinalysis, Complete w Microscopic     Status: Abnormal   Collection Time: 07/01/16  8:05 PM  Result Value Ref Range   Color, Urine YELLOW (A) YELLOW   APPearance CLEAR (A) CLEAR   Specific Gravity, Urine 1.012 1.005 - 1.030   pH 6.0 5.0 - 8.0   Glucose, UA NEGATIVE NEGATIVE mg/dL   Hgb urine dipstick NEGATIVE NEGATIVE   Bilirubin Urine NEGATIVE NEGATIVE   Ketones, ur NEGATIVE NEGATIVE mg/dL   Protein, ur NEGATIVE NEGATIVE mg/dL   Nitrite NEGATIVE NEGATIVE   Leukocytes, UA NEGATIVE NEGATIVE   RBC / HPF 0-5 0 - 5 RBC/hpf   WBC, UA 0-5 0 - 5 WBC/hpf   Bacteria, UA NONE SEEN NONE SEEN   Squamous Epithelial / LPF 0-5 (A) NONE SEEN  Brain natriuretic peptide     Status: Abnormal   Collection Time: 07/01/16  8:10 PM  Result Value Ref Range   B Natriuretic Peptide 161.0 (H) 0.0 - 100.0 pg/mL  Glucose, capillary     Status: Abnormal   Collection Time: 07/01/16 11:35 PM  Result Value Ref Range   Glucose-Capillary 124 (H) 65 - 99 mg/dL  Hemoglobin A1c     Status: Abnormal   Collection Time: 07/01/16 11:38 PM  Result Value Ref Range   Hgb A1c MFr Bld 7.0 (H) 4.8 - 5.6 %    Comment: (NOTE)         Pre-diabetes: 5.7 - 6.4         Diabetes: >6.4         Glycemic control for adults with diabetes: <7.0    Mean Plasma Glucose 154 mg/dL    Comment: (NOTE) Performed At: [facility] [place], [place] [id number] Lindon Romp MD [id number]   Troponin I     Status: None   Collection Time: 07/01/16 11:38 PM  Result Value Ref Range   Troponin I <0.03 <0.03 ng/mL  Basic metabolic panel     Status: Abnormal   Collection Time: 07/02/16  7:14 AM  Result Value Ref Range   Sodium 138 135 - 145 mmol/L   Potassium 4.1 3.5 - 5.1 mmol/L   Chloride 104 101 - 111 mmol/L   CO2 28 22 - 32 mmol/L   Glucose, Bld 124 (H) 65 - 99  mg/dL   BUN 17 6 - 20 mg/dL   Creatinine, Ser 0.97 0.44 - 1.00 mg/dL   Calcium 8.6 (L) 8.9 - 10.3 mg/dL   GFR calc non Af Amer 56 (L) >60 mL/min   GFR calc Af Amer >60 >60 mL/min    Comment: (NOTE) The eGFR has been calculated using the CKD EPI  equation. This calculation has not been validated in all clinical situations. eGFR's persistently <60 mL/min signify possible Chronic Kidney Disease.    Anion gap 6 5 - 15  CBC     Status: Abnormal   Collection Time: 07/02/16  7:14 AM  Result Value Ref Range   WBC 6.4 3.6 - 11.0 K/uL   RBC 3.83 3.80 - 5.20 MIL/uL   Hemoglobin 10.0 (L) 12.0 - 16.0 g/dL   HCT 30.9 (L) 35.0 - 47.0 %   MCV 80.5 80.0 - 100.0 fL   MCH 26.2 26.0 - 34.0 pg   MCHC 32.5 32.0 - 36.0 g/dL   RDW 15.3 (H) 11.5 - 14.5 %   Platelets 156 150 - 440 K/uL  Troponin I     Status: None   Collection Time: 07/02/16  7:14 AM  Result Value Ref Range   Troponin I <0.03 <0.03 ng/mL  Glucose, capillary     Status: Abnormal   Collection Time: 07/02/16  7:33 AM  Result Value Ref Range   Glucose-Capillary 122 (H) 65 - 99 mg/dL  Glucose, capillary     Status: Abnormal   Collection Time: 07/02/16 11:39 AM  Result Value Ref Range   Glucose-Capillary 101 (H) 65 - 99 mg/dL    Assessment/Plan:  Diabetes (HCC) blood glucose control important in reducing the progression of atherosclerotic disease. Also, involved in wound healing. On appropriate medications.   Essential hypertension, benign blood pressure control important in reducing the progression of atherosclerotic disease. On appropriate oral medications.   COPD (chronic obstructive pulmonary disease) (HCC) On inhaled and oral agents  Pain in limb Likely multifactorial, but the swelling is certainly a likely contributing factor. Venous disease is a possibility and she has the appearance of lymphedema.  Swelling of limb I have had a long discussion with the patient regarding swelling and why it  causes symptoms.  Patient  will begin wearing graduated compression stockings class 1 (20-30 mmHg) on a daily basis a prescription was given. The patient will  beginning wearing the stockings first thing in the morning and removing them in the evening. The patient is instructed specifically not to sleep in the stockings.   In addition, behavioral modification will be initiated.  This will include frequent elevation, use of over the counter pain medications and exercise such as walking.  I have reviewed systemic causes for chronic edema such as liver, kidney and cardiac etiologies.  The patient denies problems with these organ systems.    Consideration for a lymph pump will also be made based upon the effectiveness of conservative therapy.  This would help to improve the edema control and prevent sequela such as ulcers and infections   Patient should undergo duplex ultrasound of the venous system to ensure that DVT or reflux is not present.  The patient will follow-up with me after the ultrasound.        Leotis Pain 08/20/2016, 12:29 PM   This note was created with Dragon medical transcription system.  Any errors from dictation are unintentional.

## 2016-08-20 NOTE — Assessment & Plan Note (Signed)
blood pressure control important in reducing the progression of atherosclerotic disease. On appropriate oral medications.  

## 2016-08-20 NOTE — Assessment & Plan Note (Signed)
blood glucose control important in reducing the progression of atherosclerotic disease. Also, involved in wound healing. On appropriate medications.  

## 2016-08-20 NOTE — Assessment & Plan Note (Signed)
On inhaled and oral agents

## 2016-08-27 ENCOUNTER — Ambulatory Visit
Admission: RE | Admit: 2016-08-27 | Discharge: 2016-08-27 | Disposition: A | Discharge status: Home / Self Care | Payer: Medicare Other | Source: Ambulatory Visit | Attending: Gastroenterology | Admitting: Gastroenterology

## 2016-09-01 ENCOUNTER — Ambulatory Visit
Admission: RE | Admit: 2016-09-01 | Discharge: 2016-09-01 | Disposition: A | Discharge status: Home / Self Care | Payer: Medicare Other | Source: Ambulatory Visit | Attending: Gastroenterology | Admitting: Gastroenterology

## 2016-09-01 DIAGNOSIS — K219 Gastro-esophageal reflux disease without esophagitis: Secondary | ICD-10-CM | POA: Diagnosis not present

## 2016-09-01 DIAGNOSIS — R1310 Dysphagia, unspecified: Secondary | ICD-10-CM | POA: Insufficient documentation

## 2016-09-26 ENCOUNTER — Encounter: Payer: Self-pay | Admitting: Emergency Medicine

## 2016-09-26 ENCOUNTER — Emergency Department: Payer: Medicare Other

## 2016-09-26 ENCOUNTER — Emergency Department
Admission: EM | Admit: 2016-09-26 | Discharge: 2016-09-26 | Disposition: A | Discharge status: Home / Self Care | Payer: Medicare Other | Attending: Emergency Medicine | Admitting: Emergency Medicine

## 2016-09-26 DIAGNOSIS — R42 Dizziness and giddiness: Secondary | ICD-10-CM | POA: Diagnosis not present

## 2016-09-26 DIAGNOSIS — Z7982 Long term (current) use of aspirin: Secondary | ICD-10-CM | POA: Diagnosis not present

## 2016-09-26 DIAGNOSIS — R2689 Other abnormalities of gait and mobility: Secondary | ICD-10-CM | POA: Insufficient documentation

## 2016-09-26 DIAGNOSIS — I110 Hypertensive heart disease with heart failure: Secondary | ICD-10-CM | POA: Insufficient documentation

## 2016-09-26 DIAGNOSIS — J449 Chronic obstructive pulmonary disease, unspecified: Secondary | ICD-10-CM | POA: Insufficient documentation

## 2016-09-26 DIAGNOSIS — E119 Type 2 diabetes mellitus without complications: Secondary | ICD-10-CM | POA: Insufficient documentation

## 2016-09-26 DIAGNOSIS — Z79899 Other long term (current) drug therapy: Secondary | ICD-10-CM | POA: Diagnosis not present

## 2016-09-26 DIAGNOSIS — I2510 Atherosclerotic heart disease of native coronary artery without angina pectoris: Secondary | ICD-10-CM | POA: Diagnosis not present

## 2016-09-26 DIAGNOSIS — Z87891 Personal history of nicotine dependence: Secondary | ICD-10-CM | POA: Insufficient documentation

## 2016-09-26 DIAGNOSIS — J45909 Unspecified asthma, uncomplicated: Secondary | ICD-10-CM | POA: Insufficient documentation

## 2016-09-26 DIAGNOSIS — Z7984 Long term (current) use of oral hypoglycemic drugs: Secondary | ICD-10-CM | POA: Diagnosis not present

## 2016-09-26 DIAGNOSIS — I509 Heart failure, unspecified: Secondary | ICD-10-CM | POA: Diagnosis not present

## 2016-09-26 DIAGNOSIS — R55 Syncope and collapse: Secondary | ICD-10-CM | POA: Diagnosis present

## 2016-09-26 LAB — URINALYSIS, COMPLETE (UACMP) WITH MICROSCOPIC
BACTERIA UA: NONE SEEN
BILIRUBIN URINE: NEGATIVE
Glucose, UA: NEGATIVE mg/dL
HGB URINE DIPSTICK: NEGATIVE
Ketones, ur: NEGATIVE mg/dL
LEUKOCYTES UA: NEGATIVE
Nitrite: NEGATIVE
PROTEIN: NEGATIVE mg/dL
Specific Gravity, Urine: 1.006 (ref 1.005–1.030)
WBC, UA: NONE SEEN WBC/hpf (ref 0–5)
pH: 7 (ref 5.0–8.0)

## 2016-09-26 LAB — CBC
HEMATOCRIT: 36.3 % (ref 35.0–47.0)
HEMOGLOBIN: 11.7 g/dL — AB (ref 12.0–16.0)
MCH: 25.8 pg — ABNORMAL LOW (ref 26.0–34.0)
MCHC: 32.4 g/dL (ref 32.0–36.0)
MCV: 79.6 fL — AB (ref 80.0–100.0)
Platelets: 225 10*3/uL (ref 150–440)
RBC: 4.56 MIL/uL (ref 3.80–5.20)
RDW: 15.6 % — ABNORMAL HIGH (ref 11.5–14.5)
WBC: 10.2 10*3/uL (ref 3.6–11.0)

## 2016-09-26 LAB — BASIC METABOLIC PANEL
ANION GAP: 10 (ref 5–15)
BUN: 33 mg/dL — AB (ref 6–20)
CHLORIDE: 95 mmol/L — AB (ref 101–111)
CO2: 31 mmol/L (ref 22–32)
Calcium: 9.4 mg/dL (ref 8.9–10.3)
Creatinine, Ser: 1.18 mg/dL — ABNORMAL HIGH (ref 0.44–1.00)
GFR calc non Af Amer: 44 mL/min — ABNORMAL LOW (ref >60)
GFR, EST AFRICAN AMERICAN: 51 mL/min — AB (ref >60)
Glucose, Bld: 130 mg/dL — ABNORMAL HIGH (ref 65–99)
POTASSIUM: 5.2 mmol/L — AB (ref 3.5–5.1)
SODIUM: 136 mmol/L (ref 135–145)

## 2016-09-26 LAB — ETHANOL: Alcohol, Ethyl (B): <5 mg/dL (ref <5)

## 2016-09-26 LAB — GLUCOSE, CAPILLARY: Glucose-Capillary: 132 mg/dL — ABNORMAL HIGH (ref 65–99)

## 2016-09-26 MED ORDER — SODIUM CHLORIDE 0.9 % IV BOLUS (SEPSIS)
1000.0000 mL | Freq: Once | INTRAVENOUS | Status: AC
  Administered 2016-09-26: 1000 mL via INTRAVENOUS

## 2016-09-26 MED ORDER — IOPAMIDOL (ISOVUE-370) INJECTION 76%
60.0000 mL | Freq: Once | INTRAVENOUS | Status: AC | PRN
  Administered 2016-09-26: 60 mL via INTRAVENOUS

## 2016-09-26 MED ORDER — MECLIZINE HCL 25 MG PO TABS
25.0000 mg | ORAL_TABLET | Freq: Once | ORAL | Status: AC
  Administered 2016-09-26: 25 mg via ORAL
  Filled 2016-09-26: qty 1

## 2016-09-26 NOTE — ED Notes (Signed)
Pt. Going home via [place].

## 2016-09-26 NOTE — ED Notes (Signed)
Patient transported to CT 

## 2016-09-26 NOTE — ED Notes (Signed)
Pt. Able to drink 12 oz of water with no difficulty.  Pt. States he head feels much better.

## 2016-09-26 NOTE — ED Triage Notes (Signed)
Pt arrived via EMS from home with reports of a near syncopal episode today. Pt states she is being treated for cellulitis in the RLE and started on Clindamycin yesterday. Pt states she is having SE from meds. PT c/o lightheadedness and tongue swelling. She also states she is "dizzy as a monkey" Pt given 3 Albuterol nebs with EMS for wheezing in the L lobe.  Has hx of COPD. Pt makes bizarre statements occasionally, but is alert and oriented x4.

## 2016-09-26 NOTE — ED Notes (Signed)
Pt states she was hallucinating.

## 2016-09-26 NOTE — ED Provider Notes (Signed)
[facility] Emergency Department Provider Note  ____________________________________________  Time seen: Approximately 5:59 PM  I have reviewed the triage vital signs and the nursing notes.   HISTORY  Chief Complaint Near Syncope  Level 5 caveat:  Portions of the history and physical were unable to be obtained due to the patient's poor historian  HPI Kristen Watts is a 76 y.o. female who complains of dizziness that started suddenly at about 4:00 PM today. She thinks it is related to clindamycin which she just started taking for a skin infection on her leg. She reports she was in her usual state of health when she woke up this morning. She tooka dose of clindamycin at 9:30 AM with breakfast. She then took another dose at about 3:30 PM. She did not eat lunch which is her usual. About 30 minutes after taking the second dose, she started feeling lightheaded. She denies any nausea vomiting diarrhea belly pain chest pain or shortness of breath. She also feels unsteady on her feet and feels like she is "about to start" having a posterior headache. No vision changes paresthesias or weakness.     Past Medical History:  Diagnosis Date  . Arthritis   . Asthma   . Blood transfusion without reported diagnosis   . CHF (congestive heart failure) (HCC)   . COPD (chronic obstructive pulmonary disease) (HCC)   . Coronary artery disease   . Diabetes mellitus without complication (HCC)   . Hypertension      Patient Active Problem List   Diagnosis Date Noted  . Diabetes (HCC) 08/20/2016  . Essential hypertension, benign 08/20/2016  . COPD (chronic obstructive pulmonary disease) (HCC) 08/20/2016  . Pain in limb 08/20/2016  . Swelling of limb 08/20/2016  . Syncope 07/01/2016     Past Surgical History:  Procedure Laterality Date  . ABDOMINAL HYSTERECTOMY    . BACK SURGERY    . BREAST BIOPSY Right    neg  . BREAST SURGERY    . CARDIAC SURGERY    . CHOLECYSTECTOMY     . FRACTURE SURGERY    . TONSILLECTOMY       Prior to Admission medications   Medication Sig Start Date End Date Taking? Authorizing Provider  ADVAIR DISKUS 250-50 MCG/DOSE AEPB INHALE 1 PUFF INTO THE LUNGS EVERY 12 HOURS 02/12/16   Historical Provider, MD  albuterol (PROVENTIL HFA;VENTOLIN HFA) 108 (90 Base) MCG/ACT inhaler Inhale 1-2 puffs into the lungs every 4 (four) hours as needed for wheezing or shortness of breath.    Historical Provider, MD  albuterol (PROVENTIL) (2.5 MG/3ML) 0.083% nebulizer solution Take 2.5 mg by nebulization every 4 (four) hours as needed for wheezing or shortness of breath.    Historical Provider, MD  ALPRAZolam Prudy Feeler) 0.25 MG tablet Take 0.25 mg by mouth at bedtime as needed. for sleep 02/27/16   Historical Provider, MD  amoxicillin-clavulanate (AUGMENTIN) 875-125 MG tablet Take 1 tablet by mouth every 12 (twelve) hours. 07/02/16   Enedina Finner, MD  carvedilol (COREG) 6.25 MG tablet Take 6.25 mg by mouth 2 (two) times daily with a meal.    Historical Provider, MD  celecoxib (CELEBREX) 200 MG capsule Take 200 mg by mouth daily. 03/05/16   Historical Provider, MD  citalopram (CELEXA) 10 MG tablet Take 1 tablet (10 mg total) by mouth daily. 10/20/14 10/20/15  Jene Every, MD  clotrimazole-betamethasone (LOTRISONE) cream Apply topically 2 (two) times daily. 01/07/16   Historical Provider, MD  DEXILANT 60 MG capsule Take 1  capsule by mouth daily. 03/05/16   Historical Provider, MD  furosemide (LASIX) 40 MG tablet TAKE ONE TABLET BY MOUTH EVERY DAY AS NEEDED FOR EDEMA 02/12/16   Historical Provider, MD  ibuprofen (ADVIL,MOTRIN) 600 MG tablet Take 1 tablet (600 mg total) by mouth every 8 (eight) hours as needed. 11/15/15   Tommi Rumps, PA-C  isosorbide mononitrate (IMDUR) 60 MG 24 hr tablet Take 60 mg by mouth daily. 02/12/16   Historical Provider, MD  metFORMIN (GLUCOPHAGE) 500 MG tablet TAKE ONE TABLET BY MOUTH EVERY MORNING WITH BREAKFAST 03/12/16   Historical Provider, MD   nitroGLYCERIN (NITROSTAT) 0.4 MG SL tablet Place 0.4 mg under the tongue every 5 (five) minutes as needed for chest pain.    Historical Provider, MD  nystatin cream (MYCOSTATIN) Apply 1 application topically 2 (two) times daily. 11/06/15   Tommi Rumps, PA-C  omega-3 acid ethyl esters (LOVAZA) 1 g capsule Take 1 capsule by mouth 2 (two) times daily. 02/23/16   Historical Provider, MD  predniSONE (DELTASONE) 20 MG tablet Take 3 tablets (60 mg total) by mouth daily. 05/04/16   Anne-Caroline Sharma Covert, MD  risperiDONE (RISPERDAL) 1 MG tablet Take 1 tablet (1 mg total) by mouth at bedtime. 10/20/14 10/20/15  Jene Every, MD  SYNTHROID 150 MCG tablet Take 150 mcg by mouth every morning. 03/05/16   Historical Provider, MD  valsartan (DIOVAN) 160 MG tablet Take 1 tablet (160 mg total) by mouth once daily. 02/12/16   Historical Provider, MD     Allergies Stadol [butorphanol]; Darvon [propoxyphene]; Demerol [meperidine]; and Latex   Family History  Problem Relation Age of Onset  . Breast cancer Neg Hx     Social History Social History  Substance Use Topics  . Smoking status: Former Games developer  . Smokeless tobacco: Never Used  . Alcohol use No    Review of Systems  Constitutional:   No fever or chills.  ENT:   No sore throat. No rhinorrhea. Lymphatic: No swollen glands, No extremity swelling Endocrine: No hot/cold flashes. No significant weight change. No neck swelling. Cardiovascular:   No chest pain or syncope. Respiratory:   No dyspnea or cough. Gastrointestinal:   Negative for abdominal pain, vomiting and diarrhea.  Genitourinary:   Negative for dysuria or difficulty urinating. Musculoskeletal:   Negative for focal pain or swelling Neurological:   Negative for headaches or weakness.Positive dizziness and feeling unsteady on feet All other systems reviewed and are negative except as documented above in ROS and HPI.  ____________________________________________   PHYSICAL EXAM:  VITAL  SIGNS: ED Triage Vitals  Enc Vitals Group     BP 09/26/16 1728 105/63     Pulse Rate 09/26/16 1728 75     Resp 09/26/16 1728 18     Temp 09/26/16 1728 97.6 F (36.4 C)     Temp Source 09/26/16 1728 Oral     SpO2 09/26/16 1728 96 %     Weight 09/26/16 1719 182 lb (82.6 kg)     Height 09/26/16 1719  (1.473 m)     Head Circumference --      Peak Flow --      Pain Score 09/26/16 1719 0     Pain Loc --      Pain Edu? --      Excl. in GC? --     Vital signs reviewed, nursing assessments reviewed.   Constitutional:   Alert and oriented. Well appearing and in no distress. Eyes:   No scleral  icterus. No conjunctival pallor. PERRL. EOMI.  No nystagmus. ENT   Head:   Normocephalic and atraumatic.   Nose:   No congestion/rhinnorhea. No septal hematoma   Mouth/Throat:   MMM, no pharyngeal erythema. No peritonsillar mass.    Neck:   No stridor. No SubQ emphysema. No meningismus. Hematological/Lymphatic/Immunilogical:   No cervical lymphadenopathy. Cardiovascular:   RRR. Symmetric bilateral radial and DP pulses.  No murmurs.  Respiratory:   Normal respiratory effort without tachypnea nor retractions. Breath sounds are clear and equal bilaterally. No wheezes/rales/rhonchi. Gastrointestinal:   Soft and nontender. Non distended. There is no CVA tenderness.  No rebound, rigidity, or guarding. Genitourinary:   deferred Musculoskeletal:   Normal range of motion in all extremities. No joint effusions.  No lower extremity tenderness.  No edema. Neurologic:   Normal speech and language.  CN 2-10 normal. Motor grossly intact. Normal finger-nose-finger. Normal gait, although patient reports subjectively feeling off balance. NIH stroke scale 0 No gross focal neurologic deficits are appreciated.  Skin:    Skin is warm, dry and intact. No rash noted.  No petechiae, purpura, or bullae.  ____________________________________________    LABS (pertinent positives/negatives) (all labs  ordered are listed, but only abnormal results are displayed) Labs Reviewed  GLUCOSE, CAPILLARY - Abnormal; Notable for the following:       Result Value   Glucose-Capillary 132 (*)    All other components within normal limits  BASIC METABOLIC PANEL  CBC  URINALYSIS, COMPLETE (UACMP) WITH MICROSCOPIC  ETHANOL  CBG MONITORING, ED   ____________________________________________   EKG  Interpreted by me Sinus rhythm rate of 73, normal axis and intervals. Normal QRS ST segments and T waves.  ____________________________________________    RADIOLOGY  No results found.  ____________________________________________   PROCEDURES Procedures  ____________________________________________   INITIAL IMPRESSION / ASSESSMENT AND PLAN / ED COURSE  Pertinent labs & imaging results that were available during my care of the patient were reviewed by me and considered in my medical decision making (see chart for details).   Clinical Course as of Sep 26 2113  Sun Sep 26, 2016  1728 P/w lightheadedness. Poor historian, multiple subjective neurologic complaints. Will get CTA to eval for posterior circ. / aerterial lesion. NIHSS 0, would not initiate code stroke protocol. Not a candidate for tpa  [PS]    Clinical Course User Index [PS] Sharman Cheek, MD    ----------------------------------------- 9:15 PM on 09/26/2016 -----------------------------------------  Patient feeling better after drinking water and having meclizine. Vital signs remain normal. Workup is negative. We'll discharge home, follow up with primary care.Considering the patient's symptoms, medical history, and physical examination today, I have low suspicion for ischemic stroke, intracranial hemorrhage, meningitis, encephalitis, carotid or vertebral dissection, venous sinus thrombosis, MS, intracranial hypertension, glaucoma, CRAO, CRVO, or temporal  arteritis.    ____________________________________________   FINAL CLINICAL IMPRESSION(S) / ED DIAGNOSES  Final diagnoses:  Dizziness  Balance problem      New Prescriptions   No medications on file     Portions of this note were generated with dragon dictation software. Dictation errors may occur despite best attempts at proofreading.    Sharman Cheek, MD 09/26/16 2115

## 2016-10-20 ENCOUNTER — Encounter (INDEPENDENT_AMBULATORY_CARE_PROVIDER_SITE_OTHER): Payer: Medicare Other

## 2016-10-20 ENCOUNTER — Ambulatory Visit (INDEPENDENT_AMBULATORY_CARE_PROVIDER_SITE_OTHER): Payer: Medicare Other | Admitting: Vascular Surgery

## 2016-12-09 ENCOUNTER — Ambulatory Visit
Admission: RE | Admit: 2016-12-09 | Discharge: 2016-12-09 | Disposition: A | Discharge status: Home / Self Care | Payer: Medicare Other | Source: Ambulatory Visit | Attending: Gastroenterology | Admitting: Gastroenterology

## 2016-12-09 ENCOUNTER — Ambulatory Visit: Payer: Medicare Other | Admitting: Anesthesiology

## 2016-12-09 ENCOUNTER — Encounter
Admission: RE | Disposition: A | Discharge status: Home / Self Care | Payer: Self-pay | Source: Ambulatory Visit | Attending: Gastroenterology

## 2016-12-09 ENCOUNTER — Encounter: Payer: Self-pay | Admitting: Anesthesiology

## 2016-12-09 DIAGNOSIS — E1151 Type 2 diabetes mellitus with diabetic peripheral angiopathy without gangrene: Secondary | ICD-10-CM | POA: Diagnosis not present

## 2016-12-09 DIAGNOSIS — Z8673 Personal history of transient ischemic attack (TIA), and cerebral infarction without residual deficits: Secondary | ICD-10-CM | POA: Insufficient documentation

## 2016-12-09 DIAGNOSIS — I110 Hypertensive heart disease with heart failure: Secondary | ICD-10-CM | POA: Diagnosis not present

## 2016-12-09 DIAGNOSIS — I2510 Atherosclerotic heart disease of native coronary artery without angina pectoris: Secondary | ICD-10-CM | POA: Insufficient documentation

## 2016-12-09 DIAGNOSIS — F988 Other specified behavioral and emotional disorders with onset usually occurring in childhood and adolescence: Secondary | ICD-10-CM | POA: Diagnosis not present

## 2016-12-09 DIAGNOSIS — Z9981 Dependence on supplemental oxygen: Secondary | ICD-10-CM | POA: Diagnosis not present

## 2016-12-09 DIAGNOSIS — Z86711 Personal history of pulmonary embolism: Secondary | ICD-10-CM | POA: Insufficient documentation

## 2016-12-09 DIAGNOSIS — F410 Panic disorder [episodic paroxysmal anxiety] without agoraphobia: Secondary | ICD-10-CM | POA: Diagnosis not present

## 2016-12-09 DIAGNOSIS — Z7982 Long term (current) use of aspirin: Secondary | ICD-10-CM | POA: Insufficient documentation

## 2016-12-09 DIAGNOSIS — Z7984 Long term (current) use of oral hypoglycemic drugs: Secondary | ICD-10-CM | POA: Diagnosis not present

## 2016-12-09 DIAGNOSIS — Z87891 Personal history of nicotine dependence: Secondary | ICD-10-CM | POA: Insufficient documentation

## 2016-12-09 DIAGNOSIS — R1310 Dysphagia, unspecified: Secondary | ICD-10-CM | POA: Diagnosis present

## 2016-12-09 DIAGNOSIS — K210 Gastro-esophageal reflux disease with esophagitis: Secondary | ICD-10-CM | POA: Insufficient documentation

## 2016-12-09 DIAGNOSIS — E079 Disorder of thyroid, unspecified: Secondary | ICD-10-CM | POA: Insufficient documentation

## 2016-12-09 DIAGNOSIS — K224 Dyskinesia of esophagus: Secondary | ICD-10-CM | POA: Insufficient documentation

## 2016-12-09 DIAGNOSIS — E669 Obesity, unspecified: Secondary | ICD-10-CM | POA: Diagnosis not present

## 2016-12-09 DIAGNOSIS — Z79899 Other long term (current) drug therapy: Secondary | ICD-10-CM | POA: Diagnosis not present

## 2016-12-09 DIAGNOSIS — I509 Heart failure, unspecified: Secondary | ICD-10-CM | POA: Diagnosis not present

## 2016-12-09 DIAGNOSIS — K2950 Unspecified chronic gastritis without bleeding: Secondary | ICD-10-CM | POA: Insufficient documentation

## 2016-12-09 DIAGNOSIS — Z6838 Body mass index (BMI) 38.0-38.9, adult: Secondary | ICD-10-CM | POA: Diagnosis not present

## 2016-12-09 DIAGNOSIS — B3781 Candidal esophagitis: Secondary | ICD-10-CM | POA: Diagnosis not present

## 2016-12-09 DIAGNOSIS — J449 Chronic obstructive pulmonary disease, unspecified: Secondary | ICD-10-CM | POA: Insufficient documentation

## 2016-12-09 DIAGNOSIS — F329 Major depressive disorder, single episode, unspecified: Secondary | ICD-10-CM | POA: Insufficient documentation

## 2016-12-09 DIAGNOSIS — G4730 Sleep apnea, unspecified: Secondary | ICD-10-CM | POA: Insufficient documentation

## 2016-12-09 HISTORY — DX: Gastro-esophageal reflux disease without esophagitis: K21.9

## 2016-12-09 HISTORY — DX: Depression, unspecified: F32.A

## 2016-12-09 HISTORY — DX: Other pulmonary embolism without acute cor pulmonale: I26.99

## 2016-12-09 HISTORY — DX: Dermatitis, unspecified: L30.9

## 2016-12-09 HISTORY — DX: Cerebral infarction, unspecified: I63.9

## 2016-12-09 HISTORY — DX: Unspecified osteoarthritis, unspecified site: M19.90

## 2016-12-09 HISTORY — DX: Peripheral vascular disease, unspecified: I73.9

## 2016-12-09 HISTORY — DX: Phlebitis and thrombophlebitis of unspecified site: I80.9

## 2016-12-09 HISTORY — DX: Disorder of thyroid, unspecified: E07.9

## 2016-12-09 HISTORY — DX: Other specified behavioral and emotional disorders with onset usually occurring in childhood and adolescence: F98.8

## 2016-12-09 HISTORY — DX: Obesity, unspecified: E66.9

## 2016-12-09 HISTORY — DX: Atherosclerotic heart disease of native coronary artery without angina pectoris: I25.10

## 2016-12-09 HISTORY — DX: Major depressive disorder, single episode, unspecified: F32.9

## 2016-12-09 HISTORY — DX: Sleep apnea, unspecified: G47.30

## 2016-12-09 HISTORY — DX: Hyperlipidemia, unspecified: E78.5

## 2016-12-09 HISTORY — DX: Hematuria, unspecified: R31.9

## 2016-12-09 HISTORY — DX: Panic disorder (episodic paroxysmal anxiety): F41.0

## 2016-12-09 HISTORY — PX: ESOPHAGOGASTRODUODENOSCOPY (EGD) WITH PROPOFOL: SHX5813

## 2016-12-09 LAB — GLUCOSE, CAPILLARY: GLUCOSE-CAPILLARY: 130 mg/dL — AB (ref 65–99)

## 2016-12-09 SURGERY — ESOPHAGOGASTRODUODENOSCOPY (EGD) WITH PROPOFOL
Anesthesia: General

## 2016-12-09 MED ORDER — SODIUM CHLORIDE 0.9 % IV SOLN: INTRAVENOUS | Status: DC

## 2016-12-09 MED ORDER — PROPOFOL 500 MG/50ML IV EMUL
INTRAVENOUS | Status: DC | PRN
  Administered 2016-12-09: 100 ug/kg/min via INTRAVENOUS

## 2016-12-09 MED ORDER — PROPOFOL 500 MG/50ML IV EMUL
INTRAVENOUS | Status: AC
  Filled 2016-12-09: qty 50

## 2016-12-09 MED ORDER — GLYCOPYRROLATE 0.2 MG/ML IJ SOLN
INTRAMUSCULAR | Status: AC
  Filled 2016-12-09: qty 1

## 2016-12-09 MED ORDER — MIDAZOLAM HCL 2 MG/2ML IJ SOLN
INTRAMUSCULAR | Status: DC | PRN
  Administered 2016-12-09: 2 mg via INTRAVENOUS

## 2016-12-09 MED ORDER — LIDOCAINE HCL (PF) 2 % IJ SOLN
INTRAMUSCULAR | Status: AC
  Filled 2016-12-09: qty 2

## 2016-12-09 MED ORDER — EPHEDRINE SULFATE 50 MG/ML IJ SOLN
INTRAMUSCULAR | Status: AC
  Filled 2016-12-09: qty 1

## 2016-12-09 MED ORDER — FENTANYL CITRATE (PF) 100 MCG/2ML IJ SOLN
INTRAMUSCULAR | Status: DC | PRN
  Administered 2016-12-09: 50 ug via INTRAVENOUS

## 2016-12-09 MED ORDER — GLYCOPYRROLATE 0.2 MG/ML IJ SOLN
INTRAMUSCULAR | Status: DC | PRN
  Administered 2016-12-09: 0.1 mg via INTRAVENOUS

## 2016-12-09 MED ORDER — FENTANYL CITRATE (PF) 100 MCG/2ML IJ SOLN
INTRAMUSCULAR | Status: AC
  Filled 2016-12-09: qty 2

## 2016-12-09 MED ORDER — EPHEDRINE SULFATE 50 MG/ML IJ SOLN
INTRAMUSCULAR | Status: DC | PRN
  Administered 2016-12-09: 5 mg via INTRAVENOUS

## 2016-12-09 MED ORDER — LIDOCAINE HCL (CARDIAC) 20 MG/ML IV SOLN
INTRAVENOUS | Status: DC | PRN
Start: 2016-12-09 — End: 2016-12-09
  Administered 2016-12-09: 30 mg via INTRAVENOUS

## 2016-12-09 MED ORDER — SODIUM CHLORIDE 0.9 % IV SOLN
INTRAVENOUS | Status: DC
  Administered 2016-12-09: 10:00:00 via INTRAVENOUS

## 2016-12-09 MED ORDER — MIDAZOLAM HCL 2 MG/2ML IJ SOLN
INTRAMUSCULAR | Status: AC
  Filled 2016-12-09: qty 2

## 2016-12-09 NOTE — Op Note (Signed)
[facility] Patient Name: Kristen Watts Procedure Date: 12/09/2016 10:38 AM MRN: [id number] Account #: [id number] Date of Birth: 1940-10-19 Admit Type: Outpatient Age: 76 Room: [facility] ENDO ROOM 1 Gender: Female Note Status: Finalized Procedure:            Upper GI endoscopy Indications:          Dysphagia Providers:            Christena Deem, MD Referring MD:         Barbette Reichmann, MD (Referring MD) Medicines:            Monitored Anesthesia Care Complications:        No immediate complications. Procedure:            Pre-Anesthesia Assessment:                       - ASA Grade Assessment: III - A patient with severe                        systemic disease.                       After obtaining informed consent, the endoscope was                        passed under direct vision. Throughout the procedure,                        the patient's blood pressure, pulse, and oxygen                        saturations were monitored continuously. The Endoscope                        was introduced through the mouth, and advanced to the                        fourth part of duodenum. The upper GI endoscopy was                        accomplished without difficulty. The patient tolerated                        the procedure well. Findings:      The Z-line was irregular. Biopsies were taken with a cold forceps for       histology.      There is a sharp angulation in the distal esophagus before entry into       the gastric vault, however no evidence of fixed stricture throughout.      Localized mildly erythematous mucosa without bleeding was found in the       distal cardia, in the gastric body, on the greater curvature of the       gastric body and on the posterior wall of the gastric body. Biopsies       were taken with a cold forceps for histology.      The exam of the stomach was otherwise normal.      The cardia and gastric fundus were normal on  retroflexion.      The in the duodenum was normal.      Abnormal motility was  noted in the mid esophagus. The cricopharyngeus       was normal. There is a decrease in motility of the esophageal body.       Tertiary peristaltic waves are noted.      Patchy candidiasis was found in the middle third of the esophagus and in       the lower third of the esophagus. Impression:           - Z-line irregular. Biopsied.                       - Erythematous mucosa in the cardia, gastric body,                        greater curvature of the gastric body and posterior                        wall of the gastric body. Biopsied.                       - Normal.                       - Abnormal esophageal motility, suspicious for                        presbyesophagus. Recommendation:       - Await pathology results.                       - Telephone GI clinic for pathology results in 1 week.                       - Mycelex (clotrimazole) 10 mg lozenge 5x/day for 1                        week. Procedure Code(s):    --- Professional ---                       [phone], Esophagogastroduodenoscopy, flexible, transoral;                        with biopsy, single or multiple Diagnosis Code(s):    --- Professional ---                       K22.8, Other specified diseases of esophagus                       K31.89, Other diseases of stomach and duodenum                       K22.4, Dyskinesia of esophagus                       R13.10, Dysphagia, unspecified CPT copyright 2016 American Medical Association. All rights reserved. The codes documented in this report are preliminary and upon coder review may  be revised to meet current compliance requirements. Christena DeemMartin U Lakeith Careaga, MD 12/09/2016 11:11:18 AM This report has been signed electronically. Number of Addenda: 0 Note Initiated On: 12/09/2016 10:38 AM      [facility]

## 2016-12-09 NOTE — Anesthesia Post-op Follow-up Note (Cosign Needed)
Anesthesia QCDR form completed.        

## 2016-12-09 NOTE — Transfer of Care (Signed)
Immediate Anesthesia Transfer of Care Note  Patient: Kristen Watts  Procedure(s) Performed: Procedure(s): ESOPHAGOGASTRODUODENOSCOPY (EGD) WITH PROPOFOL (N/A)  Patient Location: PACU  Anesthesia Type:General  Level of Consciousness: awake and sedated  Airway & Oxygen Therapy: Patient Spontanous Breathing and Patient connected to nasal cannula oxygen  Post-op Assessment: Report given to RN and Post -op Vital signs reviewed and stable  Post vital signs: Reviewed and stable  Last Vitals:  Vitals:   12/09/16 0912  BP: 128/63  Pulse: 90  Resp: 18  Temp: (!) 36.1 C    Last Pain:  Vitals:   12/09/16 0912  TempSrc: Tympanic         Complications: No apparent anesthesia complications

## 2016-12-09 NOTE — Anesthesia Postprocedure Evaluation (Signed)
Anesthesia Post Note  Patient: Kristen Watts  Procedure(s) Performed: Procedure(s) (LRB): ESOPHAGOGASTRODUODENOSCOPY (EGD) WITH PROPOFOL (N/A)  Patient location during evaluation: PACU Anesthesia Type: General Level of consciousness: awake and alert and oriented Pain management: pain level controlled Vital Signs Assessment: post-procedure vital signs reviewed and stable Respiratory status: spontaneous breathing, nonlabored ventilation and respiratory function stable Cardiovascular status: blood pressure returned to baseline and stable Postop Assessment: no signs of nausea or vomiting Anesthetic complications: no     Last Vitals:  Vitals:   12/09/16 0912 12/09/16 1110  BP: 128/63 (!) 88/43  Pulse: 90 85  Resp: 18 (!) 23  Temp: (!) 36.1 C     Last Pain:  Vitals:   12/09/16 0912  TempSrc: Tympanic                 Dajohn Ellender

## 2016-12-09 NOTE — Anesthesia Preprocedure Evaluation (Signed)
Anesthesia Evaluation  Patient identified by MRN, date of birth, ID band Patient awake    Reviewed: Allergy & Precautions, NPO status , Patient's Chart, lab work & pertinent test results  History of Anesthesia Complications Negative for: history of anesthetic complications  Airway Mallampati: III  TM Distance: >3 FB Neck ROM: Full    Dental  (+) Upper Dentures, Lower Dentures   Pulmonary asthma , sleep apnea , COPD,  oxygen dependent, former smoker,    breath sounds clear to auscultation- rhonchi (-) wheezing      Cardiovascular hypertension, Pt. on medications + CAD, + Peripheral Vascular Disease and +CHF   Rhythm:Regular Rate:Normal - Systolic murmurs and - Diastolic murmurs    Neuro/Psych PSYCHIATRIC DISORDERS Anxiety Depression CVA, No Residual Symptoms    GI/Hepatic Neg liver ROS, GERD  ,  Endo/Other  diabetes, Oral Hypoglycemic Agents  Renal/GU negative Renal ROS     Musculoskeletal  (+) Arthritis ,   Abdominal (+) + obese,   Peds  Hematology negative hematology ROS (+)   Anesthesia Other Findings Past Medical History: No date: ADD (attention deficit disorder) No date: Arthritis No date: ASCVD (arteriosclerotic cardiovascular disease) No date: Asthma No date: Blood transfusion without reported diagnosis No date: CHF (congestive heart failure) (HCC) No date: COPD (chronic obstructive pulmonary disease) (HCC) No date: Coronary artery disease No date: Depression No date: Diabetes mellitus without complication (HCC) No date: Eczema No date: GERD (gastroesophageal reflux disease) No date: Hematuria No date: Hyperlipidemia No date: Hypertension No date: Obesity No date: Osteoarthritis No date: Panic attacks No date: Phlebitis No date: Pulmonary embolism (HCC) No date: PVD (peripheral vascular disease) (HCC) No date: Sleep apnea No date: Stroke [facility](HCC) No date: Thyroid disease   Reproductive/Obstetrics                             Anesthesia Physical Anesthesia Plan  ASA: III  Anesthesia Plan: General   Post-op Pain Management:    Induction: Intravenous  PONV Risk Score and Plan: 2 and Propofol  Airway Management Planned: Natural Airway  Additional Equipment:   Intra-op Plan:   Post-operative Plan:   Informed Consent: I have reviewed the patients History and Physical, chart, labs and discussed the procedure including the risks, benefits and alternatives for the proposed anesthesia with the patient or authorized representative who has indicated his/her understanding and acceptance.   Dental advisory given  Plan Discussed with: CRNA and Anesthesiologist  Anesthesia Plan Comments:         Anesthesia Quick Evaluation

## 2016-12-09 NOTE — Anesthesia Procedure Notes (Signed)
Performed by: COOK-MARTIN, Lakesia Dahle Pre-anesthesia Checklist: Patient identified, Emergency Drugs available, Suction available, Timeout performed and Patient being monitored Patient Re-evaluated:Patient Re-evaluated prior to induction Oxygen Delivery Method: Nasal cannula Preoxygenation: Pre-oxygenation with 100% oxygen Induction Type: IV induction Airway Equipment and Method: Bite block Placement Confirmation: CO2 detector and positive ETCO2       

## 2016-12-09 NOTE — H&P (Signed)
Outpatient short stay form Pre-procedure 12/09/2016 10:38 AM Kristen DeemMartin U Skulskie MD  Primary Physician: Dr Barbette ReichmannVishwanath Hande  Reason for visit:  EGD  History of present illness:  Patient is a 76 year old female presenting today as above. She has a history of complaint of some dysphagia. This is basically low cervical in nature. She did have a barium swallow done. 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 On the barium swallow it is noted that the upper lobe of the right lung pressures or pushes extrinsically on both the trachea as well as missed aligning the esophagus. Also there is a prominent aortic arch with atherosclerosis that compresses that area as well. 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 On the reading of the procedure was noted a widely patent cervical and thoracic esophagus and a barium tablet did not hang. Patient does take excellent daily however is taking it before bedtime. We discussed the need to move that to about half an hour before meal. She takes no blood thinning agents with the exception of 81 mg aspirin that has been held. It is of note that she does not regurgitate foods.   Current Facility-Administered Medications:  .  0.9 %  sodium chloride infusion, , Intravenous, Continuous, Kristen DeemSkulskie, Martin U, MD, Last Rate: 20 mL/hr at 12/09/16 0944 .  0.9 %  sodium chloride infusion, , Intravenous, Continuous, Kristen DeemSkulskie, Martin U, MD .  0.9 %  sodium chloride infusion, , Intravenous, Continuous, Kristen DeemSkulskie, Martin U, MD  Facility-Administered Medications Prior to Admission  Medication Dose Route Frequency Provider Last Rate Last Dose  . betamethasone acetate-betamethasone sodium phosphate (CELESTONE) injection 3 mg  3 mg Intramuscular Once Felecia ShellingEvans, Brent M, DPM       Prescriptions Prior to Admission  Medication Sig Dispense Refill Last Dose  . ADVAIR DISKUS 250-50 MCG/DOSE AEPB INHALE 1 PUFF INTO  THE LUNGS EVERY 12 HOURS  5 12/09/2016 at Unknown time  . albuterol (PROVENTIL HFA;VENTOLIN HFA) 108 (90 Base) MCG/ACT inhaler Inhale 1-2 puffs into the lungs every 4 (four) hours as needed for wheezing or shortness of breath.   12/09/2016 at Unknown time  . albuterol (PROVENTIL) (2.5 MG/3ML) 0.083% nebulizer solution Take 2.5 mg by nebulization every 4 (four) hours as needed for wheezing or shortness of breath.   12/09/2016 at Unknown time  . ALPRAZolam (XANAX) 0.25 MG tablet Take 0.25 mg by mouth at bedtime as needed. for sleep  3 Past Week at Unknown time  . aspirin EC 81 MG tablet Take 81 mg by mouth daily.   Past Week at Unknown time  . atomoxetine (STRATTERA) 40 MG capsule Take 40 mg by mouth daily.   Past Week at Unknown time  . carvedilol (COREG) 6.25 MG tablet Take 6.25 mg by mouth 2 (two) times daily with a meal.   12/08/2016 at Unknown time  . Cholecalciferol 10000 units TABS Take by mouth daily.   Past Week at Unknown time  . cyanocobalamin 1000 MCG tablet Take by mouth daily.     Marland Kitchen. DEXILANT 60 MG capsule Take 1 capsule by mouth daily.  6 12/08/2016 at Unknown time  . furosemide (LASIX) 40 MG tablet TAKE ONE TABLET BY MOUTH EVERY DAY AS NEEDED FOR EDEMA  6 12/08/2016 at Unknown time  . gabapentin (NEURONTIN) 100 MG capsule Take 100 mg by mouth at bedtime.   Past Week at Unknown time  . ibuprofen (ADVIL,MOTRIN) 600  MG tablet Take 1 tablet (600 mg total) by mouth every 8 (eight) hours as needed. (Patient taking differently: Take 600 mg by mouth every 6 (six) hours as needed. ) 30 tablet 0 Past Week at Unknown time  . Iodoquinol-HC-Aloe Polysacch (ALCORTIN A) 1-2-1 % GEL Apply topically.   Past Week at Unknown time  . iron polysaccharides (NIFEREX) 150 MG capsule Take 150 mg by mouth daily.   Past Week at Unknown time  . isosorbide mononitrate (IMDUR) 60 MG 24 hr tablet Take 60 mg by mouth daily.  5 12/08/2016 at Unknown time  . metFORMIN (GLUCOPHAGE) 500 MG tablet TAKE ONE TABLET BY MOUTH EVERY  MORNING WITH BREAKFAST  6 Past Week at Unknown time  . nitroGLYCERIN (NITROSTAT) 0.4 MG SL tablet Place 0.4 mg under the tongue every 5 (five) minutes as needed for chest pain.   Past Month at Unknown time  . omega-3 acid ethyl esters (LOVAZA) 1 g capsule Take 1 capsule by mouth 2 (two) times daily.  6 Past Week at Unknown time  . Potassium 99 MG TABS Take 1 tablet by mouth daily.   Past Week at Unknown time  . SYNTHROID 150 MCG tablet Take 150 mcg by mouth every morning.  3 Past Week at Unknown time  . thiamine 100 MG tablet Take 100 mg by mouth daily.   Past Week at Unknown time  . valsartan (DIOVAN) 80 MG tablet Take 1 tablet by mouth once daily.  3 12/08/2016 at Unknown time  . vitamin E 1000 UNIT capsule Take 1,000 Units by mouth daily.   Past Week at Unknown time  . zinc gluconate 50 MG tablet Take 50 mg by mouth daily.   Past Week at Unknown time  . buPROPion (WELLBUTRIN SR) 150 MG 12 hr tablet Take 150 mg by mouth 2 (two) times daily.   Not Taking at Unknown time  . citalopram (CELEXA) 10 MG tablet Take 1 tablet (10 mg total) by mouth daily. 30 tablet 2 09/26/2016 at am  . clotrimazole-betamethasone (LOTRISONE) cream Apply topically 2 (two) times daily.  2 prn at prn  . diphenhydrAMINE (BENADRYL) 25 MG tablet Take 100 mg by mouth 2 (two) times daily.   09/26/2016 at am  . ipratropium-albuterol (DUONEB) 0.5-2.5 (3) MG/3ML SOLN Take 3 mLs by nebulization every 4 (four) hours as needed.   prn at prn  . nystatin cream (MYCOSTATIN) Apply 1 application topically 2 (two) times daily. 60 g 1 prn at prn  . predniSONE (DELTASONE) 20 MG tablet Take 3 tablets (60 mg total) by mouth daily. (Patient not taking: Reported on 09/26/2016) 15 tablet 0 Not Taking at Unknown time  . risperiDONE (RISPERDAL) 1 MG tablet Take 1 tablet (1 mg total) by mouth at bedtime. (Patient taking differently: Take 0.5 mg by mouth at bedtime. ) 30 tablet 1 09/25/2016 at qhs     Allergies  Allergen Reactions  .  Clindamycin/Lincomycin   . Iodine Swelling  . Stadol [Butorphanol] Other (See Comments)    LOC  . Darvon [Propoxyphene] Rash  . Demerol [Meperidine] Palpitations  . Latex Rash     Past Medical History:  Diagnosis Date  . ADD (attention deficit disorder)   . Arthritis   . ASCVD (arteriosclerotic cardiovascular disease)   . Asthma   . Blood transfusion without reported diagnosis   . CHF (congestive heart failure) (HCC)   . COPD (chronic obstructive pulmonary disease) (HCC)   . Coronary artery disease   . Depression   .  Diabetes mellitus without complication (HCC)   . Eczema   . GERD (gastroesophageal reflux disease)   . Hematuria   . Hyperlipidemia   . Hypertension   . Obesity   . Osteoarthritis   . Panic attacks   . Phlebitis   . Pulmonary embolism (HCC)   . PVD (peripheral vascular disease) (HCC)   . Sleep apnea   . Stroke (HCC)   . Thyroid disease     Review of systems:      Physical Exam    Heart and lungs: Regular rate and rhythm without rub or gallop, lungs are bilaterally clear.    HEENT: Normocephalic atraumatic eyes are anicteric    Other:     Pertinant exam for procedure: Soft nontender nondistended bowel sounds positive normoactive.    Planned proceedures: EGD and indicated procedures. I have discussed the risks benefits and complications of procedures to include not limited to bleeding, infection, perforation and the risk of sedation and the patient wishes to proceed.    Kristen Deem, MD Gastroenterology 12/09/2016  10:38 AM

## 2016-12-10 ENCOUNTER — Encounter: Payer: Self-pay | Admitting: Gastroenterology

## 2016-12-10 LAB — SURGICAL PATHOLOGY

## 2016-12-17 ENCOUNTER — Encounter (INDEPENDENT_AMBULATORY_CARE_PROVIDER_SITE_OTHER): Payer: Medicare Other

## 2016-12-17 ENCOUNTER — Ambulatory Visit (INDEPENDENT_AMBULATORY_CARE_PROVIDER_SITE_OTHER): Payer: Medicare Other | Admitting: Vascular Surgery

## 2017-02-01 ENCOUNTER — Encounter (INDEPENDENT_AMBULATORY_CARE_PROVIDER_SITE_OTHER): Payer: Self-pay | Admitting: Vascular Surgery

## 2017-02-01 ENCOUNTER — Ambulatory Visit (INDEPENDENT_AMBULATORY_CARE_PROVIDER_SITE_OTHER): Payer: Medicare Other | Admitting: Vascular Surgery

## 2017-02-01 ENCOUNTER — Ambulatory Visit (INDEPENDENT_AMBULATORY_CARE_PROVIDER_SITE_OTHER): Payer: Medicare Other

## 2017-02-01 VITALS — BP 92/54 | HR 90 | Resp 16 | Wt 181.6 lb

## 2017-02-01 DIAGNOSIS — M7989 Other specified soft tissue disorders: Secondary | ICD-10-CM

## 2017-02-01 DIAGNOSIS — I10 Essential (primary) hypertension: Secondary | ICD-10-CM | POA: Diagnosis not present

## 2017-02-01 DIAGNOSIS — I83813 Varicose veins of bilateral lower extremities with pain: Secondary | ICD-10-CM | POA: Insufficient documentation

## 2017-02-01 NOTE — Patient Instructions (Signed)
Varicose Vein Surgery, Care After Refer to this sheet in the next few weeks. These instructions provide you with information about caring for yourself after your procedure. Your health care provider may also give you more specific instructions. Your treatment has been planned according to current medical practices, but problems sometimes occur. Call your health care provider if you have any problems or questions after your procedure. What can I expect after the procedure? After your procedure, it is typical to have the following:  Swelling.  Bruising.  Soreness.  Mild skin discoloration.  Slight bleeding at incision sites.  Follow these instructions at home:  Take medicines only as directed by your health care provider.  Wear compression stockings as directed by your health care provider. These stockings help to prevent blood clots and reduce swelling in your legs.  There are many different ways to close and cover an incision, including stitches (sutures), skin glue, and adhesive strips. Follow your health care provider's instructions on: ? Incision care. ? Bandage (dressing) changes and removal. ? Incision closure removal.  Wear loose-fitting clothing.  Get regular daily exercise. Walk or ride a stationary bike daily or as directed by your health care provider.  Ask your health care provider when you can return to work. This may depend on the type of work you do.  Be patient with your recovery. It can take up to 4 weeks to get back to your usual activities. Contact a health care provider if:  You have a fever.  You have drainage, redness, swelling, or pain at an incision site.  You develop a cough. Get help right away if:  You pass out.  You have very bad pain in your leg.  You have leg pain that gets worse when you walk.  You have redness or swelling in your leg that is getting worse.  You have trouble breathing.  You cough up blood. This information is not  intended to replace advice given to you by your health care provider. Make sure you discuss any questions you have with your health care provider. Document Released: 01/18/2014 Document Revised: 10/23/2015 Document Reviewed: 10/24/2013 Elsevier Interactive Patient Education  2018 [employer] Inc[employer]  

## 2017-02-01 NOTE — Progress Notes (Signed)
Patient ID: Kristen Watts, female   DOB: 11/01/1940, 76 y.o.   MRN: [id number]  Chief Complaint  Patient presents with  . Follow-up    pt conv bil le ven reflux    HPI Kristen Watts is a 76 y.o. female.  Patient returns in follow up of their venous disease.  They have done their best to comply with the prescribed conservative therapies of compression stockings, leg elevation, exercise, and still requires anti-inflammatories for discomfort and has symptoms that are persistent and bothersome on a daily basis, affecting their activities of daily living and normal activities.  The venous reflux study demonstrates Bilateral great saphenous vein reflux as well as deep venous reflux. No DVT or superficial thrombophlebitis was identified.         Past Medical History:  Diagnosis Date  . Arthritis   . Asthma   . Blood transfusion without reported diagnosis   . CHF (congestive heart failure) (HCC)   . COPD (chronic obstructive pulmonary disease) (HCC)   . Coronary artery disease   . Diabetes mellitus without complication (HCC)   . Hypertension          Past Surgical History:  Procedure Laterality Date  . ABDOMINAL HYSTERECTOMY    . BACK SURGERY    . BREAST BIOPSY Right    neg  . BREAST SURGERY    . CARDIAC SURGERY    . CHOLECYSTECTOMY    . FRACTURE SURGERY    . TONSILLECTOMY           Family History  Problem Relation Age of Onset  . Breast cancer Neg Hx   No history of bleeding disorders, clotting disorders, autoimmune diseases, or aneurysms  Social History     Social History  Substance Use Topics  . Smoking status: Never Smoker  . Smokeless tobacco: Never Used  . Alcohol use No  No IV drug use       Allergies  Allergen Reactions  . Stadol [Butorphanol] Other (See Comments)    LOC  . Darvon [Propoxyphene] Rash  . Demerol [Meperidine] Palpitations  . Latex Rash          Current Outpatient Prescriptions  Medication Sig  Dispense Refill  . ADVAIR DISKUS 250-50 MCG/DOSE AEPB INHALE 1 PUFF INTO THE LUNGS EVERY 12 HOURS  5  . albuterol (PROVENTIL HFA;VENTOLIN HFA) 108 (90 Base) MCG/ACT inhaler Inhale 1-2 puffs into the lungs every 4 (four) hours as needed for wheezing or shortness of breath.    Marland Kitchen. albuterol (PROVENTIL) (2.5 MG/3ML) 0.083% nebulizer solution Take 2.5 mg by nebulization every 4 (four) hours as needed for wheezing or shortness of breath.    . ALPRAZolam (XANAX) 0.25 MG tablet Take 0.25 mg by mouth at bedtime as needed. for sleep  3  . amoxicillin-clavulanate (AUGMENTIN) 875-125 MG tablet Take 1 tablet by mouth every 12 (twelve) hours. 14 tablet 0  . carvedilol (COREG) 6.25 MG tablet Take 6.25 mg by mouth 2 (two) times daily with a meal.    . celecoxib (CELEBREX) 200 MG capsule Take 200 mg by mouth daily.  5  . clotrimazole-betamethasone (LOTRISONE) cream Apply topically 2 (two) times daily.  2  . DEXILANT 60 MG capsule Take 1 capsule by mouth daily.  6  . furosemide (LASIX) 40 MG tablet TAKE ONE TABLET BY MOUTH EVERY DAY AS NEEDED FOR EDEMA  6  . ibuprofen (ADVIL,MOTRIN) 600 MG tablet Take 1 tablet (600 mg total) by mouth every 8 (eight) hours as  needed. 30 tablet 0  . isosorbide mononitrate (IMDUR) 60 MG 24 hr tablet Take 60 mg by mouth daily.  5  . metFORMIN (GLUCOPHAGE) 500 MG tablet TAKE ONE TABLET BY MOUTH EVERY MORNING WITH BREAKFAST  6  . nitroGLYCERIN (NITROSTAT) 0.4 MG SL tablet Place 0.4 mg under the tongue every 5 (five) minutes as needed for chest pain.    Marland Kitchen nystatin cream (MYCOSTATIN) Apply 1 application topically 2 (two) times daily. 60 g 1  . omega-3 acid ethyl esters (LOVAZA) 1 g capsule Take 1 capsule by mouth 2 (two) times daily.  6  . predniSONE (DELTASONE) 20 MG tablet Take 3 tablets (60 mg total) by mouth daily. 15 tablet 0  . SYNTHROID 150 MCG tablet Take 150 mcg by mouth every morning.  3  . valsartan (DIOVAN) 160 MG tablet Take 1 tablet (160 mg total) by mouth  once daily.  3  . citalopram (CELEXA) 10 MG tablet Take 1 tablet (10 mg total) by mouth daily. 30 tablet 2  . risperiDONE (RISPERDAL) 1 MG tablet Take 1 tablet (1 mg total) by mouth at bedtime. 30 tablet 1            Current Facility-Administered Medications  Medication Dose Route Frequency Provider Last Rate Last Dose  . betamethasone acetate-betamethasone sodium phosphate (CELESTONE) injection 3 mg  3 mg Intramuscular Once Felecia Shelling, DPM          REVIEW OF SYSTEMS (Negative unless checked)  Constitutional: [] Weight loss  [] Fever  [] Chills Cardiac: [] Chest pain   [] Chest pressure   [] Palpitations   [] Shortness of breath when laying flat   [] Shortness of breath at rest   [] Shortness of breath with exertion. Vascular:  [] Pain in legs with walking   [x] Pain in legs at rest   [] Pain in legs when laying flat   [] Claudication   [] Pain in feet when walking  [] Pain in feet at rest  [] Pain in feet when laying flat   [] History of DVT   [] Phlebitis   [x] Swelling in legs   [x] Varicose veins   [] Non-healing ulcers Pulmonary:   [] Uses home oxygen   [] Productive cough   [] Hemoptysis   [] Wheeze  [] COPD   [] Asthma Neurologic:  [] Dizziness  [] Blackouts   [] Seizures   [] History of stroke   [] History of TIA  [] Aphasia   [] Temporary blindness   [] Dysphagia   [] Weakness or numbness in arms   [] Weakness or numbness in legs Musculoskeletal:  [] Arthritis   [] Joint swelling   [] Joint pain   [] Low back pain Hematologic:  [] Easy bruising  [] Easy bleeding   [] Hypercoagulable state   [] Anemic  [] Hepatitis Gastrointestinal:  [] Blood in stool   [] Vomiting blood  [] Gastroesophageal reflux/heartburn   [] Abdominal pain Genitourinary:  [] Chronic kidney disease   [] Difficult urination  [] Frequent urination  [] Burning with urination   [] Hematuria Skin:  [x] Rashes   [] Ulcers   [] Wounds Psychological:  [] History of anxiety   []  History of major depression.      Physical Exam BP (!) 92/54   Pulse 90   Resp 16    Wt 82.4 kg (181 lb 9.6 oz)   BMI 37.95 kg/m  Gen:  WD/WN, NAD Head: [place]/AT, No temporalis wasting.  Ear/Nose/Throat: Hearing grossly intact, dentition poor Eyes: Sclera non-icteric. Conjunctiva clear Neck: Supple. Trachea midline Pulmonary:  Good air movement, no use of accessory muscles, respirations not labored.  Cardiac: RRR, No JVD Vascular: Varicosities diffuse and measuring up to 2-3 mm in the right lower extremity  Varicosities extensive and measuring up to 3-4 mm in the left lower extremity Vessel Right Left  Radial Palpable Palpable                          PT Palpable Palpable  DP Palpable Palpable   Gastrointestinal: soft, non-tender/non-distended.  Musculoskeletal: M/S 5/5 throughout.   1 + RLE edema.  trace LLE edema Neurologic: Sensation grossly intact in extremities.  Symmetrical.  Speech is fluent.  Psychiatric: Judgment intact, Mood & affect appropriate for pt's clinical situation. Dermatologic: No rashes or ulcers noted.  No cellulitis or open wounds.  Radiology No results found.  Labs Recent Results (from the past 2160 hour(s))  Glucose, capillary     Status: Abnormal   Collection Time: 12/09/16  9:40 AM  Result Value Ref Range   Glucose-Capillary 130 (H) 65 - 99 mg/dL  Surgical pathology     Status: None   Collection Time: 12/09/16 10:53 AM  Result Value Ref Range   SURGICAL PATHOLOGY      Surgical Pathology CASE: [id number] PATIENT: Tollie Pizza Watts Surgical Pathology Report     SPECIMEN SUBMITTED: A. Stomach, body, atypical mucosa; cbx B. GEJ; cbx  CLINICAL HISTORY: None provided  PRE-OPERATIVE DIAGNOSIS: GERD, dysphagia  POST-OPERATIVE DIAGNOSIS: Presbyesophagus, atypical mucosa gastric body, candida, irregular GEJ     DIAGNOSIS: A. STOMACH, BODY; COLD BIOPSY: - OXYNTIC MUCOSA WITH MILD CHRONIC NONSPECIFIC GASTRITIS. - NEGATIVE FOR H. PYLORI, DYSPLASIA, AND MALIGNANCY.  B. GEJ; COLD BIOPSY: - REFLUX  GASTROESOPHAGITIS. - NEGATIVE FOR DYSPLASIA AND MALIGNANCY.   GROSS DESCRIPTION: A. Labeled: C BX atypical mucosa gastric body  Tissue fragment(s): 1  Size: 0.4 cm  Description: pink to tan fragment  Entirely submitted in one cassette(s).   B. Labeled: C BX GEJ  Tissue fragment(s): 1  Size: 0.4 cm  Description: pink-tan fragment  Entirely submitted in 1 cassette(s).    Final Diagnosis performed by Elijah Birk, MD.   Electronically signed 12/10/2016 9:42:06AM    The electronic signature indicates that the named Attending Pathologist has evaluated the specimen  Technical component performed at [facility], [address], [place], [place] [address] Lab: [phone] Dir: Titus Dubin. Cato Mulligan, MD  Professional component performed at [facility], [facility], [address] [place], [place], [place] [address] Lab: [phone] Dir: Georgiann Cocker. Rubinas, MD      Assessment/Plan: Diabetes (HCC) blood glucose control important in reducing the progression of atherosclerotic disease. Also, involved in wound healing. On appropriate medications.   Essential hypertension, benign blood pressure control important in reducing the progression of atherosclerotic disease. On appropriate oral medications.   COPD (chronic obstructive pulmonary disease) (HCC) On inhaled and oral agents  Varicose veins of bilateral lower extremities with pain     The patient has done their best to comply with conservative therapy of 20-30 mm Hg compression stockings, leg elevation, exercise, and anti-inflammatories as needed for discomfort.  Despite this, they continue to have daily and persistent symptoms from their venous disease.  A venous reflux study demonstrates Bilateral great saphenous vein reflux as well as deep venous reflux. No DVT or superficial thrombophlebitis was identified.  As such, the patient is likely to benefit from endovenous laser ablation of the GSV bilaterally, but with  her deep venous reflux, continued use of compression stockings and elevation would be necessary as well.  Risks and benefits of the procedure including bleeding, infection, recanalization, DVT, and need for further therapy for residual varicosities were discussed.  The patient voices their understanding and is agreeable to proceed with bilateral staged EVLT of the GSV.     Festus Barren 02/01/2017, 1:32 PM

## 2017-04-29 ENCOUNTER — Emergency Department: Payer: Medicare Other

## 2017-04-29 ENCOUNTER — Encounter: Payer: Self-pay | Admitting: Internal Medicine

## 2017-04-29 ENCOUNTER — Inpatient Hospital Stay
Admission: EM | Admit: 2017-04-29 | Discharge: 2017-05-01 | DRG: 689 | Disposition: A | Discharge status: Home / Self Care | Payer: Medicare Other | Attending: Specialist | Admitting: Specialist

## 2017-04-29 ENCOUNTER — Other Ambulatory Visit: Payer: Self-pay

## 2017-04-29 DIAGNOSIS — E039 Hypothyroidism, unspecified: Secondary | ICD-10-CM | POA: Diagnosis present

## 2017-04-29 DIAGNOSIS — Z9981 Dependence on supplemental oxygen: Secondary | ICD-10-CM

## 2017-04-29 DIAGNOSIS — E785 Hyperlipidemia, unspecified: Secondary | ICD-10-CM | POA: Diagnosis present

## 2017-04-29 DIAGNOSIS — F410 Panic disorder [episodic paroxysmal anxiety] without agoraphobia: Secondary | ICD-10-CM | POA: Diagnosis present

## 2017-04-29 DIAGNOSIS — Z7951 Long term (current) use of inhaled steroids: Secondary | ICD-10-CM

## 2017-04-29 DIAGNOSIS — F988 Other specified behavioral and emotional disorders with onset usually occurring in childhood and adolescence: Secondary | ICD-10-CM | POA: Diagnosis present

## 2017-04-29 DIAGNOSIS — J209 Acute bronchitis, unspecified: Secondary | ICD-10-CM | POA: Diagnosis not present

## 2017-04-29 DIAGNOSIS — K219 Gastro-esophageal reflux disease without esophagitis: Secondary | ICD-10-CM | POA: Diagnosis present

## 2017-04-29 DIAGNOSIS — Z8249 Family history of ischemic heart disease and other diseases of the circulatory system: Secondary | ICD-10-CM | POA: Diagnosis not present

## 2017-04-29 DIAGNOSIS — I2510 Atherosclerotic heart disease of native coronary artery without angina pectoris: Secondary | ICD-10-CM | POA: Diagnosis present

## 2017-04-29 DIAGNOSIS — F329 Major depressive disorder, single episode, unspecified: Secondary | ICD-10-CM | POA: Diagnosis present

## 2017-04-29 DIAGNOSIS — Z7982 Long term (current) use of aspirin: Secondary | ICD-10-CM

## 2017-04-29 DIAGNOSIS — J440 Chronic obstructive pulmonary disease with acute lower respiratory infection: Secondary | ICD-10-CM | POA: Diagnosis not present

## 2017-04-29 DIAGNOSIS — E669 Obesity, unspecified: Secondary | ICD-10-CM | POA: Diagnosis present

## 2017-04-29 DIAGNOSIS — R55 Syncope and collapse: Secondary | ICD-10-CM | POA: Diagnosis present

## 2017-04-29 DIAGNOSIS — Z79899 Other long term (current) drug therapy: Secondary | ICD-10-CM

## 2017-04-29 DIAGNOSIS — Z8672 Personal history of thrombophlebitis: Secondary | ICD-10-CM

## 2017-04-29 DIAGNOSIS — Z91040 Latex allergy status: Secondary | ICD-10-CM

## 2017-04-29 DIAGNOSIS — G4733 Obstructive sleep apnea (adult) (pediatric): Secondary | ICD-10-CM | POA: Diagnosis not present

## 2017-04-29 DIAGNOSIS — I5033 Acute on chronic diastolic (congestive) heart failure: Secondary | ICD-10-CM | POA: Diagnosis not present

## 2017-04-29 DIAGNOSIS — I951 Orthostatic hypotension: Secondary | ICD-10-CM | POA: Diagnosis present

## 2017-04-29 DIAGNOSIS — Z23 Encounter for immunization: Secondary | ICD-10-CM | POA: Diagnosis not present

## 2017-04-29 DIAGNOSIS — Z9049 Acquired absence of other specified parts of digestive tract: Secondary | ICD-10-CM

## 2017-04-29 DIAGNOSIS — Z791 Long term (current) use of non-steroidal anti-inflammatories (NSAID): Secondary | ICD-10-CM

## 2017-04-29 DIAGNOSIS — Z90710 Acquired absence of both cervix and uterus: Secondary | ICD-10-CM

## 2017-04-29 DIAGNOSIS — Z87891 Personal history of nicotine dependence: Secondary | ICD-10-CM

## 2017-04-29 DIAGNOSIS — I110 Hypertensive heart disease with heart failure: Secondary | ICD-10-CM | POA: Diagnosis present

## 2017-04-29 DIAGNOSIS — E1151 Type 2 diabetes mellitus with diabetic peripheral angiopathy without gangrene: Secondary | ICD-10-CM | POA: Diagnosis not present

## 2017-04-29 DIAGNOSIS — Z881 Allergy status to other antibiotic agents status: Secondary | ICD-10-CM

## 2017-04-29 DIAGNOSIS — N390 Urinary tract infection, site not specified: Principal | ICD-10-CM

## 2017-04-29 DIAGNOSIS — N179 Acute kidney failure, unspecified: Secondary | ICD-10-CM | POA: Diagnosis present

## 2017-04-29 DIAGNOSIS — R531 Weakness: Secondary | ICD-10-CM

## 2017-04-29 DIAGNOSIS — Z888 Allergy status to other drugs, medicaments and biological substances status: Secondary | ICD-10-CM

## 2017-04-29 DIAGNOSIS — Z7984 Long term (current) use of oral hypoglycemic drugs: Secondary | ICD-10-CM

## 2017-04-29 DIAGNOSIS — Z885 Allergy status to narcotic agent status: Secondary | ICD-10-CM

## 2017-04-29 DIAGNOSIS — Z8673 Personal history of transient ischemic attack (TIA), and cerebral infarction without residual deficits: Secondary | ICD-10-CM

## 2017-04-29 DIAGNOSIS — Z86711 Personal history of pulmonary embolism: Secondary | ICD-10-CM

## 2017-04-29 DIAGNOSIS — E860 Dehydration: Secondary | ICD-10-CM | POA: Diagnosis not present

## 2017-04-29 DIAGNOSIS — E119 Type 2 diabetes mellitus without complications: Secondary | ICD-10-CM

## 2017-04-29 DIAGNOSIS — Z6836 Body mass index (BMI) 36.0-36.9, adult: Secondary | ICD-10-CM

## 2017-04-29 LAB — URINALYSIS, COMPLETE (UACMP) WITH MICROSCOPIC
Bilirubin Urine: NEGATIVE
GLUCOSE, UA: NEGATIVE mg/dL
Hgb urine dipstick: NEGATIVE
KETONES UR: NEGATIVE mg/dL
Nitrite: NEGATIVE
PROTEIN: NEGATIVE mg/dL
Specific Gravity, Urine: 1.008 (ref 1.005–1.030)
pH: 6 (ref 5.0–8.0)

## 2017-04-29 LAB — CBC WITH DIFFERENTIAL/PLATELET
Basophils Absolute: 0.1 10*3/uL (ref 0–0.1)
Basophils Relative: 1 %
Eosinophils Absolute: 0.3 10*3/uL (ref 0–0.7)
Eosinophils Relative: 3 %
HEMATOCRIT: 35.5 % (ref 35.0–47.0)
Hemoglobin: 11.4 g/dL — ABNORMAL LOW (ref 12.0–16.0)
LYMPHS ABS: 2.2 10*3/uL (ref 1.0–3.6)
LYMPHS PCT: 22 %
MCH: 25.7 pg — AB (ref 26.0–34.0)
MCHC: 32 g/dL (ref 32.0–36.0)
MCV: 80.3 fL (ref 80.0–100.0)
Monocytes Absolute: 0.9 10*3/uL (ref 0.2–0.9)
Monocytes Relative: 9 %
NEUTROS ABS: 6.6 10*3/uL — AB (ref 1.4–6.5)
Neutrophils Relative %: 65 %
Platelets: 217 10*3/uL (ref 150–440)
RBC: 4.42 MIL/uL (ref 3.80–5.20)
RDW: 15.2 % — ABNORMAL HIGH (ref 11.5–14.5)
WBC: 10.1 10*3/uL (ref 3.6–11.0)

## 2017-04-29 LAB — BLOOD GAS, VENOUS
ACID-BASE EXCESS: 2.2 mmol/L — AB (ref 0.0–2.0)
BICARBONATE: 28.7 mmol/L — AB (ref 20.0–28.0)
O2 SAT: UNDETERMINED %
PATIENT TEMPERATURE: 37
PO2 VEN: UNDETERMINED mmHg (ref 32.0–45.0)
pCO2, Ven: 52 mmHg (ref 44.0–60.0)
pH, Ven: 7.35 (ref 7.250–7.430)

## 2017-04-29 LAB — TROPONIN I

## 2017-04-29 LAB — GLUCOSE, CAPILLARY: GLUCOSE-CAPILLARY: 105 mg/dL — AB (ref 65–99)

## 2017-04-29 LAB — BASIC METABOLIC PANEL
Anion gap: 9 (ref 5–15)
BUN: 22 mg/dL — AB (ref 6–20)
CHLORIDE: 103 mmol/L (ref 101–111)
CO2: 25 mmol/L (ref 22–32)
Calcium: 8.7 mg/dL — ABNORMAL LOW (ref 8.9–10.3)
Creatinine, Ser: 1.25 mg/dL — ABNORMAL HIGH (ref 0.44–1.00)
GFR calc non Af Amer: 41 mL/min — ABNORMAL LOW (ref >60)
GFR, EST AFRICAN AMERICAN: 48 mL/min — AB (ref >60)
Glucose, Bld: 153 mg/dL — ABNORMAL HIGH (ref 65–99)
POTASSIUM: 4.2 mmol/L (ref 3.5–5.1)
SODIUM: 137 mmol/L (ref 135–145)

## 2017-04-29 MED ORDER — IPRATROPIUM-ALBUTEROL 0.5-2.5 (3) MG/3ML IN SOLN: 3.0000 mL | RESPIRATORY_TRACT | Status: DC | PRN

## 2017-04-29 MED ORDER — ALPRAZOLAM 0.5 MG PO TABS
0.2500 mg | ORAL_TABLET | Freq: Every evening | ORAL | Status: DC | PRN
Start: 2017-04-29 — End: 2017-05-01
  Administered 2017-04-29: 0.25 mg via ORAL
  Filled 2017-04-29: qty 1

## 2017-04-29 MED ORDER — LEVOTHYROXINE SODIUM 50 MCG PO TABS
150.0000 ug | ORAL_TABLET | Freq: Every day | ORAL | Status: DC
  Administered 2017-04-30 – 2017-05-01 (×2): 150 ug via ORAL
  Filled 2017-04-29 (×2): qty 1

## 2017-04-29 MED ORDER — ALBUTEROL SULFATE (2.5 MG/3ML) 0.083% IN NEBU: 2.5000 mg | INHALATION_SOLUTION | RESPIRATORY_TRACT | Status: DC | PRN

## 2017-04-29 MED ORDER — DEXTROSE 5 % IV SOLN
1.0000 g | INTRAVENOUS | Status: DC
  Administered 2017-04-30: 1 g via INTRAVENOUS
  Filled 2017-04-29 (×2): qty 10

## 2017-04-29 MED ORDER — SODIUM CHLORIDE 0.9 % IV SOLN
INTRAVENOUS | Status: DC
  Administered 2017-04-29: 21:00:00 via INTRAVENOUS

## 2017-04-29 MED ORDER — DOCUSATE SODIUM 100 MG PO CAPS: 100.0000 mg | ORAL_CAPSULE | Freq: Two times a day (BID) | ORAL | Status: DC | PRN

## 2017-04-29 MED ORDER — HEPARIN SODIUM (PORCINE) 5000 UNIT/ML IJ SOLN
5000.0000 [IU] | Freq: Three times a day (TID) | INTRAMUSCULAR | Status: DC
  Administered 2017-04-29 – 2017-04-30 (×2): 5000 [IU] via SUBCUTANEOUS
  Filled 2017-04-29 (×2): qty 1

## 2017-04-29 MED ORDER — LUBIPROSTONE 24 MCG PO CAPS
24.0000 ug | ORAL_CAPSULE | Freq: Two times a day (BID) | ORAL | Status: DC
  Administered 2017-04-29 – 2017-05-01 (×4): 24 ug via ORAL
  Filled 2017-04-29 (×5): qty 1

## 2017-04-29 MED ORDER — SODIUM CHLORIDE 0.9 % IV BOLUS (SEPSIS)
500.0000 mL | Freq: Once | INTRAVENOUS | Status: AC
  Administered 2017-04-29: 500 mL via INTRAVENOUS

## 2017-04-29 MED ORDER — PNEUMOCOCCAL VAC POLYVALENT 25 MCG/0.5ML IJ INJ
0.5000 mL | INJECTION | INTRAMUSCULAR | Status: AC
  Administered 2017-05-01: 0.5 mL via INTRAMUSCULAR
  Filled 2017-04-29: qty 0.5

## 2017-04-29 MED ORDER — CEFTRIAXONE SODIUM IN DEXTROSE 20 MG/ML IV SOLN: 1.0000 g | INTRAVENOUS | Status: DC

## 2017-04-29 MED ORDER — VITAMIN B-12 1000 MCG PO TABS
1000.0000 ug | ORAL_TABLET | Freq: Every day | ORAL | Status: DC
  Administered 2017-04-30 – 2017-05-01 (×2): 1000 ug via ORAL
  Filled 2017-04-29 (×2): qty 1

## 2017-04-29 MED ORDER — INSULIN ASPART 100 UNIT/ML ~~LOC~~ SOLN
0.0000 [IU] | Freq: Three times a day (TID) | SUBCUTANEOUS | Status: DC
  Administered 2017-04-30: 13:00:00 1 [IU] via SUBCUTANEOUS
  Filled 2017-04-29: qty 1

## 2017-04-29 MED ORDER — CEFTRIAXONE SODIUM IN DEXTROSE 20 MG/ML IV SOLN
1.0000 g | Freq: Once | INTRAVENOUS | Status: AC
  Administered 2017-04-29: 1 g via INTRAVENOUS
  Filled 2017-04-29: qty 50

## 2017-04-29 MED ORDER — ASPIRIN EC 81 MG PO TBEC
81.0000 mg | DELAYED_RELEASE_TABLET | Freq: Every day | ORAL | Status: DC
  Administered 2017-04-30 – 2017-05-01 (×2): 81 mg via ORAL
  Filled 2017-04-29 (×2): qty 1

## 2017-04-29 MED ORDER — DEXTROSE 5 % IV SOLN
250.0000 mg | INTRAVENOUS | Status: DC
  Administered 2017-04-30 (×2): 250 mg via INTRAVENOUS
  Filled 2017-04-29 (×4): qty 250

## 2017-04-29 MED ORDER — ATOMOXETINE HCL 40 MG PO CAPS
40.0000 mg | ORAL_CAPSULE | Freq: Every day | ORAL | Status: DC
  Administered 2017-04-30 – 2017-05-01 (×2): 40 mg via ORAL
  Filled 2017-04-29 (×3): qty 1

## 2017-04-29 MED ORDER — MOMETASONE FURO-FORMOTEROL FUM 200-5 MCG/ACT IN AERO
2.0000 | INHALATION_SPRAY | Freq: Two times a day (BID) | RESPIRATORY_TRACT | Status: DC
  Administered 2017-04-29 – 2017-05-01 (×4): 2 via RESPIRATORY_TRACT
  Filled 2017-04-29: qty 8.8

## 2017-04-29 MED ORDER — INFLUENZA VAC SPLIT HIGH-DOSE 0.5 ML IM SUSY
0.5000 mL | PREFILLED_SYRINGE | INTRAMUSCULAR | Status: AC
  Administered 2017-05-01: 10:00:00 0.5 mL via INTRAMUSCULAR
  Filled 2017-04-29: qty 0.5

## 2017-04-29 NOTE — ED Notes (Signed)
 bolus infusing. Started by EMS.

## 2017-04-29 NOTE — H&P (Signed)
[facility]   PATIENT NAME: Kristen Watts    MR#:  [id number]  DATE OF BIRTH:  09/08/1940  DATE OF ADMISSION:  04/29/2017  PRIMARY CARE PHYSICIAN: Barbette ReichmannHande, Vishwanath, MD   REQUESTING/REFERRING PHYSICIAN: Siadecki  CHIEF COMPLAINT:   Chief Complaint  Patient presents with  . Weakness  . Near Syncope    HISTORY OF PRESENT ILLNESS: Kristen Watts  is a 76 y.o. female with a known history of CHF, COPD, DM, Htn, HLD, Obesity, Thyroid disease- lives alone, on 2 ltr Home o2 at night. Walked to mail box and back to home- and still standing- felt very dizzi, and colours infront of her eyes, but did not pass out. Sit down in chair. Denies any SOB, Chest pain. Her helper at home came, she found her feeling dizzi, called EMS. Her blood sugar was fine. In ER her BP is on lower side, and she have mild UTI. Also have c/o cough and some yellowish sputum for 2-3 days. Renal func is slightly worse than baseline.  PAST MEDICAL HISTORY:   Past Medical History:  Diagnosis Date  . ADD (attention deficit disorder)   . Arthritis   . ASCVD (arteriosclerotic cardiovascular disease)   . Asthma   . Blood transfusion without reported diagnosis   . CHF (congestive heart failure) (HCC)   . COPD (chronic obstructive pulmonary disease) (HCC)   . Coronary artery disease   . Depression   . Diabetes mellitus without complication (HCC)   . Eczema   . GERD (gastroesophageal reflux disease)   . Hematuria   . Hyperlipidemia   . Hypertension   . Obesity   . Osteoarthritis   . Panic attacks   . Phlebitis   . Pulmonary embolism (HCC)   . PVD (peripheral vascular disease) (HCC)   . Sleep apnea   . Stroke (HCC)   . Thyroid disease     PAST SURGICAL HISTORY:  Past Surgical History:  Procedure Laterality Date  . ABDOMINAL HYSTERECTOMY    . BACK SURGERY    . BREAST BIOPSY Right    neg  . BREAST SURGERY    . CARDIAC SURGERY    . CHOLECYSTECTOMY    .  ESOPHAGOGASTRODUODENOSCOPY (EGD) WITH PROPOFOL N/A 12/09/2016   Procedure: ESOPHAGOGASTRODUODENOSCOPY (EGD) WITH PROPOFOL;  Surgeon: Christena DeemSkulskie, Martin U, MD;  Location[facility] ENDOSCOPY;  Service: Endoscopy;  Laterality: N/A;  . FRACTURE SURGERY    . TONSILLECTOMY      SOCIAL HISTORY:  Social History   Tobacco Use  . Smoking status: Former Smoker    Types: Cigarettes  . Smokeless tobacco: Never Used  Substance Use Topics  . Alcohol use: No    FAMILY HISTORY:  Family History  Problem Relation Age of Onset  . CAD Father   . Breast cancer Neg Hx     DRUG ALLERGIES:  Allergies  Allergen Reactions  . Clindamycin/Lincomycin   . Iodine Swelling  . Stadol [Butorphanol] Other (See Comments)    LOC  . Darvon [Propoxyphene] Rash  . Demerol [Meperidine] Palpitations  . Latex Rash    REVIEW OF SYSTEMS:   CONSTITUTIONAL: No fever,positive for fatigue or weakness.  EYES: No blurred or double vision.  EARS, NOSE, AND THROAT: No tinnitus or ear pain.  RESPIRATORY: No cough, shortness of breath, wheezing or hemoptysis.  CARDIOVASCULAR: No chest pain, orthopnea, edema.  GASTROINTESTINAL: No nausea, vomiting, diarrhea or abdominal pain.  GENITOURINARY: No dysuria, hematuria.  ENDOCRINE: No polyuria, nocturia,  HEMATOLOGY: No anemia, easy bruising or bleeding SKIN: No rash or lesion. MUSCULOSKELETAL: No joint pain or arthritis.   NEUROLOGIC: No tingling, numbness, weakness.  PSYCHIATRY: No anxiety or depression.   MEDICATIONS AT HOME:  Prior to Admission medications   Medication Sig Start Date End Date Taking? Authorizing Provider  AMITIZA 24 MCG capsule Take 1 capsule by mouth 2 (two) times daily. 03/31/17  Yes [provider]  aspirin EC 81 MG tablet Take 81 mg by mouth daily.   Yes [provider]  atomoxetine (STRATTERA) 40 MG capsule Take 40 mg by mouth daily.   Yes [provider]  carvedilol (COREG) 6.25 MG tablet Take 6.25 mg by mouth 2 (two) times  daily with a meal.   Yes [provider]  Cholecalciferol 10000 units TABS Take by mouth daily.   Yes [provider]  cyanocobalamin 1000 MCG tablet Take by mouth daily.   Yes [provider]  DEXILANT 60 MG capsule Take 1 capsule by mouth daily. 03/05/16  Yes [provider]  furosemide (LASIX) 40 MG tablet TAKE ONE TABLET BY MOUTH EVERY DAY AS NEEDED FOR EDEMA 02/12/16  Yes [provider]  Iodoquinol-HC-Aloe Polysacch (ALCORTIN A) 1-2-1 % GEL Apply topically.   Yes [provider]  isosorbide mononitrate (IMDUR) 60 MG 24 hr tablet Take 60 mg by mouth daily. 02/12/16  Yes [provider]  losartan (COZAAR) 50 MG tablet Take 50 mg by mouth daily. 03/31/17  Yes [provider]  metFORMIN (GLUCOPHAGE) 500 MG tablet TAKE ONE TABLET BY MOUTH EVERY MORNING WITH BREAKFAST 03/12/16  Yes [provider]  omega-3 acid ethyl esters (LOVAZA) 1 g capsule Take 1 capsule by mouth 2 (two) times daily. 02/23/16  Yes [provider]  SYNTHROID 150 MCG tablet Take 150 mcg by mouth every morning. 03/05/16  Yes [provider]  thiamine 100 MG tablet Take 100 mg by mouth daily.   Yes [provider]  vitamin E 1000 UNIT capsule Take 1,000 Units by mouth daily.   Yes [provider]  zinc gluconate 50 MG tablet Take 50 mg by mouth daily.   Yes [provider]  ADVAIR DISKUS 250-50 MCG/DOSE AEPB INHALE 1 PUFF INTO THE LUNGS EVERY 12 HOURS 02/12/16   [provider]  albuterol (PROVENTIL HFA;VENTOLIN HFA) 108 (90 Base) MCG/ACT inhaler Inhale 1-2 puffs into the lungs every 4 (four) hours as needed for wheezing or shortness of breath.    [provider]  albuterol (PROVENTIL) (2.5 MG/3ML) 0.083% nebulizer solution Take 2.5 mg by nebulization every 4 (four) hours as needed for wheezing or shortness of breath.    [provider]  ALPRAZolam Prudy Feeler(XANAX) 0.25 MG tablet Take 0.25 mg by  mouth at bedtime as needed. for sleep 02/27/16   [provider]  clotrimazole-betamethasone (LOTRISONE) cream Apply topically 2 (two) times daily. 01/07/16   [provider]  diphenhydrAMINE (BENADRYL) 25 MG tablet Take 100 mg by mouth 2 (two) times daily.    [provider]  ibuprofen (ADVIL,MOTRIN) 600 MG tablet Take 1 tablet (600 mg total) by mouth every 8 (eight) hours as needed. Patient taking differently: Take 600 mg by mouth every 6 (six) hours as needed.  11/15/15   Tommi RumpsSummers, Rhonda L, PA-C  ipratropium-albuterol (DUONEB) 0.5-2.5 (3) MG/3ML SOLN Take 3 mLs by nebulization every 4 (four) hours as needed.    [provider]  nitroGLYCERIN (NITROSTAT) 0.4 MG SL tablet Place 0.4 mg under the tongue  every 5 (five) minutes as needed for chest pain.    [provider]  nystatin cream (MYCOSTATIN) Apply 1 application topically 2 (two) times daily. 11/06/15   Tommi Rumps, PA-C      PHYSICAL EXAMINATION:   VITAL SIGNS: Blood pressure (!) 82/41, pulse 69, temperature 97.9 F (36.6 C), temperature source Oral, resp. rate (!) 28, height 4\' 10"  (1.473 m), weight 81.6 kg (180 lb), SpO2 98 %.  GENERAL:  76 y.o.-year-old patient lying in the bed with no acute distress.  EYES: Pupils equal, round, reactive to light and accommodation. No scleral icterus. Extraocular muscles intact.  HEENT: Head atraumatic, normocephalic. Oropharynx and nasopharynx clear.  NECK:  Supple, no jugular venous distention. No thyroid enlargement, no tenderness.  LUNGS: Normal breath sounds bilaterally, no wheezing, rales,rhonchi or crepitation. No use of accessory muscles of respiration.  CARDIOVASCULAR: S1, S2 normal. No murmurs, rubs, or gallops.  ABDOMEN: Soft, nontender, nondistended. Bowel sounds present. No organomegaly or mass.  EXTREMITIES: No pedal edema, cyanosis, or clubbing.  NEUROLOGIC: Cranial nerves II through XII are intact. Muscle strength 4/5 in all extremities.  Sensation intact. Gait not checked.  PSYCHIATRIC: The patient is alert and oriented x 3.  SKIN: No obvious rash, lesion, or ulcer.   LABORATORY PANEL:   CBC Recent Labs  Lab 04/29/17 1539  WBC 10.1  HGB 11.4*  HCT 35.5  PLT 217  MCV 80.3  MCH 25.7*  MCHC 32.0  RDW 15.2*  LYMPHSABS 2.2  MONOABS 0.9  EOSABS 0.3  BASOSABS 0.1   ------------------------------------------------------------------------------------------------------------------  Chemistries  Recent Labs  Lab 04/29/17 1539  NA 137  K 4.2  CL 103  CO2 25  GLUCOSE 153*  BUN 22*  CREATININE 1.25*  CALCIUM 8.7*   ------------------------------------------------------------------------------------------------------------------ estimated creatinine clearance is 35.1 mL/min (A) (by C-G formula based on SCr of 1.25 mg/dL (H)). ------------------------------------------------------------------------------------------------------------------ No results for input(s): TSH, T4TOTAL, T3FREE, THYROIDAB in the last 72 hours.  Invalid input(s): FREET3   Coagulation profile No results for input(s): INR, PROTIME in the last 168 hours. ------------------------------------------------------------------------------------------------------------------- No results for input(s): DDIMER in the last 72 hours. -------------------------------------------------------------------------------------------------------------------  Cardiac Enzymes Recent Labs  Lab 04/29/17 1539  TROPONINI <0.03   ------------------------------------------------------------------------------------------------------------------ Invalid input(s): POCBNP  ---------------------------------------------------------------------------------------------------------------  Urinalysis    Component Value Date/Time   COLORURINE YELLOW (A) 04/29/2017 1539   APPEARANCEUR CLEAR (A) 04/29/2017 1539   LABSPEC 1.008 04/29/2017 1539   PHURINE 6.0 04/29/2017  1539   GLUCOSEU NEGATIVE 04/29/2017 1539   HGBUR NEGATIVE 04/29/2017 1539   BILIRUBINUR NEGATIVE 04/29/2017 1539   KETONESUR NEGATIVE 04/29/2017 1539   PROTEINUR NEGATIVE 04/29/2017 1539   NITRITE NEGATIVE 04/29/2017 1539   LEUKOCYTESUR MODERATE (A) 04/29/2017 1539     RADIOLOGY: Dg Chest Portable 1 View  Result Date: 04/29/2017 CLINICAL DATA:  Acute onset weakness and near-syncope today. EXAM: PORTABLE CHEST 1 VIEW COMPARISON:  PA and lateral chest 07/01/2016. FINDINGS: The lungs are clear. Heart size is normal. No pneumothorax or pleural effusion. Aortic atherosclerosis noted. No acute bony abnormality. IMPRESSION: No acute disease. Atherosclerosis. Electronically Signed   By: Drusilla Kanner M.D.   On: 04/29/2017 16:15    EKG: Orders placed or performed during the hospital encounter of 04/29/17  . ED EKG  . ED EKG  . EKG 12-Lead  . EKG 12-Lead    IMPRESSION AND PLAN:  * presyncopal episdoe   Likely Hypotension, and Infection   IV fluids, monitor orthostatic.   Hold BP meds for now.  * Ac  renal insufficiency   Slightly worse renal func   IV fluids and monitor.    Hold lasix and metformin  * UTI   Acute bronchitis   IV rocephin+ azithromycin    Follow urine cx  * Htn    Hold all meds    * DM   Hold metformin    Keep on ISS.  * COPD- no exacerbation    Cont home inhalers and nebs  All the records are reviewed and case discussed with ED provider. Management plans discussed with the patient, family and they are in agreement.  CODE STATUS: Full. Code Status History    Date Active Date Inactive Code Status Order ID Comments User Context   07/01/2016 22:59 07/02/2016 18:14 Full Code [id number]  Tonye Royalty, DO Inpatient   10/19/2014 17:37 10/20/2014 18:35 Full Code [id number]  Velna Hatchet, RN ED       TOTAL TIME TAKING CARE OF THIS PATIENT: 45 minutes.    Altamese Dilling M.D on 04/29/2017   Between 7am to 6pm - Pager - [phone]  After 6pm  go to [email or web address] - password [email or web address]  [facility]  Office  [phone]  CC: Primary care physician; Barbette Reichmann, MD   Note: This dictation was prepared with Dragon dictation along with smaller phrase technology. Any transcriptional errors that result from this process are unintentional.

## 2017-04-29 NOTE — ED Notes (Signed)
Dr Elisabeth PigeonVachhani informed of floors concerns about blood pressure and order received for the inpatient RN to call the MD if patients blood pressure is less than 90 systolic on the next orthostatic reading.

## 2017-04-29 NOTE — ED Triage Notes (Signed)
Pt arrived via EMS from home d/t sudden onset of weakness and near syncope while sitting in her chair at home. Pt reports feeling flushed and lightheaded. Pt reports nausea but denies any pain at this time. Pt is on 3 L of oxygen at home.

## 2017-04-29 NOTE — ED Provider Notes (Signed)
[facility] Emergency Department Provider Note ____________________________________________   First MD Initiated Contact with Patient 04/29/17 1531     (approximate)  I have reviewed the triage vital signs and the nursing notes.   HISTORY  Chief Complaint Weakness and Near Syncope    HPI Kristen Watts is a 76 y.o. female with past medical history as noted below including COPD on home O2, who presents with generalized weakness and lightheadedness over the last few hours, acute onset, and associated with near syncope when EMS attempted to do orthostatics on her.  Patient states that at that time she started to see black spots in her vision, and felt like she was going to pass out.  She reports chronic shortness of breath, but no acute change.  She denies chest pain, fever, vomiting or diarrhea, or urinary symptoms.  Patient states that she did forget to use her home O2 for most of the day yesterday, but realized this in the evening.  She also reports feeling a knot in her throat and some wheezing over the last day.   Past Medical History:  Diagnosis Date  . ADD (attention deficit disorder)   . Arthritis   . ASCVD (arteriosclerotic cardiovascular disease)   . Asthma   . Blood transfusion without reported diagnosis   . CHF (congestive heart failure) (HCC)   . COPD (chronic obstructive pulmonary disease) (HCC)   . Coronary artery disease   . Depression   . Diabetes mellitus without complication (HCC)   . Eczema   . GERD (gastroesophageal reflux disease)   . Hematuria   . Hyperlipidemia   . Hypertension   . Obesity   . Osteoarthritis   . Panic attacks   . Phlebitis   . Pulmonary embolism (HCC)   . PVD (peripheral vascular disease) (HCC)   . Sleep apnea   . Stroke (HCC)   . Thyroid disease     Patient Active Problem List   Diagnosis Date Noted  . Varicose veins of bilateral lower extremities with pain 02/01/2017  . Diabetes (HCC) 08/20/2016  .  Essential hypertension, benign 08/20/2016  . COPD (chronic obstructive pulmonary disease) (HCC) 08/20/2016  . Pain in limb 08/20/2016  . Swelling of limb 08/20/2016  . Syncope 07/01/2016    Past Surgical History:  Procedure Laterality Date  . ABDOMINAL HYSTERECTOMY    . BACK SURGERY    . BREAST BIOPSY Right    neg  . BREAST SURGERY    . CARDIAC SURGERY    . CHOLECYSTECTOMY    . ESOPHAGOGASTRODUODENOSCOPY (EGD) WITH PROPOFOL N/A 12/09/2016   Procedure: ESOPHAGOGASTRODUODENOSCOPY (EGD) WITH PROPOFOL;  Surgeon: Christena Deem, MD;  Location: [facility] ENDOSCOPY;  Service: Endoscopy;  Laterality: N/A;  . FRACTURE SURGERY    . TONSILLECTOMY      Prior to Admission medications   Medication Sig Start Date End Date Taking? Authorizing Provider  ADVAIR DISKUS 250-50 MCG/DOSE AEPB INHALE 1 PUFF INTO THE LUNGS EVERY 12 HOURS 02/12/16   [provider]  albuterol (PROVENTIL HFA;VENTOLIN HFA) 108 (90 Base) MCG/ACT inhaler Inhale 1-2 puffs into the lungs every 4 (four) hours as needed for wheezing or shortness of breath.    [provider]  albuterol (PROVENTIL) (2.5 MG/3ML) 0.083% nebulizer solution Take 2.5 mg by nebulization every 4 (four) hours as needed for wheezing or shortness of breath.    [provider]  ALPRAZolam Prudy Feeler) 0.25 MG tablet Take 0.25 mg by mouth at bedtime as needed. for sleep  02/27/16   [provider]  aspirin EC 81 MG tablet Take 81 mg by mouth daily.    [provider]  atomoxetine (STRATTERA) 40 MG capsule Take 40 mg by mouth daily.    [provider]  buPROPion (WELLBUTRIN SR) 150 MG 12 hr tablet Take 150 mg by mouth 2 (two) times daily.    [provider]  carvedilol (COREG) 6.25 MG tablet Take 6.25 mg by mouth 2 (two) times daily with a meal.    [provider]  Cholecalciferol 10000 units TABS Take by mouth daily.    [provider]  citalopram (CELEXA) 10 MG tablet Take 1 tablet (10 mg  total) by mouth daily. 10/20/14 09/26/16  Jene EveryKinner, Robert, MD  clotrimazole-betamethasone (LOTRISONE) cream Apply topically 2 (two) times daily. 01/07/16   [provider]  cyanocobalamin 1000 MCG tablet Take by mouth daily.    [provider]  DEXILANT 60 MG capsule Take 1 capsule by mouth daily. 03/05/16   [provider]  diphenhydrAMINE (BENADRYL) 25 MG tablet Take 100 mg by mouth 2 (two) times daily.    [provider]  furosemide (LASIX) 40 MG tablet TAKE ONE TABLET BY MOUTH EVERY DAY AS NEEDED FOR EDEMA 02/12/16   [provider]  gabapentin (NEURONTIN) 100 MG capsule Take 100 mg by mouth at bedtime.    [provider]  ibuprofen (ADVIL,MOTRIN) 600 MG tablet Take 1 tablet (600 mg total) by mouth every 8 (eight) hours as needed. Patient taking differently: Take 600 mg by mouth every 6 (six) hours as needed.  11/15/15   Tommi RumpsSummers, Rhonda L, PA-C  Iodoquinol-HC-Aloe Polysacch (ALCORTIN A) 1-2-1 % GEL Apply topically.    [provider]  ipratropium-albuterol (DUONEB) 0.5-2.5 (3) MG/3ML SOLN Take 3 mLs by nebulization every 4 (four) hours as needed.    [provider]  iron polysaccharides (NIFEREX) 150 MG capsule Take 150 mg by mouth daily.    [provider]  isosorbide mononitrate (IMDUR) 60 MG 24 hr tablet Take 60 mg by mouth daily. 02/12/16   [provider]  metFORMIN (GLUCOPHAGE) 500 MG tablet TAKE ONE TABLET BY MOUTH EVERY MORNING WITH BREAKFAST 03/12/16   [provider]  nitroGLYCERIN (NITROSTAT) 0.4 MG SL tablet Place 0.4 mg under the tongue every 5 (five) minutes as needed for chest pain.    [provider]  nystatin cream (MYCOSTATIN) Apply 1 application topically 2 (two) times daily. 11/06/15   Tommi RumpsSummers, Rhonda L, PA-C  omega-3 acid ethyl esters (LOVAZA) 1 g capsule Take 1 capsule by mouth 2 (two) times daily. 02/23/16   [provider]  Potassium 99 MG TABS Take 1 tablet by mouth  daily.    [provider]  predniSONE (DELTASONE) 20 MG tablet Take 3 tablets (60 mg total) by mouth daily. Patient not taking: Reported on 09/26/2016 05/04/16   Rockne MenghiniNorman, Anne-Caroline, MD  risperiDONE (RISPERDAL) 1 MG tablet Take 1 tablet (1 mg total) by mouth at bedtime. Patient taking differently: Take 0.5 mg by mouth at bedtime.  10/20/14 09/26/16  Jene EveryKinner, Robert, MD  SYNTHROID 150 MCG tablet Take 150 mcg by mouth every morning. 03/05/16   [provider]  thiamine 100 MG tablet Take 100 mg by mouth daily.    [provider]  valsartan (DIOVAN) 80 MG tablet Take 1 tablet by mouth once daily. 02/12/16   [provider]  vitamin E 1000 UNIT capsule Take 1,000 Units by mouth daily.    [provider]  zinc gluconate 50 MG tablet Take 50 mg by mouth daily.    [provider]    Allergies Clindamycin/lincomycin; Iodine; Stadol [butorphanol]; Darvon [propoxyphene]; Demerol [meperidine]; and Latex  Family History  Problem Relation Age of Onset  . Breast cancer Neg Hx     Social History Social History   Tobacco Use  . Smoking status: Former Smoker    Types: Cigarettes  . Smokeless tobacco: Never Used  Substance Use Topics  . Alcohol use: No  . Drug use: No    Review of Systems  Constitutional: No fever/chills.  Eyes: No redness. ENT: Positive for throat discomfort. Cardiovascular: Denies chest pain. Respiratory: Positive for chronic shortness of breath. Gastrointestinal: No nausea, no vomiting.  No diarrhea.  Genitourinary: Negative for dysuria.  Musculoskeletal: Negative for back pain. Skin: Negative for rash. Neurological: Negative for headache.   ____________________________________________   PHYSICAL EXAM:  VITAL SIGNS: ED Triage Vitals  Enc Vitals Group     BP      Pulse      Resp      Temp      Temp src      SpO2      Weight      Height      Head Circumference      Peak Flow      Pain Score      Pain Loc       Pain Edu?      Excl. in GC?     Constitutional: Alert and oriented. Well appearing and in no acute distress. Eyes: Conjunctivae are normal.  Head: Atraumatic. Nose: No congestion/rhinnorhea. Mouth/Throat: Mucous membranes are dry.  Oropharynx is clear. Neck: Normal range of motion.  Cardiovascular: Normal rate, regular rhythm. Grossly normal heart sounds.  Good peripheral circulation. Respiratory: Normal respiratory effort.  No retractions.  Lungs with decreased breath sounds and scattered wheezes bilaterally. Gastrointestinal: Soft and nontender. No distention.  Genitourinary: No CVA tenderness. Musculoskeletal: No lower extremity edema.  Extremities warm and well perfused.  Neurologic:  Normal speech and language. No gross focal neurologic deficits are appreciated.  Skin:  Skin is warm and dry. No rash noted. Psychiatric: Mood and affect are normal. Speech and behavior are normal.  ____________________________________________   LABS (all labs ordered are listed, but only abnormal results are displayed)  Labs Reviewed  BASIC METABOLIC PANEL - Abnormal; Notable for the following components:      Result Value   Glucose, Bld 153 (*)    BUN 22 (*)    Creatinine, Ser 1.25 (*)    Calcium 8.7 (*)    GFR calc non Af Amer 41 (*)    GFR calc Af Amer 48 (*)    All other components within normal limits  CBC WITH DIFFERENTIAL/PLATELET - Abnormal; Notable for the following components:   Hemoglobin 11.4 (*)    MCH 25.7 (*)    RDW 15.2 (*)    Neutro Abs 6.6 (*)    All other components within normal limits  BLOOD GAS, VENOUS - Abnormal; Notable for the following components:   Bicarbonate 28.7 (*)    Acid-Base Excess 2.2 (*)    All other components within normal limits  URINALYSIS, COMPLETE (UACMP) WITH MICROSCOPIC - Abnormal; Notable for the following components:   Color, Urine YELLOW (*)    APPearance CLEAR (*)    Leukocytes, UA MODERATE (*)    Bacteria, UA RARE (*)     Squamous Epithelial / LPF 0-5 (*)  All other components within normal limits  TROPONIN I   ____________________________________________  EKG  ED ECG REPORT I, Dionne Bucy, the attending physician, personally viewed and interpreted this ECG.  Date: 04/29/2017 EKG Time: 1544 Rate: 67 Rhythm: normal sinus rhythm QRS Axis: normal Intervals: normal ST/T Wave abnormalities: T wave flattening lateral leads Narrative Interpretation: no evidence of acute ischemia; no significant change when compared to EKG of 09/26/2016  ____________________________________________  RADIOLOGY  CXR: No focal infiltrate or other acute findings  ____________________________________________   PROCEDURES  Procedure(s) performed: No    Critical Care performed: No ____________________________________________   INITIAL IMPRESSION / ASSESSMENT AND PLAN / ED COURSE  Pertinent labs & imaging results that were available during my care of the patient were reviewed by me and considered in my medical decision making (see chart for details).  76 year old female with past medical history as noted above presents with generalized weakness and lightheadedness for the last several hours today, associated with near syncope when EMS stood her up to do orthostatics on her.  Patient reports chronic shortness of breath with no acute change.  She does report that she forgot to use her oxygen for most the day yesterday, but realized it in the evening and restarted.  Per EMS, she did become slightly hypotensive with systolic of 79 when they stood her up.  Review of past medical records in epic reveals a in the ED visit in April of this year for dizziness but this was more vertiginous than lightheadedness like today.  She also had an admission in February after an episode of syncope thought to be orthostatic or related to dehydration.  On exam, vital signs are normal except for slightly low blood pressure, the  patient is relatively comfortable appearing, and the exam is significant for bilateral wheeze and slight decreased breath sounds, but other wise unremarkable.  Differential includes dehydration, other metabolic cause, infection, or possible COPD exacerbation that could have been precipitated by not using oxygen yesterday.  Lower suspicion for cardiac cause.  Plan for chest x-ray, labs and infection workup, EKG and troponin, gentle IV hydration, and reassess.  ----------------------------------------- 6:45 PM on 04/29/2017 -----------------------------------------  On reassessment, patient's blood pressure is significantly improved.  She feels slightly better, but still feels generally weak.  The workup reveals a UTI but no other significant findings.  Antibiotic's have been ordered.  Patient states that she does not feel well to go home, and given the resolved hypotension I think it is reasonable to observe the patient in the hospital and continue IV fluids and IV antibiotics for now.  Patient signed out to the hospitalist.     ____________________________________________   FINAL CLINICAL IMPRESSION(S) / ED DIAGNOSES  Final diagnoses:  Urinary tract infection without hematuria, site unspecified  Generalized weakness      NEW MEDICATIONS STARTED DURING THIS VISIT:  This SmartLink is deprecated. Use AVSMEDLIST instead to display the medication list for a patient.   Note:  This document was prepared using Dragon voice recognition software and may include unintentional dictation errors.     Dionne Bucy, MD 04/29/17 [phone]

## 2017-04-30 ENCOUNTER — Encounter: Payer: Self-pay | Admitting: *Deleted

## 2017-04-30 DIAGNOSIS — J440 Chronic obstructive pulmonary disease with acute lower respiratory infection: Secondary | ICD-10-CM | POA: Diagnosis present

## 2017-04-30 DIAGNOSIS — K219 Gastro-esophageal reflux disease without esophagitis: Secondary | ICD-10-CM | POA: Diagnosis present

## 2017-04-30 DIAGNOSIS — E039 Hypothyroidism, unspecified: Secondary | ICD-10-CM | POA: Diagnosis present

## 2017-04-30 DIAGNOSIS — I110 Hypertensive heart disease with heart failure: Secondary | ICD-10-CM | POA: Diagnosis present

## 2017-04-30 DIAGNOSIS — F410 Panic disorder [episodic paroxysmal anxiety] without agoraphobia: Secondary | ICD-10-CM | POA: Diagnosis present

## 2017-04-30 DIAGNOSIS — N390 Urinary tract infection, site not specified: Secondary | ICD-10-CM | POA: Diagnosis present

## 2017-04-30 DIAGNOSIS — E860 Dehydration: Secondary | ICD-10-CM | POA: Diagnosis present

## 2017-04-30 DIAGNOSIS — J209 Acute bronchitis, unspecified: Secondary | ICD-10-CM | POA: Diagnosis present

## 2017-04-30 DIAGNOSIS — Z23 Encounter for immunization: Secondary | ICD-10-CM | POA: Diagnosis present

## 2017-04-30 DIAGNOSIS — N179 Acute kidney failure, unspecified: Secondary | ICD-10-CM | POA: Diagnosis present

## 2017-04-30 DIAGNOSIS — Z8249 Family history of ischemic heart disease and other diseases of the circulatory system: Secondary | ICD-10-CM | POA: Diagnosis not present

## 2017-04-30 DIAGNOSIS — I2510 Atherosclerotic heart disease of native coronary artery without angina pectoris: Secondary | ICD-10-CM | POA: Diagnosis present

## 2017-04-30 DIAGNOSIS — G4733 Obstructive sleep apnea (adult) (pediatric): Secondary | ICD-10-CM | POA: Diagnosis present

## 2017-04-30 DIAGNOSIS — E1151 Type 2 diabetes mellitus with diabetic peripheral angiopathy without gangrene: Secondary | ICD-10-CM | POA: Diagnosis present

## 2017-04-30 DIAGNOSIS — I5033 Acute on chronic diastolic (congestive) heart failure: Secondary | ICD-10-CM | POA: Diagnosis present

## 2017-04-30 DIAGNOSIS — I951 Orthostatic hypotension: Secondary | ICD-10-CM | POA: Diagnosis present

## 2017-04-30 DIAGNOSIS — E785 Hyperlipidemia, unspecified: Secondary | ICD-10-CM | POA: Diagnosis present

## 2017-04-30 DIAGNOSIS — R531 Weakness: Secondary | ICD-10-CM | POA: Diagnosis present

## 2017-04-30 DIAGNOSIS — Z87891 Personal history of nicotine dependence: Secondary | ICD-10-CM | POA: Diagnosis not present

## 2017-04-30 LAB — CBC
HCT: 34.9 % — ABNORMAL LOW (ref 35.0–47.0)
HEMOGLOBIN: 11.4 g/dL — AB (ref 12.0–16.0)
MCH: 26.1 pg (ref 26.0–34.0)
MCHC: 32.6 g/dL (ref 32.0–36.0)
MCV: 80.2 fL (ref 80.0–100.0)
PLATELETS: 164 10*3/uL (ref 150–440)
RBC: 4.35 MIL/uL (ref 3.80–5.20)
RDW: 14.9 % — AB (ref 11.5–14.5)
WBC: 7.4 10*3/uL (ref 3.6–11.0)

## 2017-04-30 LAB — GLUCOSE, CAPILLARY
GLUCOSE-CAPILLARY: 103 mg/dL — AB (ref 65–99)
GLUCOSE-CAPILLARY: 125 mg/dL — AB (ref 65–99)
Glucose-Capillary: 108 mg/dL — ABNORMAL HIGH (ref 65–99)
Glucose-Capillary: 129 mg/dL — ABNORMAL HIGH (ref 65–99)

## 2017-04-30 LAB — BASIC METABOLIC PANEL
ANION GAP: 8 (ref 5–15)
BUN: 18 mg/dL (ref 6–20)
CALCIUM: 8.3 mg/dL — AB (ref 8.9–10.3)
CO2: 26 mmol/L (ref 22–32)
CREATININE: 0.87 mg/dL (ref 0.44–1.00)
Chloride: 103 mmol/L (ref 101–111)
GFR calc Af Amer: >60 mL/min (ref >60)
GLUCOSE: 112 mg/dL — AB (ref 65–99)
Potassium: 3.8 mmol/L (ref 3.5–5.1)
Sodium: 137 mmol/L (ref 135–145)

## 2017-04-30 MED ORDER — POLYETHYLENE GLYCOL 3350 17 G PO PACK
17.0000 g | PACK | Freq: Every day | ORAL | Status: DC
  Administered 2017-05-01: 17 g via ORAL
  Filled 2017-04-30 (×2): qty 1

## 2017-04-30 MED ORDER — SENNOSIDES-DOCUSATE SODIUM 8.6-50 MG PO TABS
1.0000 | ORAL_TABLET | Freq: Two times a day (BID) | ORAL | Status: DC
  Administered 2017-04-30 – 2017-05-01 (×2): 1 via ORAL
  Filled 2017-04-30 (×2): qty 1

## 2017-04-30 MED ORDER — ENOXAPARIN SODIUM 40 MG/0.4ML ~~LOC~~ SOLN
40.0000 mg | SUBCUTANEOUS | Status: DC
  Administered 2017-04-30: 40 mg via SUBCUTANEOUS
  Filled 2017-04-30: qty 0.4

## 2017-04-30 NOTE — Progress Notes (Signed)
[employer] [facility] -  at [facility]   PATIENT NAME: Kristen Watts    MR#:  [id number]  DATE OF BIRTH:  09/20/1940  SUBJECTIVE:   Patient here due to generalized weakness and near syncope. Feels much better today. No other acute events overnight. Noted to have urinary tract infection.  REVIEW OF SYSTEMS:    Review of Systems  Constitutional: Negative for chills and fever.  HENT: Negative for congestion and tinnitus.   Eyes: Negative for blurred vision and double vision.  Respiratory: Negative for cough, shortness of breath and wheezing.   Cardiovascular: Negative for chest pain, orthopnea and PND.  Gastrointestinal: Negative for abdominal pain, diarrhea, nausea and vomiting.  Genitourinary: Negative for dysuria and hematuria.  Neurological: Positive for weakness. Negative for dizziness, sensory change and focal weakness.  All other systems reviewed and are negative.   Nutrition: Heart Healthy/Carb modified Tolerating Diet: Yes Tolerating PT: Await Eval.    DRUG ALLERGIES:   Allergies  Allergen Reactions  . Clindamycin/Lincomycin   . Iodine Swelling  . Stadol [Butorphanol] Other (See Comments)    LOC  . Darvon [Propoxyphene] Rash  . Demerol [Meperidine] Palpitations  . Latex Rash    VITALS:  Blood pressure 123/61, pulse 78, temperature 98.7 F (37.1 C), resp. rate 20, height 4\' 10"  (1.473 m), weight 78.4 kg (172 lb 14.4 oz), SpO2 96 %.  PHYSICAL EXAMINATION:   Physical Exam  GENERAL:  76 y.o.-year-old patient lying in bed in no acute distress.  EYES: Pupils equal, round, reactive to light and accommodation. No scleral icterus. Extraocular muscles intact.  HEENT: Head atraumatic, normocephalic. Oropharynx and nasopharynx clear.  NECK:  Supple, no jugular venous distention. No thyroid enlargement, no tenderness.  LUNGS: Normal breath sounds bilaterally, no wheezing, rales, rhonchi. No use of accessory muscles of respiration.  CARDIOVASCULAR: S1, S2  normal. No murmurs, rubs, or gallops.  ABDOMEN: Soft, nontender, nondistended. Bowel sounds present. No organomegaly or mass.  EXTREMITIES: No cyanosis, clubbing or edema b/l.    NEUROLOGIC: Cranial nerves II through XII are intact. No focal Motor or sensory deficits b/l.  Globally weak PSYCHIATRIC: The patient is alert and oriented x 3.   SKIN: No obvious rash, lesion, or ulcer.    LABORATORY PANEL:   CBC Recent Labs  Lab 04/30/17 0620  WBC 7.4  HGB 11.4*  HCT 34.9*  PLT 164   ------------------------------------------------------------------------------------------------------------------  Chemistries  Recent Labs  Lab 04/30/17 0620  NA 137  K 3.8  CL 103  CO2 26  GLUCOSE 112*  BUN 18  CREATININE 0.87  CALCIUM 8.3*   ------------------------------------------------------------------------------------------------------------------  Cardiac Enzymes Recent Labs  Lab 04/29/17 1539  TROPONINI <0.03   ------------------------------------------------------------------------------------------------------------------  RADIOLOGY:  Dg Chest Portable 1 View  Result Date: 04/29/2017 CLINICAL DATA:  Acute onset weakness and near-syncope today. EXAM: PORTABLE CHEST 1 VIEW COMPARISON:  PA and lateral chest 07/01/2016. FINDINGS: The lungs are clear. Heart size is normal. No pneumothorax or pleural effusion. Aortic atherosclerosis noted. No acute bony abnormality. IMPRESSION: No acute disease. Atherosclerosis. Electronically Signed   By: Drusilla Kannerhomas  Dalessio M.D.   On: 04/29/2017 16:15     ASSESSMENT AND PLAN:   76 year old female with past medical history of COPD, CHF, coronary artery disease, depression, diabetes, hypertension, hyperlipidemia, history of pulmonary embolism, obstructive sleep apnea who presents to the hospital due to generalized weakness and near syncopal episode and noted to have urinary tract infection.  1. Presyncope-related to patient's underlying urinary  tract infection and dehydration. -Improved  with IV fluids, patient is clinically asymptomatic now.  2. Acute kidney injury-secondary to dehydration and urinary tract infection. Renal function improved with IV fluids. Continue to hold diuretics for now. We'll discontinue IV fluids and encourage by mouth intake.  3. Urinary tract infection-continue IV Rocephin. Follow urine cultures.  4. Essential hypertension-remains normotensive presently. Continue to hold Coreg, Lasix for now.  5. Diabetes type 2 without compensation-hold metformin. Continue sliding scale insulin.  6. Hypothyroidism-continue Synthroid.  Await PT eval  All the records are reviewed and case discussed with Care Management/Social Worker. Management plans discussed with the patient, family and they are in agreement.  CODE STATUS: Full code  DVT Prophylaxis: Lovenox  TOTAL TIME TAKING CARE OF THIS PATIENT: 30 minutes.   POSSIBLE D/C IN 1-2 DAYS, DEPENDING ON CLINICAL CONDITION.   Houston SirenSAINANI,VIVEK J M.D on 04/30/2017 at 12:20 PM  Between 7am to 6pm - Pager - [phone]  After 6pm go to [email or web address] - Social research officer, governmentpassword [email or web address]  [facility]  Office  [phone]  CC: Primary care physician; Barbette ReichmannHande, Vishwanath, MD

## 2017-04-30 NOTE — Evaluation (Signed)
Physical Therapy Evaluation Patient Details Name: Kristen Watts MRN: [id number] DOB: 08/30/1940 Today's Date: 04/30/2017   History of Present Illness  pt admitted on 04/30/2017 with dx of syncopy. She has PMhx of CHF, COPD, DM, Htn, HLD, Obesity, Thyroid disease. she reports she went to get coffee and sat down then started feeling strange and of balanace and almost passed out but reported she did not lose conciousness.   Clinical Impression  Pt presents sitting in her recliner. She is A&O x 4 and reports no pain. Strength of LE is WFL bil. Initial BP assess at 121/52 prior treatment and after session was 136/69 with O2 remaining around 98-100% on 3 LPM O2. She was able to perform sit<> stand transfers independently and ambulated ~340 ft with RW with no report of feeling light headed or any pain. She would benefit from physical therapy to promote balance/ stability, and would benefit from continued physical therapy via outpatient upon discharge.     Follow Up Recommendations Outpatient PT    Equipment Recommendations  None recommended by PT(pt has standard walker at home)    Recommendations for Other Services       Precautions / Restrictions Precautions Precautions: None Restrictions Weight Bearing Restrictions: No      Mobility  Bed Mobility                  Transfers Overall transfer level: Independent Equipment used: Rolling walker (2 wheeled)(oxygen)                Ambulation/Gait Ambulation/Gait assistance: Supervision Ambulation Distance (Feet): 340 Feet Assistive device: Rolling walker (2 wheeled) Gait Pattern/deviations: Step-through pattern;Trunk flexed     General Gait Details: verbal cues required to walk with the RW and avoid having it lead her.   Stairs            Wheelchair Mobility    Modified Rankin (Stroke Patients Only)       Balance                                             Pertinent Vitals/Pain Pain  Assessment: No/denies pain    Home Living Family/patient expects to be discharged to:: Private residence Living Arrangements: Alone Available Help at Discharge: Available PRN/intermittently Type of Home: House Home Access: Level entry     Home Layout: One level Home Equipment: Environmental consultantWalker - standard      Prior Function Level of Independence: Independent with assistive device(s)         Comments: standared walker at home     Hand Dominance   Dominant Hand: Right    Extremity/Trunk Assessment   Upper Extremity Assessment Upper Extremity Assessment: Overall WFL for tasks assessed    Lower Extremity Assessment Lower Extremity Assessment: Overall WFL for tasks assessed       Communication   Communication: No difficulties  Cognition Arousal/Alertness: Awake/alert Behavior During Therapy: WFL for tasks assessed/performed Overall Cognitive Status: Within Functional Limits for tasks assessed                                        General Comments      Exercises     Assessment/Plan    PT Assessment Patient needs continued PT services  PT Problem List Decreased activity  tolerance;Decreased balance;Decreased knowledge of use of DME       PT Treatment Interventions DME instruction;Gait training;Therapeutic activities;Therapeutic exercise;Balance training;Patient/family education    PT Goals (Current goals can be found in the Care Plan section)  Acute Rehab PT Goals Patient Stated Goal: to go home PT Goal Formulation: With patient Time For Goal Achievement: 05/14/17 Potential to Achieve Goals: Good    Frequency Min 2X/week   Barriers to discharge        Co-evaluation               AM-PAC PT "6 Clicks" Daily Activity  Outcome Measure Difficulty turning over in bed (including adjusting bedclothes, sheets and blankets)?: A Little Difficulty moving from lying on back to sitting on the side of the bed? : A Little Difficulty sitting down on  and standing up from a chair with arms (e.g., wheelchair, bedside commode, etc,.)?: A Little Help needed moving to and from a bed to chair (including a wheelchair)?: A Little Help needed walking in hospital room?: A Little Help needed climbing 3-5 steps with a railing? : A Lot 6 Click Score: 17    End of Session Equipment Utilized During Treatment: Gait belt;Oxygen(3 LPM) Activity Tolerance: Patient tolerated treatment well Patient left: in chair;with call bell/phone within reach Nurse Communication: Mobility status(and vitals that were taken throughout session) PT Visit Diagnosis: Dizziness and giddiness (R42);Unsteadiness on feet (R26.81);Other abnormalities of gait and mobility (R26.89)    Time: [id number] PT Time Calculation (min) (ACUTE ONLY): 37 min   Charges:   PT Evaluation $PT Eval Low Complexity: 1 Low PT Treatments $Gait Training: 23-37 mins   PT G Codes:   PT G-Codes **NOT FOR INPATIENT CLASS** Functional Assessment Tool Used: AM-PAC 6 Clicks Basic Mobility;Clinical judgement Functional Limitation: Mobility: Walking and moving around Mobility: Walking and Moving Around Current Status ([id number]): At least 40 percent but less than 60 percent impaired, limited or restricted Mobility: Walking and Moving Around Goal Status [phone]): At least 20 percent but less than 40 percent impaired, limited or restricted    Lulu RidingKristoffer Vaanya Shambaugh PT, DPT, LAT, ATC  04/30/17  3:31 PM       Kristen Watts 04/30/2017, 3:28 PM

## 2017-05-01 LAB — URINE CULTURE

## 2017-05-01 LAB — GLUCOSE, CAPILLARY
Glucose-Capillary: 83 mg/dL (ref 65–99)
Glucose-Capillary: 132 mg/dL — ABNORMAL HIGH (ref 65–99)

## 2017-05-01 MED ORDER — LOSARTAN POTASSIUM 50 MG PO TABS
50.0000 mg | ORAL_TABLET | Freq: Every day | ORAL | Status: DC
  Administered 2017-05-01: 12:00:00 50 mg via ORAL
  Filled 2017-05-01: qty 1

## 2017-05-01 MED ORDER — CARVEDILOL 3.125 MG PO TABS: 6.2500 mg | ORAL_TABLET | Freq: Two times a day (BID) | ORAL | Status: DC

## 2017-05-01 MED ORDER — LEVOFLOXACIN 500 MG PO TABS: 500.0000 mg | ORAL_TABLET | Freq: Every day | ORAL | 0 refills | Status: AC

## 2017-05-01 NOTE — Progress Notes (Signed)
Discharge paperwork reviewed with patient who verbalized understanding of medication changes, new medication, and follow up appointment w/ PCP and outpatient PT. Patient wears chronic 3L at home PRN. Patient's friend to transport home.

## 2017-05-01 NOTE — Care Management Note (Signed)
Case Management Note  Patient Details  Name: Kristen Watts MRN: [id number] Date of Birth: 04/08/1941  Subjective/Objective:     Order for RW called to Shaune [facility]ermaine Jenkins [facility].                Action/Plan:   Expected Discharge Date:  05/01/17               Expected Discharge Plan:     In-House Referral:     Discharge planning Services     Post Acute Care Choice:    Choice offered to:     DME Arranged:    DME Agency:     HH Arranged:    HH Agency:     Status of Service:     If discussed at [employer] Meetings, dates discussed:    Additional Comments:  Cuthbert Turton A, RN 05/01/2017, 12:52 PM

## 2017-05-01 NOTE — Care Management Note (Signed)
Case Management Note  Patient Details  Name: Kristen Watts MRN: [id number] Date of Birth: 09/09/1940  Subjective/Objective:    Discharge to home with PT recommendation for OP-PT. To arrange OP-PT, Kristen Watts may call either her PCP or [facility] [phone] to request an OP-PT referral.                 Action/Plan:   Expected Discharge Date:  05/01/17               Expected Discharge Plan:     In-House Referral:     Discharge planning Services     Post Acute Care Choice:    Choice offered to:     DME Arranged:    DME Agency:     HH Arranged:    HH Agency:     Status of Service:     If discussed at [employer] Meetings, dates discussed:    Additional Comments:  Trino Higinbotham A, RN 05/01/2017, 9:51 AM

## 2017-05-01 NOTE — Discharge Summary (Signed)
[facility] [place] [facility]   PATIENT NAME: Kristen Watts    MR#:  [id number]  DATE OF BIRTH:  03/26/1941  DATE OF ADMISSION:  04/29/2017 ADMITTING PHYSICIAN: Altamese DillingVaibhavkumar Vachhani, MD  DATE OF DISCHARGE: 05/01/2017  PRIMARY CARE PHYSICIAN: Barbette ReichmannHande, Vishwanath, MD    ADMISSION DIAGNOSIS:  Generalized weakness [R53.1] Urinary tract infection without hematuria, site unspecified [N39.0]  DISCHARGE DIAGNOSIS:  Principal Problem:   Syncope Active Problems:   Acute lower UTI   Acute bronchitis   SECONDARY DIAGNOSIS:   Past Medical History:  Diagnosis Date  . ADD (attention deficit disorder)   . Arthritis   . ASCVD (arteriosclerotic cardiovascular disease)   . Asthma   . Blood transfusion without reported diagnosis   . CHF (congestive heart failure) (HCC)   . COPD (chronic obstructive pulmonary disease) (HCC)   . Coronary artery disease   . Depression   . Diabetes mellitus without complication (HCC)   . Eczema   . GERD (gastroesophageal reflux disease)   . Hematuria   . Hyperlipidemia   . Hypertension   . Obesity   . Osteoarthritis   . Panic attacks   . Phlebitis   . Pulmonary embolism (HCC)   . PVD (peripheral vascular disease) (HCC)   . Sleep apnea   . Stroke (HCC)   . Thyroid disease     HOSPITAL COURSE:   76 year old female with past medical history of COPD, CHF, coronary artery disease, depression, diabetes, hypertension, hyperlipidemia, history of pulmonary embolism, obstructive sleep apnea who presents to the hospital due to generalized weakness and near syncopal episode and noted to have urinary tract infection.  1. Presyncope-related to patient's underlying urinary tract infection and dehydration. -Patient was given IV fluids and her symptoms have improved. She is no longer presyncopal and ambulated and is doing well and has no orthostatic hypotension therefore being discharged home.  2. Acute kidney injury-secondary to  dehydration and urinary tract infection.  -Patient was given IV fluids and given IV antibiotics for UTI and her renal function has improved and is back to baseline now.  3. Urinary tract infection-while in the hospital patient was given IV Rocephin and now is being discharged on oral Levaquin. Urine cultures remained negative.  4. Essential hypertension-patient's blood pressure meds were held initially due to some relative hypotension although they have been resumed now. She is not orthostatic. Patient has been taken off her Imdur, but will continue her Coreg, losartan, Lasix as needed.  5. Diabetes type 2 without compensation-on the hospital patient was in sliding scale insulin but not being discharged back on her metformin as her renal function has normalized.  6. Hypothyroidism- she will  continue Synthroid.  Discharge home with outpatient PT.    DISCHARGE CONDITIONS:   Stable.   CONSULTS OBTAINED:    DRUG ALLERGIES:   Allergies  Allergen Reactions  . Clindamycin/Lincomycin   . Iodine Swelling  . Stadol [Butorphanol] Other (See Comments)    LOC  . Darvon [Propoxyphene] Rash  . Demerol [Meperidine] Palpitations  . Latex Rash    DISCHARGE MEDICATIONS:   Allergies as of 05/01/2017      Reactions   Clindamycin/lincomycin    Iodine Swelling   Stadol [butorphanol] Other (See Comments)   LOC   Darvon [propoxyphene] Rash   Demerol [meperidine] Palpitations   Latex Rash      Medication List    STOP taking these medications   isosorbide mononitrate 60 MG 24 hr tablet Commonly known as:  IMDUR     TAKE these medications   ADVAIR DISKUS 250-50 MCG/DOSE Aepb Generic drug:  Fluticasone-Salmeterol INHALE 1 PUFF INTO THE LUNGS EVERY 12 HOURS   albuterol (2.5 MG/3ML) 0.083% nebulizer solution Commonly known as:  PROVENTIL Take 2.5 mg by nebulization every 4 (four) hours as needed for wheezing or shortness of breath.   albuterol 108 (90 Base) MCG/ACT  inhaler Commonly known as:  PROVENTIL HFA;VENTOLIN HFA Inhale 1-2 puffs into the lungs every 4 (four) hours as needed for wheezing or shortness of breath.   ALCORTIN A 1-2-1 % Gel Apply topically.   ALPRAZolam 0.25 MG tablet Commonly known as:  XANAX Take 0.25 mg by mouth at bedtime as needed. for sleep   AMITIZA 24 MCG capsule Generic drug:  lubiprostone Take 1 capsule by mouth 2 (two) times daily.   aspirin EC 81 MG tablet Take 81 mg by mouth daily.   atomoxetine 40 MG capsule Commonly known as:  STRATTERA Take 40 mg by mouth daily.   carvedilol 6.25 MG tablet Commonly known as:  COREG Take 6.25 mg by mouth 2 (two) times daily with a meal.   Cholecalciferol 10000 units Tabs Take by mouth daily.   clotrimazole-betamethasone cream Commonly known as:  LOTRISONE Apply topically 2 (two) times daily.   cyanocobalamin 1000 MCG tablet Take by mouth daily.   DEXILANT 60 MG capsule Generic drug:  dexlansoprazole Take 1 capsule by mouth daily.   diphenhydrAMINE 25 MG tablet Commonly known as:  BENADRYL Take 100 mg by mouth 2 (two) times daily.   furosemide 40 MG tablet Commonly known as:  LASIX TAKE ONE TABLET BY MOUTH EVERY DAY AS NEEDED FOR EDEMA   ibuprofen 600 MG tablet Commonly known as:  ADVIL,MOTRIN Take 1 tablet (600 mg total) by mouth every 8 (eight) hours as needed. What changed:  when to take this   ipratropium-albuterol 0.5-2.5 (3) MG/3ML Soln Commonly known as:  DUONEB Take 3 mLs by nebulization every 4 (four) hours as needed.   levofloxacin 500 MG tablet Commonly known as:  LEVAQUIN Take 1 tablet (500 mg total) by mouth daily for 5 days.   losartan 50 MG tablet Commonly known as:  COZAAR Take 50 mg by mouth daily.   metFORMIN 500 MG tablet Commonly known as:  GLUCOPHAGE TAKE ONE TABLET BY MOUTH EVERY MORNING WITH BREAKFAST   nitroGLYCERIN 0.4 MG SL tablet Commonly known as:  NITROSTAT Place 0.4 mg under the tongue every 5 (five) minutes as  needed for chest pain.   nystatin cream Commonly known as:  MYCOSTATIN Apply 1 application topically 2 (two) times daily.   omega-3 acid ethyl esters 1 g capsule Commonly known as:  LOVAZA Take 1 capsule by mouth 2 (two) times daily.   SYNTHROID 150 MCG tablet Generic drug:  levothyroxine Take 150 mcg by mouth every morning.   thiamine 100 MG tablet Take 100 mg by mouth daily.   vitamin E 1000 UNIT capsule Take 1,000 Units by mouth daily.   zinc gluconate 50 MG tablet Take 50 mg by mouth daily.            Durable Medical Equipment  (From admission, onward)        Start     Ordered   05/01/17 1208  For home use only DME Walker rolling  Once    Question:  Patient needs a walker to treat with the following condition  Answer:  Weakness with dizziness   05/01/17 1209  DISCHARGE INSTRUCTIONS:   DIET:  Cardiac diet and Diabetic diet  DISCHARGE CONDITION:  Stable  ACTIVITY:  Activity as tolerated  OXYGEN:  Home Oxygen: Yes.     Oxygen Delivery: 2 liters/min via Patient connected to nasal cannula oxygen  DISCHARGE LOCATION:  home   If you experience worsening of your admission symptoms, develop shortness of breath, life threatening emergency, suicidal or homicidal thoughts you must seek medical attention immediately by calling 911 or calling your MD immediately  if symptoms less severe.  You Must read complete instructions/literature along with all the possible adverse reactions/side effects for all the Medicines you take and that have been prescribed to you. Take any new Medicines after you have completely understood and accpet all the possible adverse reactions/side effects.   Please note  You were cared for by a hospitalist during your hospital stay. If you have any questions about your discharge medications or the care you received while you were in the hospital after you are discharged, you can call the unit and asked to speak with the hospitalist  on call if the hospitalist that took care of you is not available. Once you are discharged, your primary care physician will handle any further medical issues. Please note that NO REFILLS for any discharge medications will be authorized once you are discharged, as it is imperative that you return to your primary care physician (or establish a relationship with a primary care physician if you do not have one) for your aftercare needs so that they can reassess your need for medications and monitor your lab values.     Today   Feels better, No other acute events overnight.  No dizziness, chest pain, shortness of breath. Will d/c home today.   VITAL SIGNS:  Blood pressure (!) 118/55, pulse 80, temperature 97.9 F (36.6 C), temperature source Oral, resp. rate 18, height 4\' 10"  (1.473 m), weight 78.4 kg (172 lb 14.4 oz), SpO2 98 %.  I/O:    Intake/Output Summary (Last 24 hours) at 05/01/2017 1234 Last data filed at 05/01/2017 0900 Gross per 24 hour  Intake 775 ml  Output -  Net 775 ml    PHYSICAL EXAMINATION:   GENERAL:  76 y.o.-year-old patient lying in bed in no acute distress.  EYES: Pupils equal, round, reactive to light and accommodation. No scleral icterus. Extraocular muscles intact.  HEENT: Head atraumatic, normocephalic. Oropharynx and nasopharynx clear.  NECK:  Supple, no jugular venous distention. No thyroid enlargement, no tenderness.  LUNGS: Normal breath sounds bilaterally, no wheezing, rales, rhonchi. No use of accessory muscles of respiration.  CARDIOVASCULAR: S1, S2 normal. No murmurs, rubs, or gallops.  ABDOMEN: Soft, nontender, nondistended. Bowel sounds present. No organomegaly or mass.  EXTREMITIES: No cyanosis, clubbing or edema b/l.    NEUROLOGIC: Cranial nerves II through XII are intact. No focal Motor or sensory deficits b/l.  Globally weak PSYCHIATRIC: The patient is alert and oriented x 3.   SKIN: No obvious rash, lesion, or ulcer.    DATA REVIEW:    CBC Recent Labs  Lab 04/30/17 0620  WBC 7.4  HGB 11.4*  HCT 34.9*  PLT 164    Chemistries  Recent Labs  Lab 04/30/17 0620  NA 137  K 3.8  CL 103  CO2 26  GLUCOSE 112*  BUN 18  CREATININE 0.87  CALCIUM 8.3*    Cardiac Enzymes Recent Labs  Lab 04/29/17 1539  TROPONINI <0.03    Microbiology Results  Results for orders placed or  performed during the hospital encounter of 04/29/17  Urine Culture     Status: Abnormal   Collection Time: 04/29/17  3:39 PM  Result Value Ref Range Status   Specimen Description URINE, RANDOM  Final   Special Requests NONE  Final   Culture MULTIPLE SPECIES PRESENT, SUGGEST RECOLLECTION (A)  Final   Report Status 05/01/2017 FINAL  Final    RADIOLOGY:  Dg Chest Portable 1 View  Result Date: 04/29/2017 CLINICAL DATA:  Acute onset weakness and near-syncope today. EXAM: PORTABLE CHEST 1 VIEW COMPARISON:  PA and lateral chest 07/01/2016. FINDINGS: The lungs are clear. Heart size is normal. No pneumothorax or pleural effusion. Aortic atherosclerosis noted. No acute bony abnormality. IMPRESSION: No acute disease. Atherosclerosis. Electronically Signed   By: Drusilla Kanner M.D.   On: 04/29/2017 16:15      Management plans discussed with the patient, family and they are in agreement.  CODE STATUS:     Code Status Orders  (From admission, onward)        Start     Ordered   04/29/17 2150  Full code  Continuous     04/29/17 2149    TOTAL TIME TAKING CARE OF THIS PATIENT: 40 minutes.    Houston Siren M.D on 05/01/2017 at 12:34 PM  Between 7am to 6pm - Pager - [phone]  After 6pm go to [email or web address] - Social research officer, government  [facility]  Office  [phone]  CC: Primary care physician; Barbette Reichmann, MD

## 2017-07-01 ENCOUNTER — Emergency Department: Payer: Medicare HMO

## 2017-07-01 ENCOUNTER — Emergency Department
Admission: EM | Admit: 2017-07-01 | Discharge: 2017-07-01 | Disposition: A | Discharge status: Home / Self Care | Payer: Medicare HMO | Attending: Emergency Medicine | Admitting: Emergency Medicine

## 2017-07-01 ENCOUNTER — Ambulatory Visit: Admission: RE | Admit: 2017-07-01 | Payer: Medicare Other | Source: Ambulatory Visit | Admitting: Gastroenterology

## 2017-07-01 ENCOUNTER — Encounter: Admission: RE | Payer: Self-pay | Source: Ambulatory Visit

## 2017-07-01 DIAGNOSIS — R441 Visual hallucinations: Secondary | ICD-10-CM | POA: Insufficient documentation

## 2017-07-01 DIAGNOSIS — I509 Heart failure, unspecified: Secondary | ICD-10-CM | POA: Diagnosis not present

## 2017-07-01 DIAGNOSIS — J449 Chronic obstructive pulmonary disease, unspecified: Secondary | ICD-10-CM | POA: Insufficient documentation

## 2017-07-01 DIAGNOSIS — I110 Hypertensive heart disease with heart failure: Secondary | ICD-10-CM | POA: Diagnosis not present

## 2017-07-01 DIAGNOSIS — Z7984 Long term (current) use of oral hypoglycemic drugs: Secondary | ICD-10-CM | POA: Diagnosis not present

## 2017-07-01 DIAGNOSIS — R0602 Shortness of breath: Secondary | ICD-10-CM | POA: Diagnosis not present

## 2017-07-01 DIAGNOSIS — Z733 Stress, not elsewhere classified: Secondary | ICD-10-CM | POA: Insufficient documentation

## 2017-07-01 DIAGNOSIS — J45909 Unspecified asthma, uncomplicated: Secondary | ICD-10-CM | POA: Insufficient documentation

## 2017-07-01 DIAGNOSIS — R410 Disorientation, unspecified: Secondary | ICD-10-CM | POA: Diagnosis not present

## 2017-07-01 DIAGNOSIS — R531 Weakness: Secondary | ICD-10-CM

## 2017-07-01 DIAGNOSIS — Z87891 Personal history of nicotine dependence: Secondary | ICD-10-CM | POA: Diagnosis not present

## 2017-07-01 DIAGNOSIS — Z7982 Long term (current) use of aspirin: Secondary | ICD-10-CM | POA: Insufficient documentation

## 2017-07-01 DIAGNOSIS — R05 Cough: Secondary | ICD-10-CM | POA: Insufficient documentation

## 2017-07-01 DIAGNOSIS — E119 Type 2 diabetes mellitus without complications: Secondary | ICD-10-CM | POA: Diagnosis not present

## 2017-07-01 LAB — COMPREHENSIVE METABOLIC PANEL
ALT: 16 U/L (ref 14–54)
ANION GAP: 7 (ref 5–15)
AST: 26 U/L (ref 15–41)
Albumin: 3.6 g/dL (ref 3.5–5.0)
Alkaline Phosphatase: 95 U/L (ref 38–126)
BILIRUBIN TOTAL: 0.9 mg/dL (ref 0.3–1.2)
BUN: 19 mg/dL (ref 6–20)
CO2: 25 mmol/L (ref 22–32)
Calcium: 8.7 mg/dL — ABNORMAL LOW (ref 8.9–10.3)
Chloride: 105 mmol/L (ref 101–111)
Creatinine, Ser: 0.94 mg/dL (ref 0.44–1.00)
GFR calc non Af Amer: 58 mL/min — ABNORMAL LOW (ref >60)
Glucose, Bld: 131 mg/dL — ABNORMAL HIGH (ref 65–99)
Potassium: 4 mmol/L (ref 3.5–5.1)
SODIUM: 137 mmol/L (ref 135–145)
TOTAL PROTEIN: 7.5 g/dL (ref 6.5–8.1)

## 2017-07-01 LAB — URINALYSIS, COMPLETE (UACMP) WITH MICROSCOPIC
BACTERIA UA: NONE SEEN
Bilirubin Urine: NEGATIVE
GLUCOSE, UA: NEGATIVE mg/dL
Hgb urine dipstick: NEGATIVE
Ketones, ur: NEGATIVE mg/dL
Leukocytes, UA: NEGATIVE
Nitrite: NEGATIVE
PROTEIN: NEGATIVE mg/dL
Specific Gravity, Urine: 1.009 (ref 1.005–1.030)
pH: 5 (ref 5.0–8.0)

## 2017-07-01 LAB — CBC
HCT: 36.1 % (ref 35.0–47.0)
HEMOGLOBIN: 11.7 g/dL — AB (ref 12.0–16.0)
MCH: 25.9 pg — ABNORMAL LOW (ref 26.0–34.0)
MCHC: 32.5 g/dL (ref 32.0–36.0)
MCV: 79.9 fL — ABNORMAL LOW (ref 80.0–100.0)
Platelets: 206 10*3/uL (ref 150–440)
RBC: 4.52 MIL/uL (ref 3.80–5.20)
RDW: 16.1 % — ABNORMAL HIGH (ref 11.5–14.5)
WBC: 7.7 10*3/uL (ref 3.6–11.0)

## 2017-07-01 LAB — BLOOD GAS, VENOUS
ACID-BASE EXCESS: 0.1 mmol/L (ref 0.0–2.0)
BICARBONATE: 25.4 mmol/L (ref 20.0–28.0)
O2 Saturation: 95.8 %
PCO2 VEN: 43 mmHg — AB (ref 44.0–60.0)
PO2 VEN: 82 mmHg — AB (ref 32.0–45.0)
Patient temperature: 37
pH, Ven: 7.38 (ref 7.250–7.430)

## 2017-07-01 LAB — INFLUENZA PANEL BY PCR (TYPE A & B)
INFLAPCR: NEGATIVE
INFLBPCR: NEGATIVE

## 2017-07-01 LAB — TROPONIN I: Troponin I: <0.03 ng/mL (ref <0.03)

## 2017-07-01 SURGERY — COLONOSCOPY WITH PROPOFOL
Anesthesia: General

## 2017-07-01 MED ORDER — SODIUM CHLORIDE 0.9 % IV BOLUS (SEPSIS)
500.0000 mL | Freq: Once | INTRAVENOUS | Status: AC
  Administered 2017-07-01: 500 mL via INTRAVENOUS

## 2017-07-01 MED ORDER — METHYLPREDNISOLONE SODIUM SUCC 125 MG IJ SOLR
125.0000 mg | Freq: Once | INTRAMUSCULAR | Status: AC
Start: 2017-07-01 — End: 2017-07-01
  Administered 2017-07-01: 125 mg via INTRAVENOUS
  Filled 2017-07-01: qty 2

## 2017-07-01 MED ORDER — PREDNISONE 20 MG PO TABS: 60.0000 mg | ORAL_TABLET | Freq: Every day | ORAL | 0 refills | Status: AC

## 2017-07-01 MED ORDER — IPRATROPIUM-ALBUTEROL 0.5-2.5 (3) MG/3ML IN SOLN
3.0000 mL | Freq: Once | RESPIRATORY_TRACT | Status: AC
  Administered 2017-07-01: 3 mL via RESPIRATORY_TRACT
  Filled 2017-07-01: qty 3

## 2017-07-01 NOTE — ED Notes (Signed)
Pt states"the scabs on her breast are from little animals standing on her to eat her eyelashes and eye brows."

## 2017-07-01 NOTE — Discharge Instructions (Signed)
Take the prednisone as prescribed starting tomorrow.  You should also use your inhaler and/or nebulizer every 4-6 hours for the next several days.  Return to the emergency department immediately for any new, worsening, recurrent confusion, generalized weakness, difficulty breathing, lightheadedness, fevers, or any other new or worsening symptoms that concern you.  You should also return if you feel unsafe at home at any time, or have any thoughts of wanting to hurt yourself or feelings of increased depression.  I have also provided a list of outpatient counseling centers for you to potentially follow up at if you feel that you would like mental health evaluation.  You should follow-up with your primary care doctor within the next week.

## 2017-07-01 NOTE — ED Provider Notes (Signed)
[facility] Emergency Department Provider Note ____________________________________________   First MD Initiated Contact with Patient 07/01/17 1347     (approximate)  I have reviewed the triage vital signs and the nursing notes.   HISTORY  Chief Complaint Altered Mental Status    HPI Kristen Watts is a 77 y.o. female with past medical history of COPD, CHF, and other PMH as noted below who presents with a primary complaint of increased generalized weakness over the last several days, gradual onset, constant in course, and she states precipitated by increased life stress recently.  Patient also reports dark stools and dark urine in the last 1-2 days, and increased shortness of breath and cough.  She states she has had increased conflict with a family member over the last 9 months and in particular in the last few weeks has had a high level of stress, so she feels like her body is shutting down due to the stress.  The patient denies specific symptoms of depression, and she denies any SI or HI at this time.  She does state that intermittently she has had some visual hallucinations of people being with her in her apartment, but these happen only occasionally, and patient has insight and understands that it is not real when it happens.  She denies hearing any voices, or any other acute psychiatric symptoms at this time.  She feels safe at home.   Past Medical History:  Diagnosis Date  . ADD (attention deficit disorder)   . Arthritis   . ASCVD (arteriosclerotic cardiovascular disease)   . Asthma   . Blood transfusion without reported diagnosis   . CHF (congestive heart failure) (HCC)   . COPD (chronic obstructive pulmonary disease) (HCC)   . Coronary artery disease   . Depression   . Diabetes mellitus without complication (HCC)   . Eczema   . GERD (gastroesophageal reflux disease)   . Hematuria   . Hyperlipidemia   . Hypertension   . Obesity   .  Osteoarthritis   . Panic attacks   . Phlebitis   . Pulmonary embolism (HCC)   . PVD (peripheral vascular disease) (HCC)   . Sleep apnea   . Stroke (HCC)   . Thyroid disease     Patient Active Problem List   Diagnosis Date Noted  . Acute lower UTI 04/29/2017  . Acute bronchitis 04/29/2017  . Varicose veins of bilateral lower extremities with pain 02/01/2017  . Diabetes (HCC) 08/20/2016  . Essential hypertension, benign 08/20/2016  . COPD (chronic obstructive pulmonary disease) (HCC) 08/20/2016  . Pain in limb 08/20/2016  . Swelling of limb 08/20/2016  . Syncope 07/01/2016    Past Surgical History:  Procedure Laterality Date  . ABDOMINAL HYSTERECTOMY    . BACK SURGERY    . BREAST BIOPSY Right    neg  . BREAST SURGERY    . CARDIAC SURGERY    . CHOLECYSTECTOMY    . ESOPHAGOGASTRODUODENOSCOPY (EGD) WITH PROPOFOL N/A 12/09/2016   Procedure: ESOPHAGOGASTRODUODENOSCOPY (EGD) WITH PROPOFOL;  Surgeon: Christena Deem, MD;  Location: [facility] ENDOSCOPY;  Service: Endoscopy;  Laterality: N/A;  . FRACTURE SURGERY    . TONSILLECTOMY      Prior to Admission medications   Medication Sig Start Date End Date Taking? Authorizing Provider  albuterol (PROVENTIL HFA;VENTOLIN HFA) 108 (90 Base) MCG/ACT inhaler Inhale 1-2 puffs into the lungs every 4 (four) hours as needed for wheezing or shortness of breath.   Yes [provider]  albuterol (PROVENTIL) (2.5 MG/3ML) 0.083% nebulizer solution Take 2.5 mg by nebulization every 4 (four) hours as needed for wheezing or shortness of breath.   Yes [provider]  AMITIZA 24 MCG capsule Take 1 capsule by mouth 2 (two) times daily. 03/31/17  Yes [provider]  aspirin EC 81 MG tablet Take 81 mg by mouth daily.   Yes [provider]  atomoxetine (STRATTERA) 40 MG capsule Take 40 mg by mouth daily.   Yes [provider]  carvedilol (COREG) 6.25 MG tablet Take 6.25 mg by mouth 2 (two) times daily with a meal.    Yes [provider]  Cholecalciferol 10000 units TABS Take by mouth daily.   Yes [provider]  cyanocobalamin 1000 MCG tablet Take by mouth daily.   Yes [provider]  DEXILANT 60 MG capsule Take 1 capsule by mouth daily. 03/05/16  Yes [provider]  diphenhydrAMINE (BENADRYL) 25 MG tablet Take 100 mg by mouth 2 (two) times daily.   Yes [provider]  furosemide (LASIX) 40 MG tablet TAKE ONE TABLET BY MOUTH EVERY DAY AS NEEDED FOR EDEMA 02/12/16  Yes [provider]  Iodoquinol-HC-Aloe Polysacch (ALCORTIN A) 1-2-1 % GEL Apply topically.   Yes [provider]  losartan (COZAAR) 50 MG tablet Take 50 mg by mouth daily. 03/31/17  Yes [provider]  metFORMIN (GLUCOPHAGE) 500 MG tablet TAKE ONE TABLET BY MOUTH EVERY MORNING WITH BREAKFAST 03/12/16  Yes [provider]  nitroGLYCERIN (NITROSTAT) 0.4 MG SL tablet Place 0.4 mg under the tongue every 5 (five) minutes as needed for chest pain.   Yes [provider]  SYNTHROID 150 MCG tablet Take 150 mcg by mouth every morning. 03/05/16  Yes [provider]  thiamine 100 MG tablet Take 100 mg by mouth daily.   Yes [provider]  vitamin E 1000 UNIT capsule Take 1,000 Units by mouth daily.   Yes [provider]  zinc gluconate 50 MG tablet Take 50 mg by mouth daily.   Yes [provider]  ibuprofen (ADVIL,MOTRIN) 600 MG tablet Take 1 tablet (600 mg total) by mouth every 8 (eight) hours as needed. Patient not taking: Reported on 07/01/2017 11/15/15   Tommi Rumps, PA-C  nystatin cream (MYCOSTATIN) Apply 1 application topically 2 (two) times daily. Patient not taking: Reported on 07/01/2017 11/06/15   Tommi Rumps, PA-C  predniSONE (DELTASONE) 20 MG tablet Take 3 tablets (60 mg total) by mouth daily for 4 days. 07/02/17 07/06/17  Dionne Bucy, MD    Allergies Clindamycin/lincomycin; Iodine; Stadol [butorphanol]; Darvon  [propoxyphene]; Demerol [meperidine]; and Latex  Family History  Problem Relation Age of Onset  . CAD Father   . Breast cancer Neg Hx     Social History Social History   Tobacco Use  . Smoking status: Former Smoker    Types: Cigarettes  . Smokeless tobacco: Never Used  Substance Use Topics  . Alcohol use: No  . Drug use: No    Review of Systems  Constitutional: No fever. Eyes: No visual changes. ENT: No sore throat. Cardiovascular: Denies chest pain. Respiratory: Positive for shortness of breath. Gastrointestinal: No nausea, no vomiting.  No diarrhea.  Genitourinary: Positive for dark urine.  Musculoskeletal: Positive for left-sided neck pain. Skin: Negative for rash. Neurological: Negative for headache.   ____________________________________________   PHYSICAL EXAM:  VITAL SIGNS: ED Triage Vitals  Enc Vitals Group     BP 07/01/17 1308 (!) 94/57  Pulse Rate 07/01/17 1308 72     Resp 07/01/17 1349 (!) 21     Temp 07/01/17 1322 (!) 97.4 F (36.3 C)     Temp Source 07/01/17 1322 Axillary     SpO2 07/01/17 1308 100 %     Weight 07/01/17 1316 180 lb (81.6 kg)     Height 07/01/17 1316 4\' 10"  (1.473 m)     Head Circumference --      Peak Flow --      Pain Score 07/01/17 1350 0     Pain Loc --      Pain Edu? --      Excl. in GC? --     Constitutional: Alert and oriented x4.  Relatively comfortable appearing and in no acute distress. Eyes: Conjunctivae are normal.  EOMI.  PERRLA. Head: Atraumatic. Nose: No congestion/rhinnorhea. Mouth/Throat: Mucous membranes are slightly dry.   Neck: Normal range of motion.  Cardiovascular: Normal rate, regular rhythm. Grossly normal heart sounds.  Good peripheral circulation. Respiratory: Normal respiratory effort.  No retractions.  Decreased breath sounds bilaterally with trace wheezes. Gastrointestinal: Soft and nontender. No distention.  Genitourinary: No flank tenderness. Musculoskeletal: Trace bilateral lower  extremity edema.  Extremities warm and well perfused.  Neurologic:  Normal speech and language.  Motor and sensory intact in all extremities.  Normal coordination with no ataxia.  No gross focal neurologic deficits are appreciated.  Skin:  Skin is warm and dry. No rash noted. Psychiatric: Mood and affect are normal. Speech and behavior are normal.  Patient somewhat circuitous with her history, but thought is organized, she is answering questions appropriately, and there is no evidence of delusion or response to internal stimuli.  ____________________________________________   LABS (all labs ordered are listed, but only abnormal results are displayed)  Labs Reviewed  COMPREHENSIVE METABOLIC PANEL - Abnormal; Notable for the following components:      Result Value   Glucose, Bld 131 (*)    Calcium 8.7 (*)    GFR calc non Af Amer 58 (*)    All other components within normal limits  CBC - Abnormal; Notable for the following components:   Hemoglobin 11.7 (*)    MCV 79.9 (*)    MCH 25.9 (*)    RDW 16.1 (*)    All other components within normal limits  BLOOD GAS, VENOUS - Abnormal; Notable for the following components:   pCO2, Ven 43 (*)    pO2, Ven 82.0 (*)    All other components within normal limits  URINALYSIS, COMPLETE (UACMP) WITH MICROSCOPIC - Abnormal; Notable for the following components:   Color, Urine YELLOW (*)    APPearance CLOUDY (*)    Squamous Epithelial / LPF 0-5 (*)    All other components within normal limits  TROPONIN I  INFLUENZA PANEL BY PCR (TYPE A & B)  CBG MONITORING, ED   ____________________________________________  EKG  ED ECG REPORT I, Dionne Bucy, the attending physician, personally viewed and interpreted this ECG.  Date: 07/01/2017 EKG Time: 1312 Rate: 67 Rhythm: normal sinus rhythm QRS Axis: normal Intervals: normal ST/T Wave abnormalities: Nonspecific T wave inversions/flattening inferiorly Narrative Interpretation: no evidence of  acute ischemia; no significant change when compared to EKG of 04/29/2017  ____________________________________________  RADIOLOGY  CXR: No focal infiltrate ____________________________________________   PROCEDURES  Procedure(s) performed: No  Procedures  Critical Care performed: No ____________________________________________   INITIAL IMPRESSION / ASSESSMENT AND PLAN / ED COURSE  Pertinent labs & imaging results that were available  during my care of the patient were reviewed by me and considered in my medical decision making (see chart for details).  77 year old female with past medical history as noted above presents primarily with generalized weakness, shortness of breath and cough, and dark urine and stool over the last several days, which she states is precipitated by increased stress.  The patient also reports a few episodes of visual hallucinations or thinking someone is in her apartment with her, but she demonstrates insight and knows that these were not real, and she denies any other acute psychiatric symptoms.  She is somewhat tangential and circuitous with history, but is answering all my questions appropriately, she is organizing her thoughts, and she has no evidence of AMS at this time.  I reviewed the past medical records in Epic; I saw this patient in November 2018 as a near syncope with hypotension, and was admitted.  No other recent visits or admissions pertinent to current presentation.  On exam, the vital signs are normal except for borderline hypotension, the patient has no respiratory distress, and the remainder the exam above.  Neuro exam is nonfocal.  Differential for the generalized weakness and other symptoms includes but is not limited to, COPD exacerbation, pneumonia or other infection  such as UTI, dehydration or other metabolic cause, cardiac etiology, endocrine cause such as hypothyroidism, or less likely primary psychiatric etiology.  Plan for workup for  the above differential.  At this time given that the patient has no focal neurologic findings or altered mental status, there is no indication for brain imaging.  Although she does describe symptoms consistent with some visual hallucination, given that she is appropriate, has no active psychiatric symptoms currently, and does not demonstrate any acute danger to self or others, there is no indication for emergent psychiatric evaluation but I will provide mental health referral if the patient is being discharged.   ----------------------------------------- 7:20 PM on 07/01/2017 -----------------------------------------  Patient's workup in the ED is reassuring.  Her troponin was negative, flu swab is negative, UA is not consistent with infection, and her chest x-ray also does not show any signs of pneumonia.  Her blood chemistries are also within normal limits, and the VBG did not reveal any significant hypercapnia.  Her vital signs are stable, and her O2 sat is in the mid 90s on room air.  On reassessment, the patient is alert, comfortable, and much more directed with answering questions.  She states she feels much better, and would like to go home.  On further discussion, she clarifies that in addition to the weakness she had felt somewhat confused over the last 2 days, but is now feeling much better.  She states that the hallucinations that she described previously were actually part of this.  She states that she thinks it might be due to her COPD and either a low oxygen or high carbon dioxide level that could have resolved, and I agree.  I had extensive discussion with her about her social and family situation and stressors, her mental health, as well as her ongoing medical care.  The patient states that she feels safe at home, and is in the process of getting a restraining order against the family member that has been causing her the stress.  She confirms that she is not feeling any thoughts of wanting  to hurt herself, and there are no other acute psychiatric symptoms.  There is no evidence of danger to self or others.  The patient understands that  she may return at any time if she feels unsafe, has any new physical or mental health symptoms, or other new concerns.  I gave her thorough return precautions, and she expressed understanding.  She also understands that she should take the prednisone as prescribed and to use albuterol at home.  She will follow-up with your primary care doctor.    ____________________________________________   FINAL CLINICAL IMPRESSION(S) / ED DIAGNOSES  Final diagnoses:  Chronic obstructive pulmonary disease, unspecified COPD type (HCC)  Generalized weakness      NEW MEDICATIONS STARTED DURING THIS VISIT:  New Prescriptions   PREDNISONE (DELTASONE) 20 MG TABLET    Take 3 tablets (60 mg total) by mouth daily for 4 days.     Note:  This document was prepared using Dragon voice recognition software and may include unintentional dictation errors.    Dionne Bucy, MD 07/01/17 [phone]

## 2017-07-01 NOTE — ED Triage Notes (Signed)
Pt presents today via [facility] for BP was up and that she was altered in thinking, as well as running door frames. Pt is A/Ox4. Pt is on 3L chronically.

## 2017-11-04 ENCOUNTER — Other Ambulatory Visit: Payer: Self-pay | Admitting: Internal Medicine

## 2017-11-04 DIAGNOSIS — Z1231 Encounter for screening mammogram for malignant neoplasm of breast: Secondary | ICD-10-CM

## 2017-12-19 ENCOUNTER — Other Ambulatory Visit: Payer: Self-pay

## 2017-12-19 ENCOUNTER — Emergency Department: Payer: Medicare HMO

## 2017-12-19 ENCOUNTER — Emergency Department
Admission: EM | Admit: 2017-12-19 | Discharge: 2017-12-19 | Disposition: A | Discharge status: Home / Self Care | Payer: Medicare HMO | Attending: Emergency Medicine | Admitting: Emergency Medicine

## 2017-12-19 DIAGNOSIS — J449 Chronic obstructive pulmonary disease, unspecified: Secondary | ICD-10-CM | POA: Diagnosis not present

## 2017-12-19 DIAGNOSIS — Z91040 Latex allergy status: Secondary | ICD-10-CM | POA: Diagnosis not present

## 2017-12-19 DIAGNOSIS — Z79899 Other long term (current) drug therapy: Secondary | ICD-10-CM | POA: Diagnosis not present

## 2017-12-19 DIAGNOSIS — M542 Cervicalgia: Secondary | ICD-10-CM | POA: Insufficient documentation

## 2017-12-19 DIAGNOSIS — I509 Heart failure, unspecified: Secondary | ICD-10-CM | POA: Diagnosis not present

## 2017-12-19 DIAGNOSIS — Z87891 Personal history of nicotine dependence: Secondary | ICD-10-CM | POA: Insufficient documentation

## 2017-12-19 DIAGNOSIS — I110 Hypertensive heart disease with heart failure: Secondary | ICD-10-CM | POA: Diagnosis not present

## 2017-12-19 DIAGNOSIS — E119 Type 2 diabetes mellitus without complications: Secondary | ICD-10-CM | POA: Insufficient documentation

## 2017-12-19 DIAGNOSIS — Z7984 Long term (current) use of oral hypoglycemic drugs: Secondary | ICD-10-CM | POA: Diagnosis not present

## 2017-12-19 DIAGNOSIS — G8929 Other chronic pain: Secondary | ICD-10-CM | POA: Insufficient documentation

## 2017-12-19 DIAGNOSIS — I2510 Atherosclerotic heart disease of native coronary artery without angina pectoris: Secondary | ICD-10-CM | POA: Diagnosis not present

## 2017-12-19 DIAGNOSIS — Z7982 Long term (current) use of aspirin: Secondary | ICD-10-CM | POA: Insufficient documentation

## 2017-12-19 MED ORDER — DIAZEPAM 2 MG PO TABS
2.0000 mg | ORAL_TABLET | Freq: Once | ORAL | Status: AC
  Administered 2017-12-19: 2 mg via ORAL
  Filled 2017-12-19: qty 1

## 2017-12-19 NOTE — ED Provider Notes (Signed)
[facility] Emergency Department Provider Note  ____________________________________________   First MD Initiated Contact with Patient 12/19/17 [phone]     (approximate)  I have reviewed the triage vital signs and the nursing notes.   HISTORY  Chief Complaint Dizziness   HPI Kristen Watts is a 77 y.o. female who comes to the emergency department via EMS with roughly 3 weeks of worsening of her chronic neck pain.  She said that this evening she tried to lean back and take a pill and noted that it was somewhat difficult to lean her head back which is not normal for her.  She has had multiple neck surgeries.  EMS was initially dispatched for a fall but the patient says she did not actually fall but felt like she might.  She denies chest pain or shortness of breath.  She denies abdominal pain nausea or vomiting.  She denies palpitations.   She denies syncope.   Past Medical History:  Diagnosis Date  . ADD (attention deficit disorder)   . Arthritis   . ASCVD (arteriosclerotic cardiovascular disease)   . Asthma   . Blood transfusion without reported diagnosis   . CHF (congestive heart failure) (HCC)   . COPD (chronic obstructive pulmonary disease) (HCC)   . Coronary artery disease   . Depression   . Diabetes mellitus without complication (HCC)   . Eczema   . GERD (gastroesophageal reflux disease)   . Hematuria   . Hyperlipidemia   . Hypertension   . Obesity   . Osteoarthritis   . Panic attacks   . Phlebitis   . Pulmonary embolism (HCC)   . PVD (peripheral vascular disease) (HCC)   . Sleep apnea   . Stroke (HCC)   . Thyroid disease     Patient Active Problem List   Diagnosis Date Noted  . Acute lower UTI 04/29/2017  . Acute bronchitis 04/29/2017  . Varicose veins of bilateral lower extremities with pain 02/01/2017  . Diabetes (HCC) 08/20/2016  . Essential hypertension, benign 08/20/2016  . COPD (chronic obstructive pulmonary disease) (HCC)  08/20/2016  . Pain in limb 08/20/2016  . Swelling of limb 08/20/2016  . Syncope 07/01/2016    Past Surgical History:  Procedure Laterality Date  . ABDOMINAL HYSTERECTOMY    . BACK SURGERY    . BREAST BIOPSY Right    neg  . BREAST SURGERY    . CARDIAC SURGERY    . CHOLECYSTECTOMY    . ESOPHAGOGASTRODUODENOSCOPY (EGD) WITH PROPOFOL N/A 12/09/2016   Procedure: ESOPHAGOGASTRODUODENOSCOPY (EGD) WITH PROPOFOL;  Surgeon: Christena Deem, MD;  Location: [facility] ENDOSCOPY;  Service: Endoscopy;  Laterality: N/A;  . FRACTURE SURGERY    . TONSILLECTOMY      Prior to Admission medications   Medication Sig Start Date End Date Taking? Authorizing Provider  albuterol (PROVENTIL HFA;VENTOLIN HFA) 108 (90 Base) MCG/ACT inhaler Inhale 1-2 puffs into the lungs every 4 (four) hours as needed for wheezing or shortness of breath.    [provider]  albuterol (PROVENTIL) (2.5 MG/3ML) 0.083% nebulizer solution Take 2.5 mg by nebulization every 4 (four) hours as needed for wheezing or shortness of breath.    [provider]  AMITIZA 24 MCG capsule Take 1 capsule by mouth 2 (two) times daily. 03/31/17   [provider]  aspirin EC 81 MG tablet Take 81 mg by mouth daily.    [provider]  atomoxetine (STRATTERA) 40 MG capsule Take 40 mg by mouth daily.  [provider]  carvedilol (COREG) 6.25 MG tablet Take 6.25 mg by mouth 2 (two) times daily with a meal.    [provider]  Cholecalciferol 10000 units TABS Take by mouth daily.    [provider]  cyanocobalamin 1000 MCG tablet Take by mouth daily.    [provider]  DEXILANT 60 MG capsule Take 1 capsule by mouth daily. 03/05/16   [provider]  diphenhydrAMINE (BENADRYL) 25 MG tablet Take 100 mg by mouth 2 (two) times daily.    [provider]  furosemide (LASIX) 40 MG tablet TAKE ONE TABLET BY MOUTH EVERY DAY AS NEEDED FOR EDEMA 02/12/16   [provider]    ibuprofen (ADVIL,MOTRIN) 600 MG tablet Take 1 tablet (600 mg total) by mouth every 8 (eight) hours as needed. Patient not taking: Reported on 07/01/2017 11/15/15   Tommi RumpsSummers, Rhonda L, PA-C  Iodoquinol-HC-Aloe Polysacch Lenard Lance(ALCORTIN A) 1-2-1 % GEL Apply topically.    [provider]  losartan (COZAAR) 50 MG tablet Take 50 mg by mouth daily. 03/31/17   [provider]  metFORMIN (GLUCOPHAGE) 500 MG tablet TAKE ONE TABLET BY MOUTH EVERY MORNING WITH BREAKFAST 03/12/16   [provider]  nitroGLYCERIN (NITROSTAT) 0.4 MG SL tablet Place 0.4 mg under the tongue every 5 (five) minutes as needed for chest pain.    [provider]  nystatin cream (MYCOSTATIN) Apply 1 application topically 2 (two) times daily. Patient not taking: Reported on 07/01/2017 11/06/15   Tommi RumpsSummers, Rhonda L, PA-C  SYNTHROID 150 MCG tablet Take 150 mcg by mouth every morning. 03/05/16   [provider]  thiamine 100 MG tablet Take 100 mg by mouth daily.    [provider]  vitamin E 1000 UNIT capsule Take 1,000 Units by mouth daily.    [provider]  zinc gluconate 50 MG tablet Take 50 mg by mouth daily.    [provider]    Allergies Clindamycin/lincomycin; Iodine; Stadol [butorphanol]; Darvon [propoxyphene]; Demerol [meperidine]; and Latex  Family History  Problem Relation Age of Onset  . CAD Father   . Breast cancer Neg Hx     Social History Social History   Tobacco Use  . Smoking status: Former Smoker    Types: Cigarettes  . Smokeless tobacco: Never Used  Substance Use Topics  . Alcohol use: No  . Drug use: No    Review of Systems Constitutional: No fever/chills Eyes: No visual changes. ENT: Positive for neck pain Cardiovascular: Denies chest pain. Respiratory: Denies shortness of breath. Gastrointestinal: No abdominal pain.  No nausea, no vomiting.  No diarrhea.  No constipation. Genitourinary: Negative for dysuria. Musculoskeletal: Negative  for back pain. Skin: Negative for rash. Neurological: Negative for headaches, focal weakness or numbness.   ____________________________________________   PHYSICAL EXAM:  VITAL SIGNS: ED Triage Vitals  Enc Vitals Group     BP      Pulse      Resp      Temp      Temp src      SpO2      Weight      Height      Head Circumference      Peak Flow      Pain Score      Pain Loc      Pain Edu?      Excl. in GC?     Constitutional: Alert and oriented x4 pleasant cooperative no diaphoresis Eyes: PERRL EOMI. Head: Atraumatic. Nose:  No congestion/rhinnorhea. Mouth/Throat: No trismus Neck: No stridor.  Full range of motion with no meningismus Cardiovascular: Normal rate, regular rhythm. Grossly normal heart sounds.  Good peripheral circulation. Respiratory: Normal respiratory effort.  No retractions. Lungs CTAB and moving good air Gastrointestinal: Soft nontender Musculoskeletal: No lower extremity edema   Neurologic:  Normal speech and language. No gross focal neurologic deficits are appreciated. Skin:  Skin is warm, dry and intact. No rash noted. Psychiatric: Mood and affect are normal. Speech and behavior are normal.    ____________________________________________   DIFFERENTIAL includes but not limited to  Syncope, near-syncope, cervical strain ____________________________________________   LABS (all labs ordered are listed, but only abnormal results are displayed)  Labs Reviewed - No data to display   __________________________________________  EKG  ED ECG REPORT I, Merrily Brittle, the attending physician, personally viewed and interpreted this ECG.  Date: 12/19/2017 EKG Time:  Rate: 81 Rhythm: normal sinus rhythm QRS Axis: normal Intervals: normal ST/T Wave abnormalities: T wave inversions 3 and aVF as well as V6 are consistent with old Narrative Interpretation: no evidence of acute  ischemia  ____________________________________________  RADIOLOGY  CT scan of the head neck reviewed by me with no acute disease ____________________________________________   PROCEDURES  Procedure(s) performed: no  Procedures  Critical Care performed: no  ____________________________________________   INITIAL IMPRESSION / ASSESSMENT AND PLAN / ED COURSE  Pertinent labs & imaging results that were available during my care of the patient were reviewed by me and considered in my medical decision making (see chart for details).   The patient arrives hemodynamically stable and quite well-appearing.  She states her symptoms have been present for about 3 weeks and nothing in particular about this evening prompted the call to 911 except that she could not sleep and she figured she might as well come to the hospital to get checked out.  She says she has a previous history of multiple neck surgeries and now has difficulty extending her neck and leaning back to swallow her pills.    ----------------------------------------- 4:54 AM on 12/19/2017 -----------------------------------------  Fortunately the patient's neuroimaging is negative for acute disease and she feels improved after 2 mg of oral Valium.  She will be discharged home with primary care follow-up and strict return precautions have been given.  ____________________________________________   FINAL CLINICAL IMPRESSION(S) / ED DIAGNOSES  Final diagnoses:  Chronic neck pain      NEW MEDICATIONS STARTED DURING THIS VISIT:  New Prescriptions   No medications on file     Note:  This document was prepared using Dragon voice recognition software and may include unintentional dictation errors.     Merrily Brittle, MD 12/19/17 [phone]

## 2017-12-19 NOTE — ED Notes (Signed)
Pt for discharge but will not have a ride until after 7 am. Pt had valium earlier. Will let her rest in bed until ride can come.

## 2017-12-19 NOTE — ED Notes (Signed)
Pt reports she has a knot on the back of her head to the left side that she noticed a few weeks ago. Pt reports the knot has moved down approx an inch since. Pt reports she has not been sleeping well due to the area feeling uncomfortable when she lays down on it.

## 2017-12-19 NOTE — Discharge Instructions (Signed)
It was a pleasure to take care of you today, and thank you for coming to our emergency department.  If you have any questions or concerns before leaving please ask the nurse to grab me and I'm more than happy to go through your aftercare instructions again.  If you were prescribed any opioid pain medication today such as Norco, Vicodin, Percocet, morphine, hydrocodone, or oxycodone please make sure you do not drive when you are taking this medication as it can alter your ability to drive safely.  If you have any concerns once you are home that you are not improving or are in fact getting worse before you can make it to your follow-up appointment, please do not hesitate to call 911 and come back for further evaluation.  Merrily Brittle, MD  Results for orders placed or performed during the hospital encounter of 07/01/17  Comprehensive metabolic panel  Result Value Ref Range   Sodium 137 135 - 145 mmol/L   Potassium 4.0 3.5 - 5.1 mmol/L   Chloride 105 101 - 111 mmol/L   CO2 25 22 - 32 mmol/L   Glucose, Bld 131 (H) 65 - 99 mg/dL   BUN 19 6 - 20 mg/dL   Creatinine, Ser 2.95 0.44 - 1.00 mg/dL   Calcium 8.7 (L) 8.9 - 10.3 mg/dL   Total Protein 7.5 6.5 - 8.1 g/dL   Albumin 3.6 3.5 - 5.0 g/dL   AST 26 15 - 41 U/L   ALT 16 14 - 54 U/L   Alkaline Phosphatase 95 38 - 126 U/L   Total Bilirubin 0.9 0.3 - 1.2 mg/dL   GFR calc non Af Amer 58 (L) >60 mL/min   GFR calc Af Amer >60 >60 mL/min   Anion gap 7 5 - 15  CBC  Result Value Ref Range   WBC 7.7 3.6 - 11.0 K/uL   RBC 4.52 3.80 - 5.20 MIL/uL   Hemoglobin 11.7 (L) 12.0 - 16.0 g/dL   HCT 62.1 30.8 - 65.7 %   MCV 79.9 (L) 80.0 - 100.0 fL   MCH 25.9 (L) 26.0 - 34.0 pg   MCHC 32.5 32.0 - 36.0 g/dL   RDW 84.6 (H) 96.2 - 95.2 %   Platelets 206 150 - 440 K/uL  Blood gas, venous  Result Value Ref Range   pH, Ven 7.38 7.250 - 7.430   pCO2, Ven 43 (L) 44.0 - 60.0 mmHg   pO2, Ven 82.0 (H) 32.0 - 45.0 mmHg   Bicarbonate 25.4 20.0 - 28.0 mmol/L   Acid-Base Excess 0.1 0.0 - 2.0 mmol/L   O2 Saturation 95.8 %   Patient temperature 37.0    Collection site VENOUS    Sample type VENOUS   Urinalysis, Complete w Microscopic  Result Value Ref Range   Color, Urine YELLOW (A) YELLOW   APPearance CLOUDY (A) CLEAR   Specific Gravity, Urine 1.009 1.005 - 1.030   pH 5.0 5.0 - 8.0   Glucose, UA NEGATIVE NEGATIVE mg/dL   Hgb urine dipstick NEGATIVE NEGATIVE   Bilirubin Urine NEGATIVE NEGATIVE   Ketones, ur NEGATIVE NEGATIVE mg/dL   Protein, ur NEGATIVE NEGATIVE mg/dL   Nitrite NEGATIVE NEGATIVE   Leukocytes, UA NEGATIVE NEGATIVE   RBC / HPF 0-5 0 - 5 RBC/hpf   WBC, UA 0-5 0 - 5 WBC/hpf   Bacteria, UA NONE SEEN NONE SEEN   Squamous Epithelial / LPF 0-5 (A) NONE SEEN  Troponin I  Result Value Ref Range   Troponin I <0.03 <  0.03 ng/mL  Influenza panel by PCR (type A & B)  Result Value Ref Range   Influenza A By PCR NEGATIVE NEGATIVE   Influenza B By PCR NEGATIVE NEGATIVE   Ct Head Wo Contrast  Result Date: 12/19/2017 CLINICAL DATA:  Patient fell like she was going to fall. Has a not to the back of the head from 3 weeks ago. EXAM: CT HEAD WITHOUT CONTRAST CT CERVICAL SPINE WITHOUT CONTRAST TECHNIQUE: Multidetector CT imaging of the head and cervical spine was performed following the standard protocol without intravenous contrast. Multiplanar CT image reconstructions of the cervical spine were also generated. COMPARISON:  09/26/2016 FINDINGS: CT HEAD FINDINGS Brain: No evidence of acute infarction, hemorrhage, hydrocephalus, extra-axial collection or mass lesion/mass effect. Diffuse cerebral atrophy. Mild ventricular dilatation consistent with central atrophy. Low-attenuation changes in the deep white matter consistent with small vessel ischemia. Vascular: Intracranial arterial vascular calcifications are present. Skull: Normal. Negative for fracture or focal lesion. Sinuses/Orbits: No acute finding. Other: None. CT CERVICAL SPINE FINDINGS  Alignment: Straightening of usual cervical lordosis. This is probably positional or postoperative. No anterior subluxation. Normal alignment of the facet joints. C1-2 articulation appears intact. Skull base and vertebrae: Skull base appears intact. No vertebral compression deformities. No focal bone lesion or bone destruction. Soft tissues and spinal canal: No prevertebral soft tissue swelling. No paraspinal soft tissue mass or infiltration. Disc levels: Postoperative changes with posterior laminectomies from C3 through C7. Fusion of C5 through C7. This could be congenital, postoperative, or degenerative. Severe degenerative changes throughout the cervical spine with narrowed interspaces and endplate hypertrophic changes. Degenerative changes in the facet joints. Upper chest: Vascular calcification.  Lung apices are clear. Other: None. IMPRESSION: 1. No acute intracranial abnormalities. Chronic atrophy and small vessel ischemic changes. 2. Postoperative changes with posterior laminectomies from C3 through C7. Vertebral body fusion from C5-C7 anteriorly. Diffuse degenerative change. No acute displaced fractures identified in the cervical spine. Electronically Signed   By: Burman Nieves M.D.   On: 12/19/2017 04:18   Ct Cervical Spine Wo Contrast  Result Date: 12/19/2017 CLINICAL DATA:  Patient fell like she was going to fall. Has a not to the back of the head from 3 weeks ago. EXAM: CT HEAD WITHOUT CONTRAST CT CERVICAL SPINE WITHOUT CONTRAST TECHNIQUE: Multidetector CT imaging of the head and cervical spine was performed following the standard protocol without intravenous contrast. Multiplanar CT image reconstructions of the cervical spine were also generated. COMPARISON:  09/26/2016 FINDINGS: CT HEAD FINDINGS Brain: No evidence of acute infarction, hemorrhage, hydrocephalus, extra-axial collection or mass lesion/mass effect. Diffuse cerebral atrophy. Mild ventricular dilatation consistent with central atrophy.  Low-attenuation changes in the deep white matter consistent with small vessel ischemia. Vascular: Intracranial arterial vascular calcifications are present. Skull: Normal. Negative for fracture or focal lesion. Sinuses/Orbits: No acute finding. Other: None. CT CERVICAL SPINE FINDINGS Alignment: Straightening of usual cervical lordosis. This is probably positional or postoperative. No anterior subluxation. Normal alignment of the facet joints. C1-2 articulation appears intact. Skull base and vertebrae: Skull base appears intact. No vertebral compression deformities. No focal bone lesion or bone destruction. Soft tissues and spinal canal: No prevertebral soft tissue swelling. No paraspinal soft tissue mass or infiltration. Disc levels: Postoperative changes with posterior laminectomies from C3 through C7. Fusion of C5 through C7. This could be congenital, postoperative, or degenerative. Severe degenerative changes throughout the cervical spine with narrowed interspaces and endplate hypertrophic changes. Degenerative changes in the facet joints. Upper chest: Vascular calcification.  Lung apices  are clear. Other: None. IMPRESSION: 1. No acute intracranial abnormalities. Chronic atrophy and small vessel ischemic changes. 2. Postoperative changes with posterior laminectomies from C3 through C7. Vertebral body fusion from C5-C7 anteriorly. Diffuse degenerative change. No acute displaced fractures identified in the cervical spine. Electronically Signed   By: Burman NievesWilliam  Stevens M.D.   On: 12/19/2017 04:18

## 2017-12-19 NOTE — ED Triage Notes (Signed)
Patient to Rm 25 via wheelchair by EMS from home.  Per EMS original call was for a fall, on their arrival to patient's home patient did not actually fall but felt like she was going to fall.  Patient reports a knot to back of her head for the past 3 weeks and it has moved downward.  Dr. Lamont Snowballifenbark at bedside.

## 2018-04-30 IMAGING — DX DG CHEST 1V PORT
1 series · 1 of 1 positions shown · non-contrast
Comparison: PA and lateral chest 07/01/2016.

CLINICAL DATA: Acute onset weakness and near-syncope today.

EXAM:
PORTABLE CHEST 1 VIEW

[chest ap]
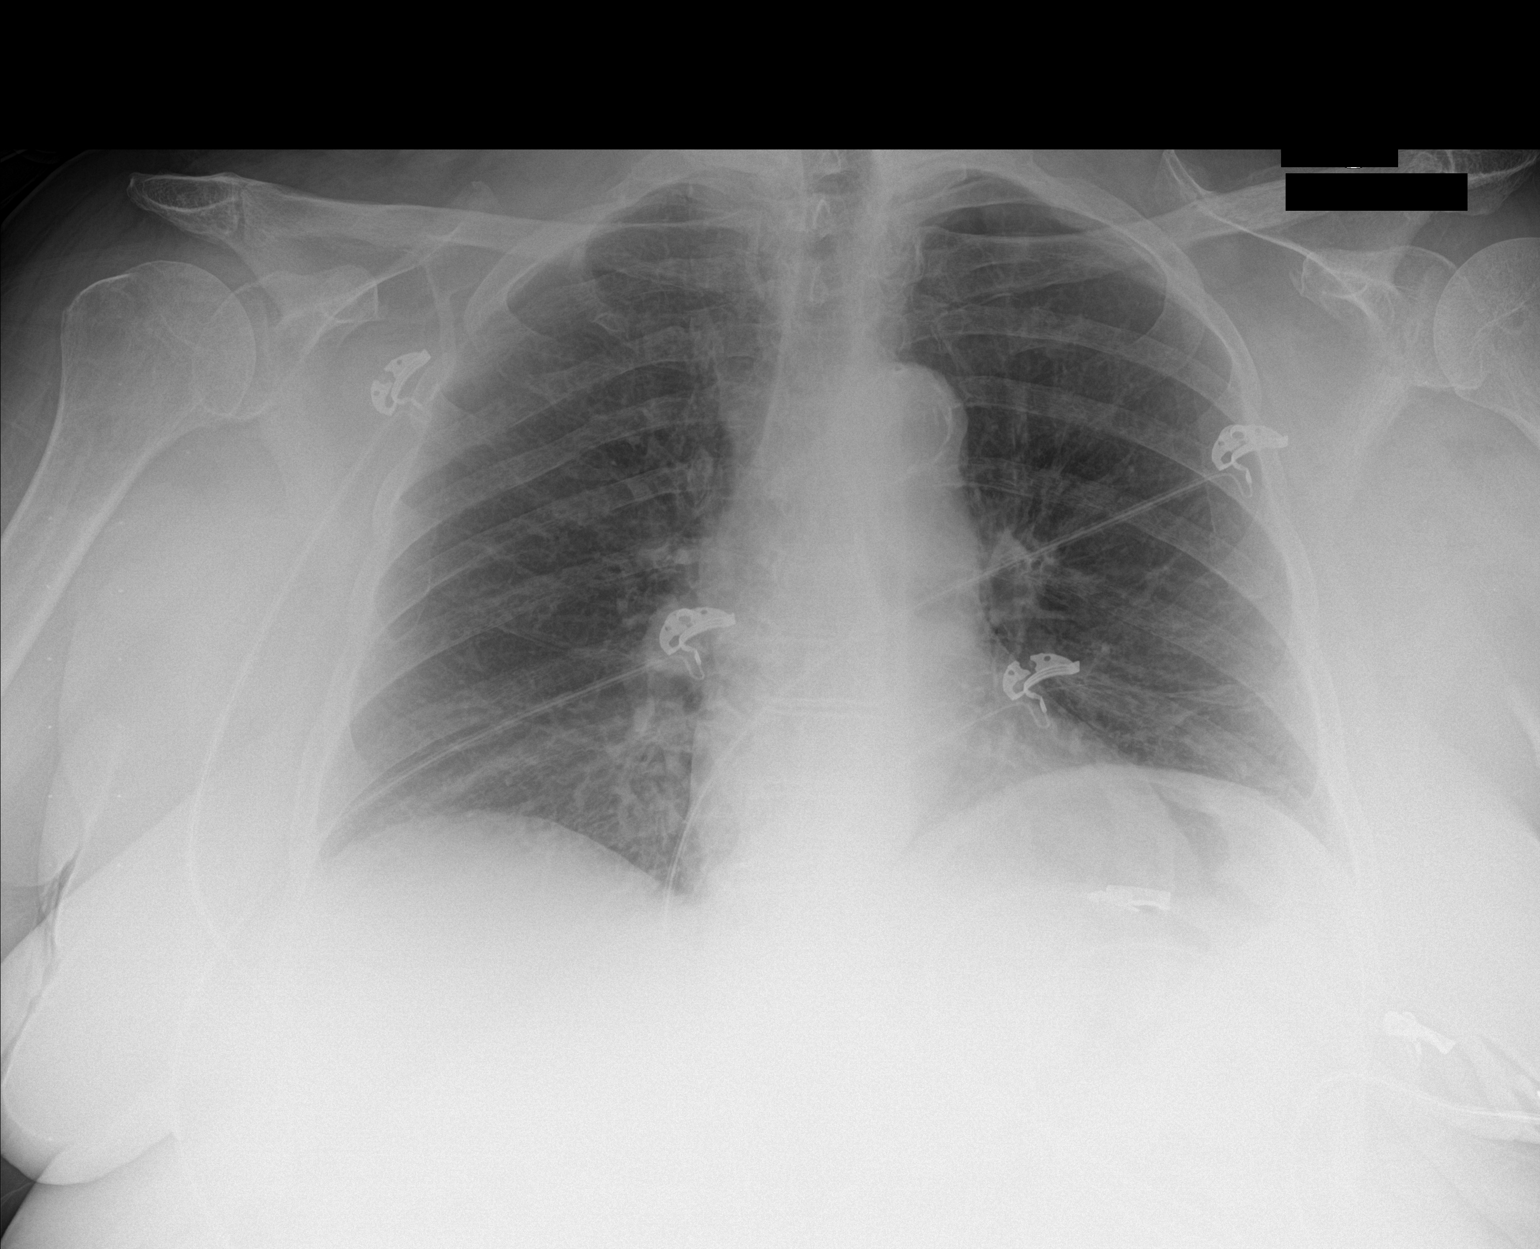

[1 of 1 positions shown; findings below may reference images not displayed]

FINDINGS: The lungs are clear. Heart size is normal. No pneumothorax or
pleural effusion. Aortic atherosclerosis noted. No acute bony
abnormality.
IMPRESSION: No acute disease.

Atherosclerosis.

## 2018-07-05 ENCOUNTER — Emergency Department
Admission: EM | Admit: 2018-07-05 | Discharge: 2018-07-05 | Disposition: A | Discharge status: Home / Self Care | Payer: Medicare HMO | Attending: Emergency Medicine | Admitting: Emergency Medicine

## 2018-07-05 ENCOUNTER — Other Ambulatory Visit: Payer: Self-pay

## 2018-07-05 ENCOUNTER — Emergency Department: Payer: Medicare HMO

## 2018-07-05 DIAGNOSIS — Y999 Unspecified external cause status: Secondary | ICD-10-CM | POA: Insufficient documentation

## 2018-07-05 DIAGNOSIS — W19XXXA Unspecified fall, initial encounter: Secondary | ICD-10-CM | POA: Insufficient documentation

## 2018-07-05 DIAGNOSIS — R443 Hallucinations, unspecified: Secondary | ICD-10-CM | POA: Diagnosis not present

## 2018-07-05 DIAGNOSIS — Y92008 Other place in unspecified non-institutional (private) residence as the place of occurrence of the external cause: Secondary | ICD-10-CM | POA: Diagnosis not present

## 2018-07-05 DIAGNOSIS — Z87891 Personal history of nicotine dependence: Secondary | ICD-10-CM | POA: Diagnosis not present

## 2018-07-05 DIAGNOSIS — I110 Hypertensive heart disease with heart failure: Secondary | ICD-10-CM | POA: Diagnosis not present

## 2018-07-05 DIAGNOSIS — Y939 Activity, unspecified: Secondary | ICD-10-CM | POA: Insufficient documentation

## 2018-07-05 DIAGNOSIS — I509 Heart failure, unspecified: Secondary | ICD-10-CM | POA: Diagnosis not present

## 2018-07-05 DIAGNOSIS — S0990XA Unspecified injury of head, initial encounter: Secondary | ICD-10-CM | POA: Diagnosis present

## 2018-07-05 DIAGNOSIS — J449 Chronic obstructive pulmonary disease, unspecified: Secondary | ICD-10-CM | POA: Diagnosis not present

## 2018-07-05 DIAGNOSIS — Z91040 Latex allergy status: Secondary | ICD-10-CM | POA: Insufficient documentation

## 2018-07-05 DIAGNOSIS — S0101XA Laceration without foreign body of scalp, initial encounter: Secondary | ICD-10-CM | POA: Diagnosis not present

## 2018-07-05 DIAGNOSIS — E119 Type 2 diabetes mellitus without complications: Secondary | ICD-10-CM | POA: Diagnosis not present

## 2018-07-05 LAB — COMPREHENSIVE METABOLIC PANEL
ALK PHOS: 87 U/L (ref 38–126)
ALT: 23 U/L (ref 0–44)
AST: 32 U/L (ref 15–41)
Albumin: 4.1 g/dL (ref 3.5–5.0)
Anion gap: 10 (ref 5–15)
BUN: 23 mg/dL (ref 8–23)
CALCIUM: 8.6 mg/dL — AB (ref 8.9–10.3)
CHLORIDE: 101 mmol/L (ref 98–111)
CO2: 25 mmol/L (ref 22–32)
CREATININE: 0.76 mg/dL (ref 0.44–1.00)
GFR calc non Af Amer: >60 mL/min (ref >60)
GLUCOSE: 106 mg/dL — AB (ref 70–99)
Potassium: 3.9 mmol/L (ref 3.5–5.1)
SODIUM: 136 mmol/L (ref 135–145)
Total Bilirubin: 1.2 mg/dL (ref 0.3–1.2)
Total Protein: 7.3 g/dL (ref 6.5–8.1)

## 2018-07-05 LAB — URINALYSIS, COMPLETE (UACMP) WITH MICROSCOPIC
BACTERIA UA: NONE SEEN
Bilirubin Urine: NEGATIVE
GLUCOSE, UA: NEGATIVE mg/dL
Ketones, ur: NEGATIVE mg/dL
Leukocytes, UA: NEGATIVE
Nitrite: NEGATIVE
PROTEIN: NEGATIVE mg/dL
Specific Gravity, Urine: 1.006 (ref 1.005–1.030)
pH: 6 (ref 5.0–8.0)

## 2018-07-05 LAB — BLOOD GAS, VENOUS
Acid-Base Excess: 5.4 mmol/L — ABNORMAL HIGH (ref 0.0–2.0)
Bicarbonate: 29.9 mmol/L — ABNORMAL HIGH (ref 20.0–28.0)
O2 Saturation: 97.8 %
PCO2 VEN: 42 mmHg — AB (ref 44.0–60.0)
PH VEN: 7.46 — AB (ref 7.250–7.430)
PO2 VEN: 95 mmHg — AB (ref 32.0–45.0)
Patient temperature: 37

## 2018-07-05 LAB — CBC WITH DIFFERENTIAL/PLATELET
ABS IMMATURE GRANULOCYTES: 0.02 10*3/uL (ref 0.00–0.07)
Basophils Absolute: 0 10*3/uL (ref 0.0–0.1)
Basophils Relative: 1 %
Eosinophils Absolute: 0.3 10*3/uL (ref 0.0–0.5)
Eosinophils Relative: 4 %
HCT: 34.6 % — ABNORMAL LOW (ref 36.0–46.0)
HEMOGLOBIN: 9.9 g/dL — AB (ref 12.0–15.0)
IMMATURE GRANULOCYTES: 0 %
Lymphocytes Relative: 19 %
Lymphs Abs: 1.5 10*3/uL (ref 0.7–4.0)
MCH: 23.2 pg — AB (ref 26.0–34.0)
MCHC: 28.6 g/dL — ABNORMAL LOW (ref 30.0–36.0)
MCV: 81.2 fL (ref 80.0–100.0)
MONO ABS: 0.6 10*3/uL (ref 0.1–1.0)
Monocytes Relative: 8 %
NEUTROS ABS: 5.4 10*3/uL (ref 1.7–7.7)
NEUTROS PCT: 68 %
Platelets: 173 10*3/uL (ref 150–400)
RBC: 4.26 MIL/uL (ref 3.87–5.11)
RDW: 16 % — ABNORMAL HIGH (ref 11.5–15.5)
WBC: 7.9 10*3/uL (ref 4.0–10.5)
nRBC: 0 % (ref 0.0–0.2)

## 2018-07-05 LAB — URINE DRUG SCREEN, QUALITATIVE (ARMC ONLY)
AMPHETAMINES, UR SCREEN: NOT DETECTED
BENZODIAZEPINE, UR SCRN: NOT DETECTED
Barbiturates, Ur Screen: NOT DETECTED
Cannabinoid 50 Ng, Ur ~~LOC~~: NOT DETECTED
Cocaine Metabolite,Ur ~~LOC~~: NOT DETECTED
MDMA (Ecstasy)Ur Screen: NOT DETECTED
METHADONE SCREEN, URINE: NOT DETECTED
OPIATE, UR SCREEN: NOT DETECTED
Phencyclidine (PCP) Ur S: NOT DETECTED
TRICYCLIC, UR SCREEN: NOT DETECTED

## 2018-07-05 LAB — ETHANOL

## 2018-07-05 LAB — TROPONIN I: Troponin I: <0.03 ng/mL (ref <0.03)

## 2018-07-05 NOTE — ED Notes (Signed)
Assisted pt to the restroom via wheelchair. Pt returned to bed and requests to sit in wheelchair.

## 2018-07-05 NOTE — ED Provider Notes (Signed)
[facility] Emergency Department Provider Note       Time seen: ----------------------------------------- 1:39 PM on 07/05/2018 -----------------------------------------   I have reviewed the triage vital signs and the nursing notes.  HISTORY   Chief Complaint Fall    HPI Kristen Watts is a 78 y.o. female with a history of asthma, CHF, COPD, depression, diabetes, hyperlipidemia, hypertension, panic attacks, PE who presents to the ED for a fall.  Patient arrives by EMS from home complaining of a fall.  She reports last night she fell and went to bed.  Later on she noticed there was blood on the kitchen floor.  She wears 5 L of oxygen chronically.  Patient states she keeps her hair up in a bun to keep creatures from eating her hair and eyelashes.  She reports these creatures live under her flooring.  Past Medical History:  Diagnosis Date  . ADD (attention deficit disorder)   . Arthritis   . ASCVD (arteriosclerotic cardiovascular disease)   . Asthma   . Blood transfusion without reported diagnosis   . CHF (congestive heart failure) (HCC)   . COPD (chronic obstructive pulmonary disease) (HCC)   . Coronary artery disease   . Depression   . Diabetes mellitus without complication (HCC)   . Eczema   . GERD (gastroesophageal reflux disease)   . Hematuria   . Hyperlipidemia   . Hypertension   . Obesity   . Osteoarthritis   . Panic attacks   . Phlebitis   . Pulmonary embolism (HCC)   . PVD (peripheral vascular disease) (HCC)   . Sleep apnea   . Stroke (HCC)   . Thyroid disease     Patient Active Problem List   Diagnosis Date Noted  . Acute lower UTI 04/29/2017  . Acute bronchitis 04/29/2017  . Varicose veins of bilateral lower extremities with pain 02/01/2017  . Diabetes (HCC) 08/20/2016  . Essential hypertension, benign 08/20/2016  . COPD (chronic obstructive pulmonary disease) (HCC) 08/20/2016  . Pain in limb 08/20/2016  . Swelling of limb  08/20/2016  . Syncope 07/01/2016    Past Surgical History:  Procedure Laterality Date  . ABDOMINAL HYSTERECTOMY    . BACK SURGERY    . BREAST BIOPSY Right    neg  . BREAST SURGERY    . CARDIAC SURGERY    . CHOLECYSTECTOMY    . ESOPHAGOGASTRODUODENOSCOPY (EGD) WITH PROPOFOL N/A 12/09/2016   Procedure: ESOPHAGOGASTRODUODENOSCOPY (EGD) WITH PROPOFOL;  Surgeon: Christena DeemSkulskie, Martin U, MD;  [facility];  Service: Endoscopy;  Laterality: N/A;  . FRACTURE SURGERY    . TONSILLECTOMY      Allergies Clindamycin/lincomycin; Iodine; Stadol [butorphanol]; Darvon [propoxyphene]; Demerol [meperidine]; and Latex  Social History Social History   Tobacco Use  . Smoking status: Former Smoker    Types: Cigarettes  . Smokeless tobacco: Never Used  Substance Use Topics  . Alcohol use: No  . Drug use: No   Review of Systems Constitutional: Negative for fever. Cardiovascular: Negative for chest pain. Respiratory: Negative for shortness of breath. Gastrointestinal: Negative for abdominal pain, vomiting and diarrhea. Musculoskeletal: Negative for back pain. Skin: Positive for scalp laceration Neurological: Positive for headache Psychiatric: Positive for hallucinations  All systems negative/normal/unremarkable except as stated in the HPI  ____________________________________________   PHYSICAL EXAM:  VITAL SIGNS: ED Triage Vitals  Enc Vitals Group     BP 07/05/18 1253 (!) 134/43     Pulse Rate 07/05/18 1253 88     Resp 07/05/18 1253  19     Temp 07/05/18 1253 97.9 F (36.6 C)     Temp src --      SpO2 07/05/18 1253 99 %     Weight 07/05/18 1256 178 lb 14.1 oz (81.1 kg)     Height 07/05/18 1256 4\' 10"  (1.473 m)     Head Circumference --      Peak Flow --      Pain Score 07/05/18 1255 0     Pain Loc --      Pain Edu? --      Excl. in GC? --    Constitutional: Alert and oriented. Well appearing and in no distress. Eyes: Conjunctivae are normal. Normal extraocular  movements. ENT      Head: Normocephalic, right parietal scalp laceration is noted      Nose: No congestion/rhinnorhea.      Mouth/Throat: Mucous membranes are moist.      Neck: No stridor. Cardiovascular: Normal rate, regular rhythm. No murmurs, rubs, or gallops. Respiratory: Normal respiratory effort without tachypnea nor retractions. Breath sounds are clear and equal bilaterally. No wheezes/rales/rhonchi. Gastrointestinal: Soft and nontender. Normal bowel sounds Musculoskeletal: Nontender with normal range of motion in extremities. No lower extremity tenderness nor edema. Neurologic:  Normal speech and language. No gross focal neurologic deficits are appreciated.  Skin:  Skin is warm, dry and intact. No rash noted. Psychiatric: Mood and affect are normal.  Bizarre behavior at times describing interactions with fictional creatures  ____________________________________________  ED COURSE:  As part of my medical decision making, I reviewed the following data within the electronic MEDICAL RECORD NUMBER History obtained from family if available, nursing notes, old chart and ekg, as well as notes from prior ED visits. Patient presented for fall with head injury, we will assess with labs and imaging as indicated at this time.   Marland Kitchen.Laceration Repair Date/Time: 07/05/2018 1:42 PM Performed by: Emily Filbert, MD Authorized by: Emily Filbert, MD   Consent:    Consent obtained:  Verbal   Consent given by:  Patient   Alternatives discussed:  No treatment Anesthesia (see MAR for exact dosages):    Anesthesia method:  None Laceration details:    Location:  Scalp   Scalp location:  R parietal   Length (cm):  2.5   Depth (mm):  4 Repair type:    Repair type:  Simple Treatment:    Area cleansed with:  Soap and water   Irrigation solution:  Tap water Skin repair:    Repair method:  Staples   Number of staples:  2 Approximation:    Approximation:  Close Post-procedure details:     Dressing:  Open (no dressing)   Patient tolerance of procedure:  Tolerated well, no immediate complications   ____________________________________________   LABS (pertinent positives/negatives)  Labs Reviewed  CBC WITH DIFFERENTIAL/PLATELET - Abnormal; Notable for the following components:      Result Value   Hemoglobin 9.9 (*)    HCT 34.6 (*)    MCH 23.2 (*)    MCHC 28.6 (*)    RDW 16.0 (*)    All other components within normal limits  COMPREHENSIVE METABOLIC PANEL - Abnormal; Notable for the following components:   Glucose, Bld 106 (*)    Calcium 8.6 (*)    All other components within normal limits  URINALYSIS, COMPLETE (UACMP) WITH MICROSCOPIC - Abnormal; Notable for the following components:   Color, Urine STRAW (*)    APPearance CLEAR (*)  Hgb urine dipstick SMALL (*)    All other components within normal limits  BLOOD GAS, VENOUS - Abnormal; Notable for the following components:   pH, Ven 7.46 (*)    pCO2, Ven 42 (*)    pO2, Ven 95.0 (*)    Bicarbonate 29.9 (*)    Acid-Base Excess 5.4 (*)    All other components within normal limits  TROPONIN I  ETHANOL  URINE DRUG SCREEN, QUALITATIVE ([facility] ONLY)  CBG MONITORING, ED    RADIOLOGY Images were viewed by me  CT head IMPRESSION: 1. No acute intracranial or calvarial findings. Right parietal scalp soft tissue injury. 2. Stable atrophy and chronic small vessel ischemic changes. ____________________________________________   DIFFERENTIAL DIAGNOSIS   Fall, head injury, scalp laceration, psychosis, hypercarbia, renal failure, overdose  FINAL ASSESSMENT AND PLAN  Fall, head injury, scalp laceration, hallucinations   Plan: The patient had presented for fall with head injury. Patient's labs did reveal a slight worsening in her anemia compared to prior. Patient's imaging did not reveal any acute process.  I did speak with her emergency contact who reiterated that this fascination or hallucination with creatures  eating her hair is a longstanding problem that her doctors know about.  No acute emergency medical condition was discovered, wound was repaired.  Follow-up in 10 days for staple removal   Ulice Dash, MD    Note: This note was generated in part or whole with voice recognition software. Voice recognition is usually quite accurate but there are transcription errors that can and very often do occur. I apologize for any typographical errors that were not detected and corrected.     Emily Filbert, MD 07/05/18 [id number]

## 2018-07-05 NOTE — ED Notes (Signed)
Report given to Tom RN.

## 2018-07-05 NOTE — ED Notes (Signed)
Patient transported to CT 

## 2018-07-05 NOTE — ED Notes (Signed)
Called [facility] for transport  [phone]

## 2018-07-05 NOTE — ED Triage Notes (Signed)
Pt comes via [facility] from home with c/o fall. EMS reports that pt stated she fell last night and went to bed. Later this am she noticed blood on her kitchen floor.  Pt reports no new pain. Pt wears 5L of O2 chronically.  Pt is alert and oriented. VSS

## 2018-07-05 NOTE — ED Notes (Signed)
Pt back from CT

## 2018-07-05 NOTE — ED Notes (Signed)
Pt stuck multiple times. Able to get blood, No IV access at this time.

## 2018-07-05 NOTE — ED Notes (Signed)
MD Williams at bedside.  

## 2018-07-13 ENCOUNTER — Ambulatory Visit
Admission: RE | Admit: 2018-07-13 | Discharge: 2018-07-13 | Disposition: A | Discharge status: Home / Self Care | Payer: Medicare HMO | Source: Ambulatory Visit | Attending: Internal Medicine | Admitting: Internal Medicine

## 2018-07-13 ENCOUNTER — Other Ambulatory Visit: Payer: Self-pay | Admitting: Internal Medicine

## 2018-07-13 DIAGNOSIS — R600 Localized edema: Secondary | ICD-10-CM | POA: Diagnosis not present

## 2018-07-31 ENCOUNTER — Emergency Department: Payer: Medicare HMO

## 2018-07-31 ENCOUNTER — Encounter: Payer: Self-pay | Admitting: *Deleted

## 2018-07-31 ENCOUNTER — Emergency Department
Admission: EM | Admit: 2018-07-31 | Discharge: 2018-08-01 | Disposition: A | Discharge status: Home / Self Care | Payer: Medicare HMO | Attending: Emergency Medicine | Admitting: Emergency Medicine

## 2018-07-31 ENCOUNTER — Other Ambulatory Visit: Payer: Self-pay

## 2018-07-31 DIAGNOSIS — Z7982 Long term (current) use of aspirin: Secondary | ICD-10-CM | POA: Insufficient documentation

## 2018-07-31 DIAGNOSIS — Z79899 Other long term (current) drug therapy: Secondary | ICD-10-CM | POA: Insufficient documentation

## 2018-07-31 DIAGNOSIS — Z8673 Personal history of transient ischemic attack (TIA), and cerebral infarction without residual deficits: Secondary | ICD-10-CM | POA: Diagnosis not present

## 2018-07-31 DIAGNOSIS — I509 Heart failure, unspecified: Secondary | ICD-10-CM | POA: Diagnosis not present

## 2018-07-31 DIAGNOSIS — Z87891 Personal history of nicotine dependence: Secondary | ICD-10-CM | POA: Diagnosis not present

## 2018-07-31 DIAGNOSIS — J449 Chronic obstructive pulmonary disease, unspecified: Secondary | ICD-10-CM | POA: Insufficient documentation

## 2018-07-31 DIAGNOSIS — J45909 Unspecified asthma, uncomplicated: Secondary | ICD-10-CM | POA: Insufficient documentation

## 2018-07-31 DIAGNOSIS — Z91040 Latex allergy status: Secondary | ICD-10-CM | POA: Insufficient documentation

## 2018-07-31 DIAGNOSIS — Z7984 Long term (current) use of oral hypoglycemic drugs: Secondary | ICD-10-CM | POA: Insufficient documentation

## 2018-07-31 DIAGNOSIS — E119 Type 2 diabetes mellitus without complications: Secondary | ICD-10-CM | POA: Insufficient documentation

## 2018-07-31 DIAGNOSIS — R2241 Localized swelling, mass and lump, right lower limb: Secondary | ICD-10-CM | POA: Diagnosis present

## 2018-07-31 DIAGNOSIS — I2510 Atherosclerotic heart disease of native coronary artery without angina pectoris: Secondary | ICD-10-CM | POA: Diagnosis not present

## 2018-07-31 DIAGNOSIS — I110 Hypertensive heart disease with heart failure: Secondary | ICD-10-CM | POA: Diagnosis not present

## 2018-07-31 DIAGNOSIS — L03115 Cellulitis of right lower limb: Secondary | ICD-10-CM | POA: Insufficient documentation

## 2018-07-31 LAB — CBC WITH DIFFERENTIAL/PLATELET
Abs Immature Granulocytes: 0.02 10*3/uL (ref 0.00–0.07)
BASOS ABS: 0.1 10*3/uL (ref 0.0–0.1)
Basophils Relative: 1 %
EOS ABS: 0.4 10*3/uL (ref 0.0–0.5)
Eosinophils Relative: 5 %
HEMATOCRIT: 32.1 % — AB (ref 36.0–46.0)
Hemoglobin: 9.5 g/dL — ABNORMAL LOW (ref 12.0–15.0)
IMMATURE GRANULOCYTES: 0 %
LYMPHS ABS: 1.5 10*3/uL (ref 0.7–4.0)
Lymphocytes Relative: 20 %
MCH: 23.3 pg — ABNORMAL LOW (ref 26.0–34.0)
MCHC: 29.6 g/dL — AB (ref 30.0–36.0)
MCV: 78.7 fL — ABNORMAL LOW (ref 80.0–100.0)
Monocytes Absolute: 0.7 10*3/uL (ref 0.1–1.0)
Monocytes Relative: 9 %
NEUTROS PCT: 65 %
NRBC: 0 % (ref 0.0–0.2)
Neutro Abs: 4.9 10*3/uL (ref 1.7–7.7)
PLATELETS: 204 10*3/uL (ref 150–400)
RBC: 4.08 MIL/uL (ref 3.87–5.11)
RDW: 15.9 % — ABNORMAL HIGH (ref 11.5–15.5)
WBC: 7.5 10*3/uL (ref 4.0–10.5)

## 2018-07-31 LAB — BASIC METABOLIC PANEL
ANION GAP: 10 (ref 5–15)
BUN: 22 mg/dL (ref 8–23)
CHLORIDE: 105 mmol/L (ref 98–111)
CO2: 23 mmol/L (ref 22–32)
Calcium: 8.9 mg/dL (ref 8.9–10.3)
Creatinine, Ser: 0.75 mg/dL (ref 0.44–1.00)
GFR calc non Af Amer: >60 mL/min (ref >60)
Glucose, Bld: 128 mg/dL — ABNORMAL HIGH (ref 70–99)
Potassium: 3.9 mmol/L (ref 3.5–5.1)
SODIUM: 138 mmol/L (ref 135–145)

## 2018-07-31 MED ORDER — IPRATROPIUM-ALBUTEROL 0.5-2.5 (3) MG/3ML IN SOLN
RESPIRATORY_TRACT | Status: AC
  Filled 2018-07-31: qty 3

## 2018-07-31 NOTE — ED Notes (Signed)
Report  swelling to bilateral lower extremitates x 3 weeks, now right leg also has redness and is hot to touch.

## 2018-07-31 NOTE — ED Triage Notes (Signed)
Pt is here for bilateral lower leg pain and swelling.  Pt has swelling and redness to lower legs.  Marked redness in left lower leg.  Leg is warm to touch.  Pt denies any trauma.  Pt denies any fever or chills.

## 2018-07-31 NOTE — ED Provider Notes (Signed)
[facility] Emergency Department Provider Note ____________________________________________   First MD Initiated Contact with Patient 07/31/18 1912     (approximate)  I have reviewed the triage vital signs and the nursing notes.   HISTORY  Chief Complaint Recurrent Skin Infections (lower leg redness and swelling)    HPI Kristen Watts is a 78 y.o. female with PMH as noted below including COPD on home O2 who presents with right lower extremity swelling over the last 2 weeks, gradual onset, worsening course, and not improved with Keflex she has been taking over the last several days.  She states that her home nurse told her to go to the doctor's office.  After being evaluated by the primary doctor she was sent to the ED.  Past Medical History:  Diagnosis Date  . ADD (attention deficit disorder)   . Arthritis   . ASCVD (arteriosclerotic cardiovascular disease)   . Asthma   . Blood transfusion without reported diagnosis   . CHF (congestive heart failure) (HCC)   . COPD (chronic obstructive pulmonary disease) (HCC)   . Coronary artery disease   . Depression   . Diabetes mellitus without complication (HCC)   . Eczema   . GERD (gastroesophageal reflux disease)   . Hematuria   . Hyperlipidemia   . Hypertension   . Obesity   . Osteoarthritis   . Panic attacks   . Phlebitis   . Pulmonary embolism (HCC)   . PVD (peripheral vascular disease) (HCC)   . Sleep apnea   . Stroke (HCC)   . Thyroid disease     Patient Active Problem List   Diagnosis Date Noted  . Acute lower UTI 04/29/2017  . Acute bronchitis 04/29/2017  . Varicose veins of bilateral lower extremities with pain 02/01/2017  . Diabetes (HCC) 08/20/2016  . Essential hypertension, benign 08/20/2016  . COPD (chronic obstructive pulmonary disease) (HCC) 08/20/2016  . Pain in limb 08/20/2016  . Swelling of limb 08/20/2016  . Syncope 07/01/2016    Past Surgical History:  Procedure  Laterality Date  . ABDOMINAL HYSTERECTOMY    . BACK SURGERY    . BREAST BIOPSY Right    neg  . BREAST SURGERY    . CARDIAC SURGERY    . CHOLECYSTECTOMY    . ESOPHAGOGASTRODUODENOSCOPY (EGD) WITH PROPOFOL N/A 12/09/2016   Procedure: ESOPHAGOGASTRODUODENOSCOPY (EGD) WITH PROPOFOL;  Surgeon: Christena Deem, MD;  Location: [facility] ENDOSCOPY;  Service: Endoscopy;  Laterality: N/A;  . FRACTURE SURGERY    . TONSILLECTOMY      Prior to Admission medications   Medication Sig Start Date End Date Taking? Authorizing Provider  ALPRAZolam Prudy Feeler) 0.25 MG tablet Take 0.25 mg by mouth at bedtime as needed for sleep. 06/19/18  Yes [provider]  cephALEXin (KEFLEX) 500 MG capsule Take 500 mg by mouth 3 (three) times daily. 07/24/18 08/03/18 Yes [provider]  clotrimazole-betamethasone (LOTRISONE) cream Apply 1 application topically 2 (two) times daily. 02/28/17  Yes [provider]  cyclobenzaprine (FLEXERIL) 5 MG tablet Take 5 mg by mouth 2 (two) times daily as needed for muscle spasms. 01/18/17  Yes [provider]  doxycycline (VIBRAMYCIN) 100 MG capsule Take 100 mg by mouth 2 (two) times daily. 07/31/18 08/10/18 Yes [provider]  ferrous sulfate 325 (65 FE) MG tablet Take 325 mg by mouth daily with breakfast. 07/13/18 07/13/19 Yes [provider]  Fluticasone-Salmeterol (ADVAIR DISKUS) 250-50 MCG/DOSE AEPB Inhale 1 puff into the lungs 2 (two) times daily.  08/04/17  Yes [provider]  hydrocortisone 2.5 % cream Apply 1 application topically 2 (two) times daily. 07/07/17  Yes [provider]  ipratropium-albuterol (DUONEB) 0.5-2.5 (3) MG/3ML SOLN Inhale 3 mLs into the lungs 4 (four) times daily. 03/22/18  Yes [provider]  isosorbide mononitrate (IMDUR) 60 MG 24 hr tablet Take 60 mg by mouth daily. 07/17/18  Yes [provider]  nystatin (MYCOSTATIN/NYSTOP) powder Apply 1 application topically 2 (two) times daily.  03/06/18  Yes [provider]  omega-3 acid ethyl esters (LOVAZA) 1 g capsule Take 1 g by mouth 2 (two) times daily. 12/13/16  Yes [provider]  rOPINIRole (REQUIP) 0.5 MG tablet Take 0.5 mg by mouth at bedtime. 10/26/17  Yes [provider]  albuterol (PROVENTIL HFA;VENTOLIN HFA) 108 (90 Base) MCG/ACT inhaler Inhale 1-2 puffs into the lungs every 4 (four) hours as needed for wheezing or shortness of breath.    [provider]  albuterol (PROVENTIL) (2.5 MG/3ML) 0.083% nebulizer solution Take 2.5 mg by nebulization every 4 (four) hours as needed for wheezing or shortness of breath.    [provider]  AMITIZA 24 MCG capsule Take 1 capsule by mouth 2 (two) times daily. 03/31/17   [provider]  aspirin EC 81 MG tablet Take 81 mg by mouth daily.    [provider]  atomoxetine (STRATTERA) 40 MG capsule Take 40 mg by mouth daily.    [provider]  carvedilol (COREG) 6.25 MG tablet Take 6.25 mg by mouth 2 (two) times daily with a meal.    [provider]  celecoxib (CELEBREX) 200 MG capsule Take 200 mg by mouth daily. 07/19/18   [provider]  Cholecalciferol 10000 units TABS Take by mouth daily.    [provider]  citalopram (CELEXA) 10 MG tablet Take 10 mg by mouth daily.    [provider]  cyanocobalamin 1000 MCG tablet Take by mouth daily.    [provider]  DEXILANT 60 MG capsule Take 1 capsule by mouth daily. 03/05/16   [provider]  diphenhydrAMINE (BENADRYL) 25 MG tablet Take 100 mg by mouth 2 (two) times daily.    [provider]  furosemide (LASIX) 40 MG tablet TAKE ONE TABLET BY MOUTH EVERY DAY AS NEEDED FOR EDEMA 02/12/16   [provider]  ibuprofen (ADVIL,MOTRIN) 600 MG tablet Take 1 tablet (600 mg total) by mouth every 8 (eight) hours as needed. Patient not taking: Reported on 07/01/2017 11/15/15   Tommi Rumps, PA-C  Iodoquinol-HC-Aloe  Polysacch Lenard Lance A) 1-2-1 % GEL Apply topically.    [provider]  losartan (COZAAR) 50 MG tablet Take 50 mg by mouth daily. 03/31/17   [provider]  metFORMIN (GLUCOPHAGE) 500 MG tablet TAKE ONE TABLET BY MOUTH EVERY MORNING WITH BREAKFAST 03/12/16   [provider]  mupirocin ointment (BACTROBAN) 2 % Apply 1 application topically 3 (three) times daily. 07/17/18   [provider]  nitroGLYCERIN (NITROSTAT) 0.4 MG SL tablet Place 0.4 mg under the tongue every 5 (five) minutes as needed for chest pain.    [provider]  nystatin cream (MYCOSTATIN) Apply 1 application topically 2 (two) times daily. Patient not taking: Reported on 07/01/2017 11/06/15   Tommi Rumps, PA-C  Potassium 99 MG TABS Take 99 mg by mouth daily.    [provider]  risperiDONE (RISPERDAL) 1 MG tablet Take 1 mg by mouth 2 (two) times daily.  [provider]  SYNTHROID 150 MCG tablet Take 150 mcg by mouth every morning. 03/05/16   [provider]  thiamine 100 MG tablet Take 100 mg by mouth daily.    [provider]  vitamin E 1000 UNIT capsule Take 1,000 Units by mouth daily.    [provider]  zinc gluconate 50 MG tablet Take 50 mg by mouth daily.    [provider]    Allergies Clindamycin/lincomycin; Iodine; Stadol [butorphanol]; Darvon [propoxyphene]; Demerol [meperidine]; and Latex  Family History  Problem Relation Age of Onset  . CAD Father   . Breast cancer Neg Hx     Social History Social History   Tobacco Use  . Smoking status: Former Smoker    Types: Cigarettes  . Smokeless tobacco: Never Used  Substance Use Topics  . Alcohol use: No  . Drug use: No    Review of Systems  Constitutional: Positive for chills. Eyes: No redness. ENT: No sore throat. Cardiovascular: Denies chest pain. Respiratory: Denies shortness of breath. Gastrointestinal: No vomiting. Genitourinary: Negative for  dysuria.  Musculoskeletal: Negative for back pain. Skin: Positive for rash. Neurological: Negative for headache.   ____________________________________________   PHYSICAL EXAM:  VITAL SIGNS: ED Triage Vitals  Enc Vitals Group     BP 07/31/18 1626 (!) 140/49     Pulse Rate 07/31/18 1626 83     Resp 07/31/18 1626 18     Temp 07/31/18 1626 98.5 F (36.9 C)     Temp Source 07/31/18 1626 Oral     SpO2 07/31/18 1626 97 %     Weight 07/31/18 1627 165 lb (74.8 kg)     Height 07/31/18 1627 4\' 10"  (1.473 m)     Head Circumference --      Peak Flow --      Pain Score 07/31/18 1949 0     Pain Loc --      Pain Edu? --      Excl. in GC? --     Constitutional: Alert and oriented.  Relatively comfortable appearing and in no acute distress. Eyes: Conjunctivae are normal.  Head: Atraumatic. Nose: No congestion/rhinnorhea. Mouth/Throat: Mucous membranes are moist.   Neck: Normal range of motion.  Cardiovascular: Good peripheral circulation. Respiratory: Normal respiratory effort.  Gastrointestinal: No distention.  Musculoskeletal: Extremities warm and well perfused.  Right lower extremity with erythema, induration, and warmth to lateral calf and lower leg with no erythema or streaking above the knee.  Trace swelling and redness to the left lower extremity near the ankle.  2+ DP pulses bilaterally.  Full range of motion of bilateral knees and Neurologic:  Normal speech and language. No gross focal neurologic deficits are appreciated.  Skin:  Skin is warm and dry.  Right lower extremity rash as above. Psychiatric: Mood and affect are normal. Speech and behavior are normal.  ____________________________________________   LABS (all labs ordered are listed, but only abnormal results are displayed)  Labs Reviewed  BASIC METABOLIC PANEL - Abnormal; Notable for the following components:      Result Value   Glucose, Bld 128 (*)    All other components within normal limits  CBC WITH  DIFFERENTIAL/PLATELET - Abnormal; Notable for the following components:   Hemoglobin 9.5 (*)    HCT 32.1 (*)    MCV 78.7 (*)    MCH 23.3 (*)    MCHC 29.6 (*)    RDW 15.9 (*)    All other components within normal limits  ____________________________________________  EKG   ____________________________________________  RADIOLOGY  [place] lower extremity bilateral: Pending  ____________________________________________   PROCEDURES  Procedure(s) performed: No  Procedures  Critical Care performed: No ____________________________________________   INITIAL IMPRESSION / ASSESSMENT AND PLAN / ED COURSE  Pertinent labs & imaging results that were available during my care of the patient were reviewed by me and considered in my medical decision making (see chart for details).  78 year old female with PMH as noted above including history of COPD on home O2 presents with worsening right lower extremity swelling over the last 2 weeks.  She went to her primary care office today and was sent to the emergency department.  The patient is a relatively poor historian and is somewhat unclear as to the exact reason she was sent to the emergency department.  I reviewed the past medical records in epic.  It appears that the patient was placed on Keflex for presumed cellulitis on 07/24/2018.  I cannot find the actual MD assessment from today, but patient instructions state as follows:  -If ultrasound is positive we will start blood thinners.  -Take Doxycycline in addition to the Keflex.  -Increase Lasix to twice daily until recheck. -Keep legs elevated as much as possible.    Therefore it appears that the patient was sent to the ED for ultrasound to rule out DVT.  Overall presentation is most consistent with cellulitis.  Lab work-up obtained from triage is reassuring.  The patient has chronic anemia but no elevated WBC count today.  We will obtain ultrasound to rule out acute DVT.  If this is  negative, as per the plan from the PMD, the patient will add doxycycline for additional coverage for cellulitis.  ----------------------------------------- 9:05 PM on 07/31/2018 -----------------------------------------  Ultrasound is pending.  Plan will be discharged home if it is negative for DVT.  I confirmed that the patient has an active prescription for doxycycline and again verbally gave her instructions to continue the Keflex and start taking the doxycycline.  I signed the patient out to the oncoming physician Dr. Sharma Covert.  ____________________________________________   FINAL CLINICAL IMPRESSION(S) / ED DIAGNOSES  Final diagnoses:  Cellulitis of right lower extremity      NEW MEDICATIONS STARTED DURING THIS VISIT:  New Prescriptions   No medications on file     Note:  This document was prepared using Dragon voice recognition software and may include unintentional dictation errors.    Dionne Bucy, MD 07/31/18 2106

## 2018-07-31 NOTE — Discharge Instructions (Signed)
Continue taking the cephalexin and finish the full course.  Take the doxycycline that was prescribed by your doctor today and finish the full course of this as well.  Keep a close eye on your leg.  You should return to the emergency department immediately if you feel like the pain, redness, or swelling are getting worse despite the antibiotics, or if you develop fever, pain or red streaking going up the leg, or any other new or worsening symptoms that concern you.  These could be signs that you may need to get admitted for IV antibiotics.

## 2018-07-31 NOTE — ED Notes (Signed)
Patient back from u/s vss. Awaiting reults and further plan of care.

## 2018-07-31 NOTE — ED Notes (Signed)
Urine collected and sent to lab Lm edt

## 2018-08-01 DIAGNOSIS — L03115 Cellulitis of right lower limb: Secondary | ICD-10-CM | POA: Diagnosis not present

## 2018-08-01 MED ORDER — IPRATROPIUM-ALBUTEROL 0.5-2.5 (3) MG/3ML IN SOLN
3.0000 mL | Freq: Once | RESPIRATORY_TRACT | Status: AC
  Administered 2018-08-01: 3 mL via RESPIRATORY_TRACT

## 2018-08-01 NOTE — ED Notes (Signed)
Pt ia anxious and does not want to lay down or sit up. Pt says a breathing treatment would make her feel better. Pt states she cant breathe due to the mucus in her throat. O2 sats are 98%

## 2018-08-01 NOTE — ED Notes (Signed)
Pt up to the bedside commode with assistance.

## 2018-08-01 NOTE — ED Notes (Signed)
Pt states she is going to take a cab home and she is able to go without her oxygen between here and home.

## 2018-08-01 NOTE — ED Notes (Signed)
Pt allowed to sit on the side of the bed for her breathing treatment

## 2018-08-07 ENCOUNTER — Other Ambulatory Visit: Payer: Self-pay

## 2018-08-07 ENCOUNTER — Emergency Department: Payer: Medicare HMO

## 2018-08-07 ENCOUNTER — Inpatient Hospital Stay
Admission: EM | Admit: 2018-08-07 | Discharge: 2018-08-10 | DRG: 602 | Disposition: A | Discharge status: Home Health | Payer: Medicare HMO | Attending: Internal Medicine | Admitting: Internal Medicine

## 2018-08-07 DIAGNOSIS — I2510 Atherosclerotic heart disease of native coronary artery without angina pectoris: Secondary | ICD-10-CM | POA: Diagnosis present

## 2018-08-07 DIAGNOSIS — J441 Chronic obstructive pulmonary disease with (acute) exacerbation: Secondary | ICD-10-CM | POA: Diagnosis present

## 2018-08-07 DIAGNOSIS — Z79890 Hormone replacement therapy (postmenopausal): Secondary | ICD-10-CM

## 2018-08-07 DIAGNOSIS — Z91040 Latex allergy status: Secondary | ICD-10-CM | POA: Diagnosis not present

## 2018-08-07 DIAGNOSIS — J9811 Atelectasis: Secondary | ICD-10-CM | POA: Diagnosis present

## 2018-08-07 DIAGNOSIS — Z86711 Personal history of pulmonary embolism: Secondary | ICD-10-CM

## 2018-08-07 DIAGNOSIS — Z7951 Long term (current) use of inhaled steroids: Secondary | ICD-10-CM

## 2018-08-07 DIAGNOSIS — I5033 Acute on chronic diastolic (congestive) heart failure: Secondary | ICD-10-CM | POA: Diagnosis present

## 2018-08-07 DIAGNOSIS — Z8672 Personal history of thrombophlebitis: Secondary | ICD-10-CM

## 2018-08-07 DIAGNOSIS — J9611 Chronic respiratory failure with hypoxia: Secondary | ICD-10-CM | POA: Diagnosis present

## 2018-08-07 DIAGNOSIS — G4730 Sleep apnea, unspecified: Secondary | ICD-10-CM | POA: Diagnosis present

## 2018-08-07 DIAGNOSIS — Z888 Allergy status to other drugs, medicaments and biological substances status: Secondary | ICD-10-CM

## 2018-08-07 DIAGNOSIS — I110 Hypertensive heart disease with heart failure: Secondary | ICD-10-CM | POA: Diagnosis present

## 2018-08-07 DIAGNOSIS — Z8249 Family history of ischemic heart disease and other diseases of the circulatory system: Secondary | ICD-10-CM

## 2018-08-07 DIAGNOSIS — Z881 Allergy status to other antibiotic agents status: Secondary | ICD-10-CM | POA: Diagnosis not present

## 2018-08-07 DIAGNOSIS — Z7982 Long term (current) use of aspirin: Secondary | ICD-10-CM | POA: Diagnosis not present

## 2018-08-07 DIAGNOSIS — Z885 Allergy status to narcotic agent status: Secondary | ICD-10-CM

## 2018-08-07 DIAGNOSIS — E1151 Type 2 diabetes mellitus with diabetic peripheral angiopathy without gangrene: Secondary | ICD-10-CM | POA: Diagnosis present

## 2018-08-07 DIAGNOSIS — K219 Gastro-esophageal reflux disease without esophagitis: Secondary | ICD-10-CM | POA: Diagnosis present

## 2018-08-07 DIAGNOSIS — D638 Anemia in other chronic diseases classified elsewhere: Secondary | ICD-10-CM | POA: Diagnosis present

## 2018-08-07 DIAGNOSIS — Z9981 Dependence on supplemental oxygen: Secondary | ICD-10-CM

## 2018-08-07 DIAGNOSIS — Z91041 Radiographic dye allergy status: Secondary | ICD-10-CM

## 2018-08-07 DIAGNOSIS — Z8673 Personal history of transient ischemic attack (TIA), and cerebral infarction without residual deficits: Secondary | ICD-10-CM

## 2018-08-07 DIAGNOSIS — L03116 Cellulitis of left lower limb: Secondary | ICD-10-CM | POA: Diagnosis present

## 2018-08-07 DIAGNOSIS — Z7984 Long term (current) use of oral hypoglycemic drugs: Secondary | ICD-10-CM

## 2018-08-07 DIAGNOSIS — Z23 Encounter for immunization: Secondary | ICD-10-CM | POA: Diagnosis present

## 2018-08-07 DIAGNOSIS — Z79899 Other long term (current) drug therapy: Secondary | ICD-10-CM

## 2018-08-07 DIAGNOSIS — F329 Major depressive disorder, single episode, unspecified: Secondary | ICD-10-CM | POA: Diagnosis present

## 2018-08-07 DIAGNOSIS — Z87891 Personal history of nicotine dependence: Secondary | ICD-10-CM

## 2018-08-07 DIAGNOSIS — L0390 Cellulitis, unspecified: Secondary | ICD-10-CM

## 2018-08-07 DIAGNOSIS — F988 Other specified behavioral and emotional disorders with onset usually occurring in childhood and adolescence: Secondary | ICD-10-CM | POA: Diagnosis present

## 2018-08-07 DIAGNOSIS — L03115 Cellulitis of right lower limb: Principal | ICD-10-CM | POA: Diagnosis present

## 2018-08-07 DIAGNOSIS — R609 Edema, unspecified: Secondary | ICD-10-CM | POA: Diagnosis present

## 2018-08-07 DIAGNOSIS — Z90710 Acquired absence of both cervix and uterus: Secondary | ICD-10-CM

## 2018-08-07 DIAGNOSIS — Z791 Long term (current) use of non-steroidal anti-inflammatories (NSAID): Secondary | ICD-10-CM | POA: Diagnosis not present

## 2018-08-07 DIAGNOSIS — J9621 Acute and chronic respiratory failure with hypoxia: Secondary | ICD-10-CM | POA: Diagnosis present

## 2018-08-07 LAB — BASIC METABOLIC PANEL
Anion gap: 9 (ref 5–15)
BUN: 18 mg/dL (ref 8–23)
CO2: 25 mmol/L (ref 22–32)
Calcium: 8.8 mg/dL — ABNORMAL LOW (ref 8.9–10.3)
Chloride: 104 mmol/L (ref 98–111)
Creatinine, Ser: 0.71 mg/dL (ref 0.44–1.00)
GFR calc Af Amer: >60 mL/min (ref >60)
GFR calc non Af Amer: >60 mL/min (ref >60)
Glucose, Bld: 147 mg/dL — ABNORMAL HIGH (ref 70–99)
Potassium: 3.9 mmol/L (ref 3.5–5.1)
Sodium: 138 mmol/L (ref 135–145)

## 2018-08-07 LAB — CBC
HCT: 34.8 % — ABNORMAL LOW (ref 36.0–46.0)
Hemoglobin: 10.1 g/dL — ABNORMAL LOW (ref 12.0–15.0)
MCH: 22.9 pg — ABNORMAL LOW (ref 26.0–34.0)
MCHC: 29 g/dL — ABNORMAL LOW (ref 30.0–36.0)
MCV: 78.9 fL — AB (ref 80.0–100.0)
Platelets: 194 10*3/uL (ref 150–400)
RBC: 4.41 MIL/uL (ref 3.87–5.11)
RDW: 15.8 % — ABNORMAL HIGH (ref 11.5–15.5)
WBC: 6.5 10*3/uL (ref 4.0–10.5)
nRBC: 0 % (ref 0.0–0.2)

## 2018-08-07 LAB — TROPONIN I: Troponin I: <0.03 ng/mL (ref <0.03)

## 2018-08-07 LAB — MAGNESIUM: Magnesium: 2.4 mg/dL (ref 1.7–2.4)

## 2018-08-07 LAB — GLUCOSE, CAPILLARY: Glucose-Capillary: 107 mg/dL — ABNORMAL HIGH (ref 70–99)

## 2018-08-07 MED ORDER — CYCLOBENZAPRINE HCL 10 MG PO TABS: 5.0000 mg | ORAL_TABLET | Freq: Two times a day (BID) | ORAL | Status: DC | PRN

## 2018-08-07 MED ORDER — IPRATROPIUM-ALBUTEROL 0.5-2.5 (3) MG/3ML IN SOLN
3.0000 mL | Freq: Once | RESPIRATORY_TRACT | Status: AC
  Administered 2018-08-07: 3 mL via RESPIRATORY_TRACT

## 2018-08-07 MED ORDER — SENNOSIDES-DOCUSATE SODIUM 8.6-50 MG PO TABS: 1.0000 | ORAL_TABLET | Freq: Every evening | ORAL | Status: DC | PRN

## 2018-08-07 MED ORDER — ACETAMINOPHEN 325 MG PO TABS: 650.0000 mg | ORAL_TABLET | Freq: Four times a day (QID) | ORAL | Status: DC | PRN

## 2018-08-07 MED ORDER — CITALOPRAM HYDROBROMIDE 10 MG PO TABS
10.0000 mg | ORAL_TABLET | Freq: Every day | ORAL | Status: DC
  Administered 2018-08-08 – 2018-08-10 (×3): 10 mg via ORAL
  Filled 2018-08-07 (×3): qty 1

## 2018-08-07 MED ORDER — ONDANSETRON HCL 4 MG PO TABS: 4.0000 mg | ORAL_TABLET | Freq: Four times a day (QID) | ORAL | Status: DC | PRN

## 2018-08-07 MED ORDER — LUBIPROSTONE 24 MCG PO CAPS
24.0000 ug | ORAL_CAPSULE | Freq: Two times a day (BID) | ORAL | Status: DC
  Administered 2018-08-07 – 2018-08-10 (×6): 24 ug via ORAL
  Filled 2018-08-07 (×7): qty 1

## 2018-08-07 MED ORDER — VANCOMYCIN HCL IN DEXTROSE 1-5 GM/200ML-% IV SOLN
1000.0000 mg | Freq: Once | INTRAVENOUS | Status: AC
  Administered 2018-08-07: 1000 mg via INTRAVENOUS
  Filled 2018-08-07: qty 200

## 2018-08-07 MED ORDER — POTASSIUM CHLORIDE ER 8 MEQ PO TBCR
8.0000 meq | EXTENDED_RELEASE_TABLET | Freq: Every day | ORAL | Status: DC
  Administered 2018-08-09 – 2018-08-10 (×2): 8 meq via ORAL
  Filled 2018-08-07 (×3): qty 1

## 2018-08-07 MED ORDER — ACETAMINOPHEN 650 MG RE SUPP: 650.0000 mg | Freq: Four times a day (QID) | RECTAL | Status: DC | PRN

## 2018-08-07 MED ORDER — INSULIN ASPART 100 UNIT/ML ~~LOC~~ SOLN: 0.0000 [IU] | Freq: Every day | SUBCUTANEOUS | Status: DC

## 2018-08-07 MED ORDER — RISPERIDONE 1 MG PO TABS
1.0000 mg | ORAL_TABLET | Freq: Two times a day (BID) | ORAL | Status: DC
  Administered 2018-08-07 – 2018-08-10 (×6): 1 mg via ORAL
  Filled 2018-08-07 (×7): qty 1

## 2018-08-07 MED ORDER — ENOXAPARIN SODIUM 40 MG/0.4ML ~~LOC~~ SOLN
40.0000 mg | SUBCUTANEOUS | Status: DC
  Administered 2018-08-08 – 2018-08-09 (×2): 40 mg via SUBCUTANEOUS
  Filled 2018-08-07 (×3): qty 0.4

## 2018-08-07 MED ORDER — PANTOPRAZOLE SODIUM 40 MG PO TBEC
40.0000 mg | DELAYED_RELEASE_TABLET | Freq: Every day | ORAL | Status: DC
  Administered 2018-08-08 – 2018-08-10 (×3): 40 mg via ORAL
  Filled 2018-08-07 (×3): qty 1

## 2018-08-07 MED ORDER — VITAMIN D 25 MCG (1000 UNIT) PO TABS
1000.0000 [IU] | ORAL_TABLET | Freq: Every day | ORAL | Status: DC
  Administered 2018-08-08 – 2018-08-10 (×3): 1000 [IU] via ORAL
  Filled 2018-08-07 (×3): qty 1

## 2018-08-07 MED ORDER — ATOMOXETINE HCL 10 MG PO CAPS
40.0000 mg | ORAL_CAPSULE | Freq: Every day | ORAL | Status: DC
  Administered 2018-08-08 – 2018-08-10 (×3): 40 mg via ORAL
  Filled 2018-08-07 (×3): qty 4

## 2018-08-07 MED ORDER — LEVOTHYROXINE SODIUM 100 MCG PO TABS
150.0000 ug | ORAL_TABLET | Freq: Every morning | ORAL | Status: DC
  Administered 2018-08-08 – 2018-08-10 (×3): 150 ug via ORAL
  Filled 2018-08-07: qty 1
  Filled 2018-08-07: qty 2
  Filled 2018-08-07: qty 3
  Filled 2018-08-07: qty 1
  Filled 2018-08-07: qty 2

## 2018-08-07 MED ORDER — ALPRAZOLAM 0.25 MG PO TABS
0.2500 mg | ORAL_TABLET | Freq: Every evening | ORAL | Status: DC | PRN
  Administered 2018-08-07: 0.25 mg via ORAL
  Filled 2018-08-07: qty 1

## 2018-08-07 MED ORDER — IPRATROPIUM-ALBUTEROL 0.5-2.5 (3) MG/3ML IN SOLN
3.0000 mL | Freq: Four times a day (QID) | RESPIRATORY_TRACT | Status: DC
  Administered 2018-08-08 – 2018-08-10 (×10): 3 mL via RESPIRATORY_TRACT
  Filled 2018-08-07 (×10): qty 3

## 2018-08-07 MED ORDER — INSULIN ASPART 100 UNIT/ML ~~LOC~~ SOLN
0.0000 [IU] | Freq: Three times a day (TID) | SUBCUTANEOUS | Status: DC
  Administered 2018-08-08: 3 [IU] via SUBCUTANEOUS
  Administered 2018-08-08: 2 [IU] via SUBCUTANEOUS
  Administered 2018-08-09: 1 [IU] via SUBCUTANEOUS
  Administered 2018-08-09: 2 [IU] via SUBCUTANEOUS
  Administered 2018-08-09: 1 [IU] via SUBCUTANEOUS
  Administered 2018-08-10: 2 [IU] via SUBCUTANEOUS
  Filled 2018-08-07 (×5): qty 1

## 2018-08-07 MED ORDER — VANCOMYCIN HCL IN DEXTROSE 1-5 GM/200ML-% IV SOLN
1000.0000 mg | INTRAVENOUS | Status: AC
  Administered 2018-08-08 – 2018-08-09 (×2): 1000 mg via INTRAVENOUS
  Filled 2018-08-07 (×2): qty 200

## 2018-08-07 MED ORDER — INFLUENZA VAC SPLIT HIGH-DOSE 0.5 ML IM SUSY
0.5000 mL | PREFILLED_SYRINGE | INTRAMUSCULAR | Status: AC
  Administered 2018-08-09: 0.5 mL via INTRAMUSCULAR
  Filled 2018-08-07: qty 0.5

## 2018-08-07 MED ORDER — VITAMIN E 45 MG (100 UNIT) PO CAPS
1000.0000 [IU] | ORAL_CAPSULE | Freq: Every day | ORAL | Status: DC
  Administered 2018-08-08 – 2018-08-10 (×3): 1000 [IU] via ORAL
  Filled 2018-08-07 (×3): qty 2

## 2018-08-07 MED ORDER — BISACODYL 5 MG PO TBEC: 5.0000 mg | DELAYED_RELEASE_TABLET | Freq: Every day | ORAL | Status: DC | PRN

## 2018-08-07 MED ORDER — ONDANSETRON HCL 4 MG/2ML IJ SOLN: 4.0000 mg | Freq: Four times a day (QID) | INTRAMUSCULAR | Status: DC | PRN

## 2018-08-07 MED ORDER — VITAMIN B-1 100 MG PO TABS
100.0000 mg | ORAL_TABLET | Freq: Every day | ORAL | Status: DC
  Administered 2018-08-08 – 2018-08-10 (×3): 100 mg via ORAL
  Filled 2018-08-07 (×3): qty 1

## 2018-08-07 MED ORDER — FERROUS SULFATE 325 (65 FE) MG PO TABS
325.0000 mg | ORAL_TABLET | Freq: Every day | ORAL | Status: DC
  Administered 2018-08-08 – 2018-08-10 (×3): 325 mg via ORAL
  Filled 2018-08-07 (×3): qty 1

## 2018-08-07 MED ORDER — VITAMIN B-12 1000 MCG PO TABS
1000.0000 ug | ORAL_TABLET | Freq: Every day | ORAL | Status: DC
  Administered 2018-08-08 – 2018-08-10 (×3): 1000 ug via ORAL
  Filled 2018-08-07 (×3): qty 1

## 2018-08-07 MED ORDER — PIPERACILLIN-TAZOBACTAM 3.375 G IVPB
3.3750 g | Freq: Three times a day (TID) | INTRAVENOUS | Status: DC
  Administered 2018-08-07 – 2018-08-10 (×6): 3.375 g via INTRAVENOUS
  Filled 2018-08-07 (×6): qty 50

## 2018-08-07 MED ORDER — ROPINIROLE HCL 1 MG PO TABS
0.5000 mg | ORAL_TABLET | Freq: Every day | ORAL | Status: DC
  Administered 2018-08-07 – 2018-08-09 (×3): 0.5 mg via ORAL
  Filled 2018-08-07 (×3): qty 1

## 2018-08-07 MED ORDER — IPRATROPIUM-ALBUTEROL 0.5-2.5 (3) MG/3ML IN SOLN
RESPIRATORY_TRACT | Status: AC
  Filled 2018-08-07: qty 3

## 2018-08-07 MED ORDER — FUROSEMIDE 10 MG/ML IJ SOLN
20.0000 mg | Freq: Two times a day (BID) | INTRAMUSCULAR | Status: DC
  Administered 2018-08-07 – 2018-08-10 (×5): 20 mg via INTRAVENOUS
  Filled 2018-08-07 (×6): qty 4

## 2018-08-07 MED ORDER — ALBUTEROL SULFATE (2.5 MG/3ML) 0.083% IN NEBU
2.5000 mg | INHALATION_SOLUTION | RESPIRATORY_TRACT | Status: DC | PRN
  Administered 2018-08-07: 2.5 mg via RESPIRATORY_TRACT
  Filled 2018-08-07: qty 3

## 2018-08-07 MED ORDER — NITROGLYCERIN 0.4 MG SL SUBL: 0.4000 mg | SUBLINGUAL_TABLET | SUBLINGUAL | Status: DC | PRN

## 2018-08-07 MED ORDER — CELECOXIB 200 MG PO CAPS
200.0000 mg | ORAL_CAPSULE | Freq: Every day | ORAL | Status: DC
  Administered 2018-08-08 – 2018-08-10 (×3): 200 mg via ORAL
  Filled 2018-08-07 (×3): qty 1

## 2018-08-07 MED ORDER — VANCOMYCIN HCL 500 MG IV SOLR
500.0000 mg | Freq: Once | INTRAVENOUS | Status: AC
  Administered 2018-08-07: 500 mg via INTRAVENOUS
  Filled 2018-08-07: qty 500

## 2018-08-07 MED ORDER — CARVEDILOL 6.25 MG PO TABS
6.2500 mg | ORAL_TABLET | Freq: Two times a day (BID) | ORAL | Status: DC
  Administered 2018-08-09 – 2018-08-10 (×3): 6.25 mg via ORAL
  Filled 2018-08-07 (×2): qty 1
  Filled 2018-08-07: qty 2
  Filled 2018-08-07: qty 1
  Filled 2018-08-07: qty 2
  Filled 2018-08-07 (×2): qty 1
  Filled 2018-08-07: qty 2
  Filled 2018-08-07: qty 1

## 2018-08-07 MED ORDER — MOMETASONE FURO-FORMOTEROL FUM 200-5 MCG/ACT IN AERO
2.0000 | INHALATION_SPRAY | Freq: Two times a day (BID) | RESPIRATORY_TRACT | Status: DC
  Administered 2018-08-07 – 2018-08-10 (×6): 2 via RESPIRATORY_TRACT
  Filled 2018-08-07: qty 8.8

## 2018-08-07 NOTE — ED Triage Notes (Addendum)
Pt to ER via POV c/o bilateral leg swelling X 1 week." Was given a medication by my doctor that he said would work, but it hasn't". Pt talking about "voles running across my bed, you know...those little animals".   Alert and oriented to self, situation, time and place.

## 2018-08-07 NOTE — ED Notes (Signed)
DSS called this RN and states pt has no support, will need a cab back home after discharge and assistance into the house. Informed DSS cab will not help pt into her house, will address discharge after pt is seen by MD. Pt lives at Sr [address] living at [address].

## 2018-08-07 NOTE — ED Provider Notes (Signed)
[facility] Emergency Department Provider Note ____________________________________________   I have reviewed the triage vital signs and the nursing notes.   HISTORY  Chief Complaint Leg Swelling   History limited by: Not Limited   HPI Kristen Watts is a 78 y.o. female who presents to the emergency department today because of concerns for progressive and worsening right lower leg swelling and erythema.  The patient states that she is now on her second course of oral antibiotics.  There is pain associated with the swelling and redness. The patient has not had any fevers, nausea or vomiting. She does have some chronic shortness of breath but denies that it is any worse today.    Per medical record review patient has a history of DM, CAD, COPD, CHF. Recent ER visit for cellulitis.   Past Medical History:  Diagnosis Date  . ADD (attention deficit disorder)   . Arthritis   . ASCVD (arteriosclerotic cardiovascular disease)   . Asthma   . Blood transfusion without reported diagnosis   . CHF (congestive heart failure) (HCC)   . COPD (chronic obstructive pulmonary disease) (HCC)   . Coronary artery disease   . Depression   . Diabetes mellitus without complication (HCC)   . Eczema   . GERD (gastroesophageal reflux disease)   . Hematuria   . Hyperlipidemia   . Hypertension   . Obesity   . Osteoarthritis   . Panic attacks   . Phlebitis   . Pulmonary embolism (HCC)   . PVD (peripheral vascular disease) (HCC)   . Sleep apnea   . Stroke (HCC)   . Thyroid disease     Patient Active Problem List   Diagnosis Date Noted  . Acute lower UTI 04/29/2017  . Acute bronchitis 04/29/2017  . Varicose veins of bilateral lower extremities with pain 02/01/2017  . Diabetes (HCC) 08/20/2016  . Essential hypertension, benign 08/20/2016  . COPD (chronic obstructive pulmonary disease) (HCC) 08/20/2016  . Pain in limb 08/20/2016  . Swelling of limb 08/20/2016  . Syncope  07/01/2016    Past Surgical History:  Procedure Laterality Date  . ABDOMINAL HYSTERECTOMY    . BACK SURGERY    . BREAST BIOPSY Right    neg  . BREAST SURGERY    . CARDIAC SURGERY    . CHOLECYSTECTOMY    . ESOPHAGOGASTRODUODENOSCOPY (EGD) WITH PROPOFOL N/A 12/09/2016   Procedure: ESOPHAGOGASTRODUODENOSCOPY (EGD) WITH PROPOFOL;  Surgeon: Christena Deem, MD;  Location: [facility] ENDOSCOPY;  Service: Endoscopy;  Laterality: N/A;  . FRACTURE SURGERY    . TONSILLECTOMY      Prior to Admission medications   Medication Sig Start Date End Date Taking? Authorizing Provider  albuterol (PROVENTIL HFA;VENTOLIN HFA) 108 (90 Base) MCG/ACT inhaler Inhale 1-2 puffs into the lungs every 4 (four) hours as needed for wheezing or shortness of breath.    [provider]  albuterol (PROVENTIL) (2.5 MG/3ML) 0.083% nebulizer solution Take 2.5 mg by nebulization every 4 (four) hours as needed for wheezing or shortness of breath.    [provider]  ALPRAZolam Prudy Feeler) 0.25 MG tablet Take 0.25 mg by mouth at bedtime as needed for sleep. 06/19/18   [provider]  AMITIZA 24 MCG capsule Take 1 capsule by mouth 2 (two) times daily. 03/31/17   [provider]  aspirin EC 81 MG tablet Take 81 mg by mouth daily.    [provider]  atomoxetine (STRATTERA) 40 MG capsule Take 40 mg by mouth daily.  [provider]  carvedilol (COREG) 6.25 MG tablet Take 6.25 mg by mouth 2 (two) times daily with a meal.    [provider]  celecoxib (CELEBREX) 200 MG capsule Take 200 mg by mouth daily. 07/19/18   [provider]  Cholecalciferol 10000 units TABS Take by mouth daily.    [provider]  citalopram (CELEXA) 10 MG tablet Take 10 mg by mouth daily.    [provider]  clotrimazole-betamethasone (LOTRISONE) cream Apply 1 application topically 2 (two) times daily. 02/28/17   [provider]  cyanocobalamin 1000 MCG tablet Take by  mouth daily.    [provider]  cyclobenzaprine (FLEXERIL) 5 MG tablet Take 5 mg by mouth 2 (two) times daily as needed for muscle spasms. 01/18/17   [provider]  DEXILANT 60 MG capsule Take 1 capsule by mouth daily. 03/05/16   [provider]  diphenhydrAMINE (BENADRYL) 25 MG tablet Take 100 mg by mouth 2 (two) times daily.    [provider]  doxycycline (VIBRAMYCIN) 100 MG capsule Take 100 mg by mouth 2 (two) times daily. 07/31/18 08/10/18  [provider]  ferrous sulfate 325 (65 FE) MG tablet Take 325 mg by mouth daily with breakfast. 07/13/18 07/13/19  [provider]  Fluticasone-Salmeterol (ADVAIR DISKUS) 250-50 MCG/DOSE AEPB Inhale 1 puff into the lungs 2 (two) times daily. 08/04/17   [provider]  furosemide (LASIX) 40 MG tablet TAKE ONE TABLET BY MOUTH EVERY DAY AS NEEDED FOR EDEMA 02/12/16   [provider]  hydrocortisone 2.5 % cream Apply 1 application topically 2 (two) times daily. 07/07/17   [provider]  ibuprofen (ADVIL,MOTRIN) 600 MG tablet Take 1 tablet (600 mg total) by mouth every 8 (eight) hours as needed. Patient not taking: Reported on 07/01/2017 11/15/15   Tommi RumpsSummers, Rhonda L, PA-C  Iodoquinol-HC-Aloe Polysacch Lenard Lance(ALCORTIN A) 1-2-1 % GEL Apply topically.    [provider]  ipratropium-albuterol (DUONEB) 0.5-2.5 (3) MG/3ML SOLN Inhale 3 mLs into the lungs 4 (four) times daily. 03/22/18   [provider]  isosorbide mononitrate (IMDUR) 60 MG 24 hr tablet Take 60 mg by mouth daily. 07/17/18   [provider]  losartan (COZAAR) 50 MG tablet Take 50 mg by mouth daily. 03/31/17   [provider]  metFORMIN (GLUCOPHAGE) 500 MG tablet TAKE ONE TABLET BY MOUTH EVERY MORNING WITH BREAKFAST 03/12/16   [provider]  mupirocin ointment (BACTROBAN) 2 % Apply 1 application topically 3 (three) times daily. 07/17/18   [provider]  nitroGLYCERIN (NITROSTAT) 0.4  MG SL tablet Place 0.4 mg under the tongue every 5 (five) minutes as needed for chest pain.    [provider]  nystatin (MYCOSTATIN/NYSTOP) powder Apply 1 application topically 2 (two) times daily. 03/06/18   [provider]  nystatin cream (MYCOSTATIN) Apply 1 application topically 2 (two) times daily. Patient not taking: Reported on 07/01/2017 11/06/15   Bridget HartshornSummers, Rhonda L, PA-C  omega-3 acid ethyl esters (LOVAZA) 1 g capsule Take 1 g by mouth 2 (two) times daily. 12/13/16   [provider]  Potassium 99 MG TABS Take 99 mg by mouth daily.    [provider]  risperiDONE (RISPERDAL) 1 MG tablet Take 1 mg by mouth 2 (two) times daily.    [provider]  rOPINIRole (REQUIP) 0.5 MG tablet Take 0.5 mg by mouth at bedtime. 10/26/17   [provider]  SYNTHROID 150 MCG tablet Take 150 mcg by mouth every  morning. 03/05/16   [provider]  thiamine 100 MG tablet Take 100 mg by mouth daily.    [provider]  vitamin E 1000 UNIT capsule Take 1,000 Units by mouth daily.    [provider]  zinc gluconate 50 MG tablet Take 50 mg by mouth daily.    [provider]    Allergies Clindamycin/lincomycin; Iodine; Stadol [butorphanol]; Darvon [propoxyphene]; Demerol [meperidine]; and Latex  Family History  Problem Relation Age of Onset  . CAD Father   . Breast cancer Neg Hx     Social History Social History   Tobacco Use  . Smoking status: Former Smoker    Types: Cigarettes  . Smokeless tobacco: Never Used  Substance Use Topics  . Alcohol use: No  . Drug use: No    Review of Systems Constitutional: No fever/chills Eyes: No visual changes. ENT: No sore throat. Cardiovascular: Denies chest pain. Respiratory: Denies shortness of breath. Gastrointestinal: No abdominal pain.  No nausea, no vomiting.  No diarrhea.   Genitourinary: Negative for dysuria. Musculoskeletal: Positive for right leg swelling, pain.   Skin: Positive for rash to right leg.  Neurological: Negative for headaches, focal weakness or numbness.  ____________________________________________   PHYSICAL EXAM:  VITAL SIGNS: ED Triage Vitals [08/07/18 1412]  Enc Vitals Group     BP 109/87     Pulse Rate 88     Resp 18     Temp 98.2 F (36.8 C)     Temp Source Oral     SpO2 94 %     Weight 165 lb 5.5 oz (75 kg)     Height 4\' 10"  (1.473 m)     Head Circumference      Peak Flow      Pain Score 0   Constitutional: Alert and oriented.  Eyes: Conjunctivae are normal.  ENT      Head: Normocephalic and atraumatic.      Nose: No congestion/rhinnorhea.      Mouth/Throat: Mucous membranes are moist.      Neck: No stridor. Hematological/Lymphatic/Immunilogical: No cervical lymphadenopathy. Cardiovascular: Normal rate, regular rhythm.  No murmurs, rubs, or gallops.  Respiratory: Normal respiratory effort without tachypnea nor retractions. Breath sounds are clear and equal bilaterally. No wheezes/rales/rhonchi. Gastrointestinal: Soft and non tender. No rebound. No guarding.  Genitourinary: Deferred Musculoskeletal: Edema and erythema to right leg. Warm to touch.  Neurologic:  Normal speech and language. No gross focal neurologic deficits are appreciated.  Skin:  Skin is warm, dry and intact. No rash noted. Psychiatric: Mood and affect are normal. Speech and behavior are normal. Patient exhibits appropriate insight and judgment.  ____________________________________________    LABS (pertinent positives/negatives)  Trop <0.03 BMP wnl except glu 147, ca 8.8 CBC wbc 6.5, hgb 10.1, plt 194  ____________________________________________   EKG  I, Phineas Semen, attending physician, personally viewed and interpreted this EKG  EKG Time: 1417 Rate: 83 Rhythm: normal sinus rhythm Axis: left axis deviation Intervals: qtc 441 QRS: narrow ST changes: no st elevation Impression: abnormal  ekg  ____________________________________________    RADIOLOGY  CXR Question atelectasis vs infiltrate  ____________________________________________   PROCEDURES  Procedures  ____________________________________________   INITIAL IMPRESSION / ASSESSMENT AND PLAN / ED COURSE  Pertinent labs & imaging results that were available during my care of the patient were reviewed by me and considered in my medical decision making (see chart for details).   Patient presented to the emergency department today because of concerns for worsening right  leg swelling, discomfort and redness.  Exam is consistent with cellulitis.  She has been on oral antibiotics without any improvement.  At this point do think patient would benefit from IV antibiotics.  Patient blood work and vital signs not consistent with sepsis at this time.  Discussed plan of admission with IV antibiotics patient.  Discussed patient with hospitalist.  ____________________________________________   FINAL CLINICAL IMPRESSION(S) / ED DIAGNOSES  Final diagnoses:  Cellulitis, unspecified cellulitis site     Note: This dictation was prepared with Dragon dictation. Any transcriptional errors that result from this process are unintentional     Phineas Semen, MD 08/07/18 [id number]

## 2018-08-07 NOTE — Progress Notes (Signed)
Pharmacy Antibiotic Note  Kristen Watts is a 78 y.o. female admitted on 08/07/2018 with cellulitis.  Pharmacy has been consulted for vancomycin and zosyn dosing.  Plan: Vancomycin 1000 mg IV Q 24 hrs. Goal AUC 400-550. Expected AUC: 507.9 SCr used: 0.8  Zosyn 3.375mg  every 8 hours   Height: 4\' 10"  (147.3 cm) Weight: 165 lb 5.5 oz (75 kg) IBW/kg (Calculated) : 40.9  Temp (24hrs), Avg:98.2 F (36.8 C), Min:98.2 F (36.8 C), Max:98.2 F (36.8 C)  Recent Labs  Lab 08/07/18 1415  WBC 6.5  CREATININE 0.71    Estimated Creatinine Clearance: 51.5 mL/min (by C-G formula based on SCr of 0.71 mg/dL).    Allergies  Allergen Reactions  . Clindamycin/Lincomycin   . Iodine Swelling  . Stadol [Butorphanol] Other (See Comments)    LOC  . Darvon [Propoxyphene] Rash  . Demerol [Meperidine] Palpitations  . Latex Rash    Antimicrobials this admission: 3/9 vancomycin >>  3/9 zosyn >>      Thank you for allowing pharmacy to be a part of this patient's care.  Kristen Watts 08/07/2018 8:16 PM

## 2018-08-07 NOTE — ED Notes (Signed)
Pt has periods of confusion.

## 2018-08-07 NOTE — H&P (Addendum)
[facility] [place] [facility]   PATIENT NAME: Kristen Watts    MR#:  [id number]  DATE OF BIRTH:  [birth year]  DATE OF ADMISSION:  08/07/2018  PRIMARY CARE PHYSICIAN: Barbette Reichmann, MD   REQUESTING/REFERRING PHYSICIAN: Dr. Derrill Kay.  CHIEF COMPLAINT:   Chief Complaint  Patient presents with  . Leg Swelling   Leg redness, pain and swelling for 10 days. HISTORY OF PRESENT ILLNESS:  Kristen Watts  is a 78 y.o. female with a known history of multiple medical problems as below.  The patient presents the ED with above chief complaint.  She started to have right leg redness, pain and swelling 10 days ago, which has been worsening.  She has been taking doxycycline for several days without improvement.  She denies any fever or chills.  But she has worsening shortness of breath, cough and wheezing for the past 1 months.  She is following Dr. Meredeth Ide for COPD as outpatient. PAST MEDICAL HISTORY:   Past Medical History:  Diagnosis Date  . ADD (attention deficit disorder)   . Arthritis   . ASCVD (arteriosclerotic cardiovascular disease)   . Asthma   . Blood transfusion without reported diagnosis   . CHF (congestive heart failure) (HCC)   . COPD (chronic obstructive pulmonary disease) (HCC)   . Coronary artery disease   . Depression   . Diabetes mellitus without complication (HCC)   . Eczema   . GERD (gastroesophageal reflux disease)   . Hematuria   . Hyperlipidemia   . Hypertension   . Obesity   . Osteoarthritis   . Panic attacks   . Phlebitis   . Pulmonary embolism (HCC)   . PVD (peripheral vascular disease) (HCC)   . Sleep apnea   . Stroke (HCC)   . Thyroid disease     PAST SURGICAL HISTORY:   Past Surgical History:  Procedure Laterality Date  . ABDOMINAL HYSTERECTOMY    . BACK SURGERY    . BREAST BIOPSY Right    neg  . BREAST SURGERY    . CARDIAC SURGERY    . CHOLECYSTECTOMY    . ESOPHAGOGASTRODUODENOSCOPY (EGD) WITH PROPOFOL N/A  12/09/2016   Procedure: ESOPHAGOGASTRODUODENOSCOPY (EGD) WITH PROPOFOL;  Surgeon: Christena Deem, MD;  Location: [facility] ENDOSCOPY;  Service: Endoscopy;  Laterality: N/A;  . FRACTURE SURGERY    . TONSILLECTOMY      SOCIAL HISTORY:   Social History   Tobacco Use  . Smoking status: Former Smoker    Types: Cigarettes  . Smokeless tobacco: Never Used  Substance Use Topics  . Alcohol use: No    FAMILY HISTORY:   Family History  Problem Relation Age of Onset  . CAD Father   . Breast cancer Neg Hx     DRUG ALLERGIES:   Allergies  Allergen Reactions  . Clindamycin/Lincomycin   . Iodine Swelling  . Stadol [Butorphanol] Other (See Comments)    LOC  . Darvon [Propoxyphene] Rash  . Demerol [Meperidine] Palpitations  . Latex Rash    REVIEW OF SYSTEMS:   Review of Systems  Constitutional: Positive for malaise/fatigue. Negative for chills and fever.  HENT: Negative for sore throat.   Eyes: Negative for blurred vision and double vision.  Respiratory: Positive for cough, shortness of breath and wheezing. Negative for hemoptysis, sputum production and stridor.   Cardiovascular: Positive for leg swelling. Negative for chest pain, palpitations and orthopnea.  Gastrointestinal: Negative for abdominal pain, blood in stool, diarrhea, melena, nausea and  vomiting.  Genitourinary: Negative for dysuria, flank pain and hematuria.  Musculoskeletal: Negative for back pain and joint pain.       Right leg swelling, redness and pain.  Left foot ankle swelling, pain and redness.  Neurological: Negative for dizziness, sensory change, focal weakness, seizures, loss of consciousness, weakness and headaches.  Endo/Heme/Allergies: Negative for polydipsia.  Psychiatric/Behavioral: Negative for depression. The patient is not nervous/anxious.     MEDICATIONS AT HOME:   Prior to Admission medications   Medication Sig Start Date End Date Taking? Authorizing Provider  albuterol (PROVENTIL HFA;VENTOLIN  HFA) 108 (90 Base) MCG/ACT inhaler Inhale 1-2 puffs into the lungs every 4 (four) hours as needed for wheezing or shortness of breath.    [provider]  albuterol (PROVENTIL) (2.5 MG/3ML) 0.083% nebulizer solution Take 2.5 mg by nebulization every 4 (four) hours as needed for wheezing or shortness of breath.    [provider]  ALPRAZolam Prudy Feeler(XANAX) 0.25 MG tablet Take 0.25 mg by mouth at bedtime as needed for sleep. 06/19/18   [provider]  AMITIZA 24 MCG capsule Take 1 capsule by mouth 2 (two) times daily. 03/31/17   [provider]  aspirin EC 81 MG tablet Take 81 mg by mouth daily.    [provider]  atomoxetine (STRATTERA) 40 MG capsule Take 40 mg by mouth daily.    [provider]  carvedilol (COREG) 6.25 MG tablet Take 6.25 mg by mouth 2 (two) times daily with a meal.    [provider]  celecoxib (CELEBREX) 200 MG capsule Take 200 mg by mouth daily. 07/19/18   [provider]  Cholecalciferol 10000 units TABS Take by mouth daily.    [provider]  citalopram (CELEXA) 10 MG tablet Take 10 mg by mouth daily.    [provider]  clotrimazole-betamethasone (LOTRISONE) cream Apply 1 application topically 2 (two) times daily. 02/28/17   [provider]  cyanocobalamin 1000 MCG tablet Take by mouth daily.    [provider]  cyclobenzaprine (FLEXERIL) 5 MG tablet Take 5 mg by mouth 2 (two) times daily as needed for muscle spasms. 01/18/17   [provider]  DEXILANT 60 MG capsule Take 1 capsule by mouth daily. 03/05/16   [provider]  diphenhydrAMINE (BENADRYL) 25 MG tablet Take 100 mg by mouth 2 (two) times daily.    [provider]  doxycycline (VIBRAMYCIN) 100 MG capsule Take 100 mg by mouth 2 (two) times daily. 07/31/18 08/10/18  [provider]  ferrous sulfate 325 (65 FE) MG tablet Take 325 mg by mouth daily with breakfast. 07/13/18 07/13/19  [provider]  Fluticasone-Salmeterol (ADVAIR DISKUS) 250-50 MCG/DOSE AEPB Inhale 1 puff into the lungs 2 (two) times daily. 08/04/17   [provider]  furosemide (LASIX) 40 MG tablet TAKE ONE TABLET BY MOUTH EVERY DAY AS NEEDED FOR EDEMA 02/12/16   [provider]  hydrocortisone 2.5 % cream Apply 1 application topically 2 (two) times daily. 07/07/17   [provider]  ibuprofen (ADVIL,MOTRIN) 600 MG tablet Take 1 tablet (600 mg total) by mouth every 8 (eight) hours as needed. Patient not taking: Reported on 07/01/2017 11/15/15   Tommi RumpsSummers, Rhonda L, PA-C  Iodoquinol-HC-Aloe Polysacch Lenard Lance(ALCORTIN A) 1-2-1 % GEL Apply topically.    [provider]  ipratropium-albuterol (DUONEB) 0.5-2.5 (3) MG/3ML SOLN Inhale 3 mLs into the lungs 4 (four) times daily. 03/22/18   [provider]  isosorbide mononitrate (IMDUR) 60 MG 24 hr  tablet Take 60 mg by mouth daily. 07/17/18   [provider]  losartan (COZAAR) 50 MG tablet Take 50 mg by mouth daily. 03/31/17   [provider]  metFORMIN (GLUCOPHAGE) 500 MG tablet TAKE ONE TABLET BY MOUTH EVERY MORNING WITH BREAKFAST 03/12/16   [provider]  mupirocin ointment (BACTROBAN) 2 % Apply 1 application topically 3 (three) times daily. 07/17/18   [provider]  nitroGLYCERIN (NITROSTAT) 0.4 MG SL tablet Place 0.4 mg under the tongue every 5 (five) minutes as needed for chest pain.    [provider]  nystatin (MYCOSTATIN/NYSTOP) powder Apply 1 application topically 2 (two) times daily. 03/06/18   [provider]  nystatin cream (MYCOSTATIN) Apply 1 application topically 2 (two) times daily. Patient not taking: Reported on 07/01/2017 11/06/15   Bridget Hartshorn L, PA-C  omega-3 acid ethyl esters (LOVAZA) 1 g capsule Take 1 g by mouth 2 (two) times daily. 12/13/16   [provider]  Potassium 99 MG TABS Take 99 mg by mouth daily.    [provider]  risperiDONE  (RISPERDAL) 1 MG tablet Take 1 mg by mouth 2 (two) times daily.    [provider]  rOPINIRole (REQUIP) 0.5 MG tablet Take 0.5 mg by mouth at bedtime. 10/26/17   [provider]  SYNTHROID 150 MCG tablet Take 150 mcg by mouth every morning. 03/05/16   [provider]  thiamine 100 MG tablet Take 100 mg by mouth daily.    [provider]  vitamin E 1000 UNIT capsule Take 1,000 Units by mouth daily.    [provider]  zinc gluconate 50 MG tablet Take 50 mg by mouth daily.    [provider]      VITAL SIGNS:  Blood pressure 109/87, pulse 88, temperature 98.2 F (36.8 C), temperature source Oral, resp. rate 18, height 4\' 10"  (1.473 m), weight 75 kg, SpO2 94 %.  PHYSICAL EXAMINATION:  Physical Exam  GENERAL:  78 y.o.-year-old patient lying in the bed with no acute distress.  Obesity. EYES: Pupils equal, round, reactive to light and accommodation. No scleral icterus. Extraocular muscles intact.  HEENT: Head atraumatic, normocephalic. Oropharynx and nasopharynx clear.  NECK:  Supple, no jugular venous distention. No thyroid enlargement, no tenderness.  LUNGS: Bilateral mild expiratory wheezing, no rales,rhonchi or crepitation. No use of accessory muscles of respiration.  CARDIOVASCULAR: S1, S2 normal. No murmurs, rubs, or gallops.  ABDOMEN: Soft, nontender, nondistended. Bowel sounds present. No organomegaly or mass.  EXTREMITIES: Bilateral leg swelling, erythema and tenderness.  Right side up to thigh, left side up to lower part of leg.  No cyanosis, or clubbing.  NEUROLOGIC: Cranial nerves II through XII are intact. Muscle strength 5/5 in all extremities. Sensation intact. Gait not checked.  PSYCHIATRIC: The patient is alert and oriented x 3.  SKIN: No obvious rash, lesion, or ulcer.   LABORATORY PANEL:   CBC Recent Labs  Lab 08/07/18 1415  WBC 6.5  HGB 10.1*  HCT 34.8*  PLT 194    ------------------------------------------------------------------------------------------------------------------  Chemistries  Recent Labs  Lab 08/07/18 1415  NA 138  K 3.9  CL 104  CO2 25  GLUCOSE 147*  BUN 18  CREATININE 0.71  CALCIUM 8.8*   ------------------------------------------------------------------------------------------------------------------  Cardiac Enzymes Recent Labs  Lab 08/07/18 1415  TROPONINI <0.03   ------------------------------------------------------------------------------------------------------------------  RADIOLOGY:  Dg Chest 2 View  Result Date: 08/07/2018 CLINICAL DATA:  Chest pain.  Bilateral leg swelling. EXAM: CHEST - 2 VIEW COMPARISON:  Radiograph of July 01, 2017. FINDINGS: Stable cardiomediastinal silhouette. Atherosclerosis of thoracic aorta is noted. Central pulmonary vascular congestion is noted. Minimal right basilar subsegmental atelectasis is noted. Probable lingular subsegmental atelectasis or infiltrate is noted. No pneumothorax or pleural effusion is noted. Bony thorax is unremarkable. IMPRESSION: Probable mild left lingular subsegmental atelectasis or infiltrate is noted. Minimal right basilar subsegmental atelectasis is noted. Stable cardiomegaly with central pulmonary vascular congestion. Aortic Atherosclerosis (ICD10-I70.0). Electronically Signed   By: Lupita Raider, M.D.   On: 08/07/2018 15:32      IMPRESSION AND PLAN:   Bilateral leg cellulitis. Patient will be admitted to medical floor. Continue vancomycin and Zosyn. Venous ultrasound of bilateral leg this month did not show any DVT.  Bilateral leg edema.  Unclear etiology Lasix IV and follow-up echocardiograph. Hypertension.  Continue Coreg, Lasix IV, hold other hypertension medication due to softer blood pressure. Chronic respiratory failure due to COPD. Continue home oxygen, DuoNeb as needed, continue home medication. Anemia of chronic disease.   Stable. Diabetes.  Start sliding scale.  Hold metformin. History of CAD, PVD and CVA.  Continue home medication. Sleep apnea. All the records are reviewed and case discussed with ED provider. Management plans discussed with the patient, family and they are in agreement.  CODE STATUS: Full code.  TOTAL TIME TAKING CARE OF THIS PATIENT: 40 minutes.    Shaune Pollack M.D on 08/07/2018 at 6:42 PM  Between 7am to 6pm - Pager - [phone]  After 6pm go to [email or web address] - Social research officer, government  [facility]  Office  [phone]  CC: Primary care physician; Barbette Reichmann, MD   Note: This dictation was prepared with Dragon dictation along with smaller phrase technology. Any transcriptional errors that result from this process are unin

## 2018-08-07 NOTE — ED Triage Notes (Signed)
Patient has caregiver that dropped her off.  Caregiver states that staff should call social services at [phone] when patient is leaving ED and patient's case worker will call a cab for patient.  If it is after hours, staff can call the [facility] social services after hours number.

## 2018-08-07 NOTE — Plan of Care (Signed)
?  Problem: Clinical Measurements: ?Goal: Ability to avoid or minimize complications of infection will improve ?Outcome: Progressing ?  ?Problem: Skin Integrity: ?Goal: Skin integrity will improve ?Outcome: Progressing ?  ?

## 2018-08-07 NOTE — ED Notes (Signed)
ED TO INPATIENT HANDOFF REPORT  ED Nurse Name and Phone #: Justun Anaya D [phone]  S Name/Age/Gender Maryagnes Zenaida NieceVan Riper 78 y.o. female Room/Bed: ED09A/ED09A  Code Status   Code Status: Prior  [place] [place] [facility] with social worker follow up Alert and oriented but does easily go off topic  Triage Complete: Triage complete  Chief Complaint pain in both legs  Triage Note Patient has caregiver that dropped her off.  Caregiver states that staff should call social services at [phone] when patient is leaving ED and patient's case worker will call a cab for patient.  If it is after hours, staff [facility] hours number.    Pt to ER via POV c/o bilateral leg swelling X 1 week." Was given a medication by my doctor that he said would work, but it hasn't". Pt talking about "voles running across my bed, you know...those little animals".   Alert and oriented to self, situation, time and place.   Allergies Allergies  Allergen Reactions  . Clindamycin/Lincomycin   . Iodine Swelling  . Stadol [Butorphanol] Other (See Comments)    LOC  . Darvon [Propoxyphene] Rash  . Demerol [Meperidine] Palpitations  . Latex Rash    Level of Care/Admitting Diagnosis ED Disposition    ED Disposition Condition Comment   Admit  [facility] [[id number][facility]  Level of Care: Med-Surg [16]  Diagnosis: Bilateral lower leg cellulitis [721200]  Admitting Physician: Shaune PollackCHEN, QING [[id number]]  Attending Physician: Shaune PollackCHEN, QING [[id number]]  Estimated length of stay: past midnight tomorrow  Certification:: I certify this patient will need inpatient services for at least 2 midnights  PT Class (Do Not Modify): Inpatient [101]  PT Acc Code (Do Not Modify): Private [1]       B Medical/Surgery History Past Medical History:  Diagnosis Date  . ADD (attention deficit disorder)   . Arthritis   . ASCVD (arteriosclerotic cardiovascular disease)   . Asthma   . Blood  transfusion without reported diagnosis   . CHF (congestive heart failure) (HCC)   . COPD (chronic obstructive pulmonary disease) (HCC)   . Coronary artery disease   . Depression   . Diabetes mellitus without complication (HCC)   . Eczema   . GERD (gastroesophageal reflux disease)   . Hematuria   . Hyperlipidemia   . Hypertension   . Obesity   . Osteoarthritis   . Panic attacks   . Phlebitis   . Pulmonary embolism (HCC)   . PVD (peripheral vascular disease) (HCC)   . Sleep apnea   . Stroke (HCC)   . Thyroid disease    Past Surgical History:  Procedure Laterality Date  . ABDOMINAL HYSTERECTOMY    . BACK SURGERY    . BREAST BIOPSY Right    neg  . BREAST SURGERY    . CARDIAC SURGERY    . CHOLECYSTECTOMY    . ESOPHAGOGASTRODUODENOSCOPY (EGD) WITH PROPOFOL N/A 12/09/2016   Procedure: ESOPHAGOGASTRODUODENOSCOPY (EGD) WITH PROPOFOL;  Surgeon: Christena DeemSkulskie, Martin U, MD;  [facility];  Service: Endoscopy;  Laterality: N/A;  . FRACTURE SURGERY    . TONSILLECTOMY       A IV Location/Drains/Wounds Patient Lines/Drains/Airways Status   Active Line/Drains/Airways    Name:   Placement date:   Placement time:   Site:   Days:   Peripheral IV 07/31/18 Right Forearm   07/31/18    1634    Forearm   7   Peripheral IV 08/07/18 Left Forearm  08/07/18    1852    Forearm   less than 1          Intake/Output Last 24 hours No intake or output data in the 24 hours ending 08/07/18 1951  Labs/Imaging Results for orders placed or performed during the hospital encounter of 08/07/18 (from the past 48 hour(s))  Basic metabolic panel     Status: Abnormal   Collection Time: 08/07/18  2:15 PM  Result Value Ref Range   Sodium 138 135 - 145 mmol/L   Potassium 3.9 3.5 - 5.1 mmol/L   Chloride 104 98 - 111 mmol/L   CO2 25 22 - 32 mmol/L   Glucose, Bld 147 (H) 70 - 99 mg/dL   BUN 18 8 - 23 mg/dL   Creatinine, Ser 5.40 0.44 - 1.00 mg/dL   Calcium 8.8 (L) 8.9 - 10.3 mg/dL   GFR calc non  Af Amer >60 >60 mL/min   GFR calc Af Amer >60 >60 mL/min   Anion gap 9 5 - 15    Comment: Performed at [facility], [address] [place], [place] [address]  CBC     Status: Abnormal   Collection Time: 08/07/18  2:15 PM  Result Value Ref Range   WBC 6.5 4.0 - 10.5 K/uL   RBC 4.41 3.87 - 5.11 MIL/uL   Hemoglobin 10.1 (L) 12.0 - 15.0 g/dL   HCT 14.7 (L) 82.9 - 56.2 %   MCV 78.9 (L) 80.0 - 100.0 fL   MCH 22.9 (L) 26.0 - 34.0 pg   MCHC 29.0 (L) 30.0 - 36.0 g/dL   RDW 13.0 (H) 86.5 - 78.4 %   Platelets 194 150 - 400 K/uL   nRBC 0.0 0.0 - 0.2 %    Comment: Performed at [facility], [address] [place], [place] [address]  Troponin I - ONCE - STAT     Status: None   Collection Time: 08/07/18  2:15 PM  Result Value Ref Range   Troponin I <0.03 <0.03 ng/mL    Comment: Performed at [facility], [address] [place], [place] [address]   Dg Chest 2 View  Result Date: 08/07/2018 CLINICAL DATA:  Chest pain.  Bilateral leg swelling. EXAM: CHEST - 2 VIEW COMPARISON:  Radiograph of July 01, 2017. FINDINGS: Stable cardiomediastinal silhouette. Atherosclerosis of thoracic aorta is noted. Central pulmonary vascular congestion is noted. Minimal right basilar subsegmental atelectasis is noted. Probable lingular subsegmental atelectasis or infiltrate is noted. No pneumothorax or pleural effusion is noted. Bony thorax is unremarkable. IMPRESSION: Probable mild left lingular subsegmental atelectasis or infiltrate is noted. Minimal right basilar subsegmental atelectasis is noted. Stable cardiomegaly with central pulmonary vascular congestion. Aortic Atherosclerosis (ICD10-I70.0). Electronically Signed   By: Lupita Raider, M.D.   On: 08/07/2018 15:32    Pending Labs [employer] (From admission, onward)    Start     Ordered   Signed and Armed forces training and education officer morning,   R     Signed and Held   Signed and Held  CBC  Tomorrow morning,   R      Signed and Held   Signed and Held  Creatinine, serum  (enoxaparin (LOVENOX)    CrCl >/= 30 ml/min)  Weekly,   R    Comments:  while on enoxaparin therapy    Signed and Held   Signed and Held  Magnesium  Add-on,   R     Signed and Held  Signed and Held  Hemoglobin A1c  Add-on,   R     Signed and Held          Vitals/Pain Today's Vitals   08/07/18 1412  BP: 109/87  Pulse: 88  Resp: 18  Temp: 98.2 F (36.8 C)  TempSrc: Oral  SpO2: 94%  Weight: 75 kg  Height: 4\' 10"  (1.473 m)  PainSc: 0-No pain    Isolation Precautions No active isolations  Medications Medications  ipratropium-albuterol (DUONEB) 0.5-2.5 (3) MG/3ML nebulizer solution (  Not Given 08/07/18 1826)  vancomycin (VANCOCIN) IVPB 1000 mg/200 mL premix (0 mg Intravenous Stopped 08/07/18 1914)  ipratropium-albuterol (DUONEB) 0.5-2.5 (3) MG/3ML nebulizer solution 3 mL (3 mLs Nebulization Given 08/07/18 1826)    Mobility walks with device Moderate fall risk   Focused Assessments  Cellulitis lower legs    R Recommendations: See Admitting Provider Note  Report given to:   Additional Notes:

## 2018-08-07 NOTE — ED Notes (Signed)
Pt advocate Harriett Rush swapped out the close to empty O2 for a full tank. Pt on 2L

## 2018-08-07 NOTE — Progress Notes (Signed)
Advanced Care Plan.  Purpose of Encounter: CODE STATUS. Parties in Attendance: The patient, RN and me. Patient's Decisional Capacity: Yes. Medical Story: Kristen Watts  is a 78 y.o. female with a known history of multiple medical problems including hypertension, diabetes, CAD, PVD, CHF, COPD, chronic respiratory failure on home oxygen 3 L, pulmonary embolism, sleep apnea and stroke etc. The patient is being admitted bilateral leg cellulitis.  I discussed with patient about her current condition, prognosis and CODE STATUS.  The patient said she wants to try resuscitation and intubation once if she has cardiopulmonary arrest. Plan:  Code Status: Full code. Time spent discussing advance care planning:18 minutes.

## 2018-08-07 NOTE — ED Notes (Signed)
Pt is in bathroom in lobby, will room when done.

## 2018-08-08 ENCOUNTER — Encounter: Payer: Self-pay | Admitting: Pulmonary Disease

## 2018-08-08 ENCOUNTER — Inpatient Hospital Stay
Admit: 2018-08-08 | Discharge: 2018-08-08 | Disposition: A | Discharge status: Home / Self Care | Payer: Medicare HMO | Attending: Internal Medicine | Admitting: Internal Medicine

## 2018-08-08 LAB — GLUCOSE, CAPILLARY
Glucose-Capillary: 120 mg/dL — ABNORMAL HIGH (ref 70–99)
Glucose-Capillary: 208 mg/dL — ABNORMAL HIGH (ref 70–99)
Glucose-Capillary: 171 mg/dL — ABNORMAL HIGH (ref 70–99)
Glucose-Capillary: 178 mg/dL — ABNORMAL HIGH (ref 70–99)

## 2018-08-08 LAB — BASIC METABOLIC PANEL
Anion gap: 8 (ref 5–15)
BUN: 14 mg/dL (ref 8–23)
CO2: 28 mmol/L (ref 22–32)
CREATININE: 0.79 mg/dL (ref 0.44–1.00)
Calcium: 8.5 mg/dL — ABNORMAL LOW (ref 8.9–10.3)
Chloride: 104 mmol/L (ref 98–111)
GFR calc Af Amer: >60 mL/min (ref >60)
GFR calc non Af Amer: >60 mL/min (ref >60)
Glucose, Bld: 129 mg/dL — ABNORMAL HIGH (ref 70–99)
Potassium: 3.9 mmol/L (ref 3.5–5.1)
Sodium: 140 mmol/L (ref 135–145)

## 2018-08-08 LAB — ECHOCARDIOGRAM COMPLETE
Height: 58 in
WEIGHTICAEL: 2645.52 [oz_av]

## 2018-08-08 LAB — CBC
HCT: 31.9 % — ABNORMAL LOW (ref 36.0–46.0)
Hemoglobin: 9.1 g/dL — ABNORMAL LOW (ref 12.0–15.0)
MCH: 22.7 pg — ABNORMAL LOW (ref 26.0–34.0)
MCHC: 28.5 g/dL — ABNORMAL LOW (ref 30.0–36.0)
MCV: 79.6 fL — AB (ref 80.0–100.0)
Platelets: 179 10*3/uL (ref 150–400)
RBC: 4.01 MIL/uL (ref 3.87–5.11)
RDW: 15.6 % — ABNORMAL HIGH (ref 11.5–15.5)
WBC: 9 10*3/uL (ref 4.0–10.5)
nRBC: 0 % (ref 0.0–0.2)

## 2018-08-08 LAB — HEMOGLOBIN A1C
HEMOGLOBIN A1C: 6.2 % — AB (ref 4.8–5.6)
MEAN PLASMA GLUCOSE: 131.24 mg/dL

## 2018-08-08 MED ORDER — PREDNISONE 20 MG PO TABS
40.0000 mg | ORAL_TABLET | Freq: Every day | ORAL | Status: DC
  Administered 2018-08-08 – 2018-08-10 (×3): 40 mg via ORAL
  Filled 2018-08-08 (×3): qty 2

## 2018-08-08 NOTE — Care Management (Signed)
Went to room to complete assessment.  Patient is sound asleep, unable to complete assessment as patient is not easily aroused to have conversation, left HHlist in the room at the bedside, will complete assessment when patient is awake

## 2018-08-08 NOTE — Care Management (Signed)
Notified Brad with [employer] that needs a 3 in 1

## 2018-08-08 NOTE — Consult Note (Addendum)
Pulmonary Medicine          Date: 08/08/2018,   MRN# [id number] Kristen Watts June 24, 1940     AdmissionWeight: 75 kg                 CurrentWeight: 75 kg      CHIEF COMPLAINT:   COPD   HISTORY OF PRESENT ILLNESS    This is a pleasant 78 yo female who was admitted for Lower extermity cellulitis and COPD.  I was asked to see her for worsening COPD by Dr Imogene Burn.  She has hx of COPD and chronic hypoxemic respiratory failure.  Patient reports worsening breathing post multiple open skin scrapes which were caused by small pets around house.  She does have chronic hypoxemic respiratory failure and requires 4 L/min O2 at home.  Currently she is requiring typical COPD carepath with around the clock nebulizer and O2 while cellulitis is being addressed. She reports her breathing is much worse now then baseline with some phlegm being produced.     PAST MEDICAL HISTORY   Past Medical History:  Diagnosis Date  . ADD (attention deficit disorder)   . Arthritis   . ASCVD (arteriosclerotic cardiovascular disease)   . Asthma   . Blood transfusion without reported diagnosis   . CHF (congestive heart failure) (HCC)   . COPD (chronic obstructive pulmonary disease) (HCC)   . Coronary artery disease   . Depression   . Diabetes mellitus without complication (HCC)   . Eczema   . GERD (gastroesophageal reflux disease)   . Hematuria   . Hyperlipidemia   . Hypertension   . Obesity   . Osteoarthritis   . Panic attacks   . Phlebitis   . Pulmonary embolism (HCC)   . PVD (peripheral vascular disease) (HCC)   . Sleep apnea   . Stroke (HCC)   . Thyroid disease      SURGICAL HISTORY   Past Surgical History:  Procedure Laterality Date  . ABDOMINAL HYSTERECTOMY    . BACK SURGERY    . BREAST BIOPSY Right    neg  . BREAST SURGERY    . CARDIAC SURGERY    . CHOLECYSTECTOMY    . ESOPHAGOGASTRODUODENOSCOPY (EGD) WITH PROPOFOL N/A 12/09/2016   Procedure: ESOPHAGOGASTRODUODENOSCOPY (EGD)  WITH PROPOFOL;  Surgeon: Christena Deem, MD;  Location: [facility] ENDOSCOPY;  Service: Endoscopy;  Laterality: N/A;  . FRACTURE SURGERY    . TONSILLECTOMY       FAMILY HISTORY   Family History  Problem Relation Age of Onset  . CAD Father   . Breast cancer Neg Hx      SOCIAL HISTORY   Social History   Tobacco Use  . Smoking status: Former Smoker    Types: Cigarettes  . Smokeless tobacco: Never Used  Substance Use Topics  . Alcohol use: No  . Drug use: No     MEDICATIONS    Home Medication:    Current Medication:  Current Facility-Administered Medications:  .  acetaminophen (TYLENOL) tablet 650 mg, 650 mg, Oral, Q6H PRN **OR** acetaminophen (TYLENOL) suppository 650 mg, 650 mg, Rectal, Q6H PRN, Shaune Pollack, MD .  albuterol (PROVENTIL) (2.5 MG/3ML) 0.083% nebulizer solution 2.5 mg, 2.5 mg, Nebulization, Q2H PRN, Shaune Pollack, MD, 2.5 mg at 08/07/18 2334 .  ALPRAZolam Prudy Feeler) tablet 0.25 mg, 0.25 mg, Oral, QHS PRN, Shaune Pollack, MD, 0.25 mg at 08/07/18 2330 .  atomoxetine (STRATTERA) capsule 40 mg, 40 mg, Oral, Daily, Shaune Pollack, MD .  bisacodyl (DULCOLAX) EC tablet 5 mg, 5 mg, Oral, Daily PRN, Shaune Pollackhen, Qing, MD .  carvedilol (COREG) tablet 6.25 mg, 6.25 mg, Oral, BID WC, Shaune Pollackhen, Qing, MD .  celecoxib (CELEBREX) capsule 200 mg, 200 mg, Oral, Daily, Shaune Pollackhen, Qing, MD .  cholecalciferol (VITAMIN D3) tablet 1,000 Units, 1,000 Units, Oral, Daily, Shaune Pollackhen, Qing, MD .  citalopram (CELEXA) tablet 10 mg, 10 mg, Oral, Daily, Shaune Pollackhen, Qing, MD .  cyclobenzaprine (FLEXERIL) tablet 5 mg, 5 mg, Oral, BID PRN, Shaune Pollackhen, Qing, MD .  enoxaparin (LOVENOX) injection 40 mg, 40 mg, Subcutaneous, Q24H, Shaune Pollackhen, Qing, MD .  ferrous sulfate tablet 325 mg, 325 mg, Oral, Q breakfast, Shaune Pollackhen, Qing, MD .  furosemide (LASIX) injection 20 mg, 20 mg, Intravenous, Q12H, Shaune Pollackhen, Qing, MD, 20 mg at 08/07/18 2210 .  Influenza vac split quadrivalent PF (FLUZONE HIGH-DOSE) injection 0.5 mL, 0.5 mL, Intramuscular, Tomorrow-1000, Shaune Pollackhen,  Qing, MD .  insulin aspart (novoLOG) injection 0-5 Units, 0-5 Units, Subcutaneous, QHS, Shaune Pollackhen, Qing, MD .  insulin aspart (novoLOG) injection 0-9 Units, 0-9 Units, Subcutaneous, TID WC, Shaune Pollackhen, Qing, MD .  ipratropium-albuterol (DUONEB) 0.5-2.5 (3) MG/3ML nebulizer solution 3 mL, 3 mL, Inhalation, QID, Shaune Pollackhen, Qing, MD, 3 mL at 08/08/18 0815 .  levothyroxine (SYNTHROID, LEVOTHROID) tablet 150 mcg, 150 mcg, Oral, q morning - 10a, Shaune Pollackhen, Qing, MD, 150 mcg at 08/08/18 0602 .  lubiprostone (AMITIZA) capsule 24 mcg, 24 mcg, Oral, BID, Shaune Pollackhen, Qing, MD, 24 mcg at 08/07/18 2212 .  mometasone-formoterol (DULERA) 200-5 MCG/ACT inhaler 2 puff, 2 puff, Inhalation, BID, Shaune Pollackhen, Qing, MD, 2 puff at 08/07/18 2213 .  nitroGLYCERIN (NITROSTAT) SL tablet 0.4 mg, 0.4 mg, Sublingual, Q5 min PRN, Shaune Pollackhen, Qing, MD .  ondansetron [facility](ZOFRAN) tablet 4 mg, 4 mg, Oral, Q6H PRN **OR** ondansetron (ZOFRAN) injection 4 mg, 4 mg, Intravenous, Q6H PRN, Shaune Pollackhen, Qing, MD .  pantoprazole (PROTONIX) EC tablet 40 mg, 40 mg, Oral, Daily, Shaune Pollackhen, Qing, MD .  piperacillin-tazobactam (ZOSYN) IVPB 3.375 g, 3.375 g, Intravenous, Q8H, Shaune Pollackhen, Qing, MD, Last Rate: 12.5 mL/hr at 08/08/18 0603, 3.375 g at 08/08/18 0603 .  potassium chloride (KLOR-CON) CR tablet 8 mEq, 8 mEq, Oral, Daily, Shaune Pollackhen, Qing, MD .  risperiDONE (RISPERDAL) tablet 1 mg, 1 mg, Oral, BID, Shaune Pollackhen, Qing, MD, 1 mg at 08/07/18 2214 .  rOPINIRole (REQUIP) tablet 0.5 mg, 0.5 mg, Oral, QHS, Shaune Pollackhen, Qing, MD, 0.5 mg at 08/07/18 2211 .  senna-docusate (Senokot-S) tablet 1 tablet, 1 tablet, Oral, QHS PRN, Shaune Pollackhen, Qing, MD .  thiamine (VITAMIN B-1) tablet 100 mg, 100 mg, Oral, Daily, Shaune Pollackhen, Qing, MD .  vancomycin (VANCOCIN) IVPB 1000 mg/200 mL premix, 1,000 mg, Intravenous, Q24H, Shaune Pollackhen, Qing, MD .  vitamin B-12 (CYANOCOBALAMIN) tablet 1,000 mcg, 1,000 mcg, Oral, Daily, Shaune Pollackhen, Qing, MD .  vitamin E capsule 1,000 Units, 1,000 Units, Oral, Daily, Shaune Pollackhen, Qing, MD    ALLERGIES   Clindamycin/lincomycin; Iodine;  Stadol [butorphanol]; Darvon [propoxyphene]; Demerol [meperidine]; and Latex     REVIEW OF SYSTEMS    Review of Systems:  Gen:  Denies  fever, sweats, chills weigh loss  HEENT: Denies blurred vision, double vision, ear pain, eye pain, hearing loss, nose bleeds, sore throat Cardiac:  No dizziness, chest pain or heaviness, chest tightness,edema Resp:   Denies cough or sputum porduction, shortness of breath,wheezing, hemoptysis,  Gi: Denies swallowing difficulty, stomach pain, nausea or vomiting, diarrhea, constipation, bowel incontinence Gu:  Denies bladder incontinence, burning urine Ext:   Denies Joint pain, stiffness or swelling Skin: Denies  skin rash, easy  bruising or bleeding or hives Endoc:  Denies polyuria, polydipsia , polyphagia or weight change Psych:   Denies depression, insomnia or hallucinations   Other:  All other systems negative   VS: BP 102/80 (BP Location: Right Arm)   Pulse 79   Temp 37.1 C (Oral)   Resp 20   Ht  (1.473 m)   Wt 75 kg   SpO2 98%   BMI 34.56 kg/m      PHYSICAL EXAM    GENERAL:NAD, no fevers, chills, no weakness no fatigue HEAD: Normocephalic, atraumatic.  EYES: Pupils equal, round, reactive to light. Extraocular muscles intact. No scleral icterus.  MOUTH: Moist mucosal membrane. Dentition intact. No abscess noted.  EAR, NOSE, THROAT: Clear without exudates. No external lesions.  NECK: Supple. No thyromegaly. No nodules. No JVD.  PULMONARY: decreased breath sounds bilaterally, no wheezing, no crackles, mildly rhonchorous bilaterally.  CARDIOVASCULAR: S1 and S2. Regular rate and rhythm. No murmurs, rubs, or gallops. No edema. Pedal pulses 2+ bilaterally.  GASTROINTESTINAL: Soft, nontender, nondistended. No masses. Positive bowel sounds. No hepatosplenomegaly.  MUSCULOSKELETAL: No swelling, clubbing, or edema. Range of motion full in all extremities.  NEUROLOGIC: Cranial nerves II through XII are intact. No gross focal neurological  deficits. Sensation intact. Reflexes intact.  SKIN: No ulceration, lesions, rashes, or cyanosis. Skin warm and dry. Turgor intact.  PSYCHIATRIC: Mood, affect within normal limits. The patient is awake, alert and oriented x 3. Insight, judgment intact.       IMAGING    Dg Chest 2 View  Result Date: 08/07/2018 CLINICAL DATA:  Chest pain.  Bilateral leg swelling. EXAM: CHEST - 2 VIEW COMPARISON:  Radiograph of July 01, 2017. FINDINGS: Stable cardiomediastinal silhouette. Atherosclerosis of thoracic aorta is noted. Central pulmonary vascular congestion is noted. Minimal right basilar subsegmental atelectasis is noted. Probable lingular subsegmental atelectasis or infiltrate is noted. No pneumothorax or pleural effusion is noted. Bony thorax is unremarkable. IMPRESSION: Probable mild left lingular subsegmental atelectasis or infiltrate is noted. Minimal right basilar subsegmental atelectasis is noted. Stable cardiomegaly with central pulmonary vascular congestion. Aortic Atherosclerosis (ICD10-I70.0). Electronically Signed   By: Lupita Raider, M.D.   On: 08/07/2018 15:32   US Venous Img Lower Bilateral  Result Date: 07/31/2018 CLINICAL DATA:  Lower extremity swelling. EXAM: BILATERAL LOWER EXTREMITY VENOUS DOPPLER ULTRASOUND TECHNIQUE: Gray-scale sonography with graded compression, as well as color Doppler and duplex ultrasound were performed to evaluate the lower extremity deep venous systems from the level of the common femoral vein and including the common femoral, femoral, profunda femoral, popliteal and calf veins including the posterior tibial, peroneal and gastrocnemius veins when visible. The superficial great saphenous vein was also interrogated. Spectral Doppler was utilized to evaluate flow at rest and with distal augmentation maneuvers in the common femoral, femoral and popliteal veins. COMPARISON:  Bilateral lower extremity ultrasound 3 weeks prior 07/13/2018. FINDINGS: RIGHT LOWER  EXTREMITY Common Femoral Vein: No evidence of thrombus. Normal compressibility, respiratory phasicity and response to augmentation. Saphenofemoral Junction: No evidence of thrombus. Normal compressibility and flow on color Doppler imaging. Profunda Femoral Vein: No evidence of thrombus. Normal compressibility and flow on color Doppler imaging. Femoral Vein: No evidence of thrombus. Normal compressibility, respiratory phasicity and response to augmentation. Popliteal Vein: No evidence of thrombus. Normal compressibility, respiratory phasicity and response to augmentation. Calf Veins: No evidence of thrombus. Normal compressibility and flow on color Doppler imaging. Superficial Great Saphenous Vein: No evidence of thrombus. Normal compressibility. Venous Reflux:  None. Other Findings:  Subcutaneous soft tissue edema. LEFT LOWER EXTREMITY Common Femoral Vein: No evidence of thrombus. Normal compressibility, respiratory phasicity and response to augmentation. Saphenofemoral Junction: No evidence of thrombus. Normal compressibility and flow on color Doppler imaging. Profunda Femoral Vein: No evidence of thrombus. Normal compressibility and flow on color Doppler imaging. Femoral Vein: No evidence of thrombus. Normal compressibility, respiratory phasicity and response to augmentation. Popliteal Vein: No evidence of thrombus. Normal compressibility, respiratory phasicity and response to augmentation. Calf Veins: No evidence of thrombus. Normal compressibility and flow on color Doppler imaging. Superficial Great Saphenous Vein: No evidence of thrombus. Normal compressibility. Venous Reflux:  None. Other Findings:  Baker cyst measuring 5 x 1.6 x 3.0 cm. IMPRESSION: 1. No evidence of bilateral lower extremity deep venous thrombosis. 2. Subcutaneous soft tissue edema in the right lower extremity. Again seen left Baker cyst. Electronically Signed   By: Narda Rutherford M.D.   On: 07/31/2018 23:34   US Venous Img Lower  Bilateral  Result Date: 07/13/2018 CLINICAL DATA:  78 year old female with leg edema EXAM: BILATERAL LOWER EXTREMITY VENOUS DOPPLER ULTRASOUND TECHNIQUE: Gray-scale sonography with graded compression, as well as color Doppler and duplex ultrasound were performed to evaluate the lower extremity deep venous systems from the level of the common femoral vein and including the common femoral, femoral, profunda femoral, popliteal and calf veins including the posterior tibial, peroneal and gastrocnemius veins when visible. The superficial great saphenous vein was also interrogated. Spectral Doppler was utilized to evaluate flow at rest and with distal augmentation maneuvers in the common femoral, femoral and popliteal veins. COMPARISON:  None. FINDINGS: RIGHT LOWER EXTREMITY Common Femoral Vein: No evidence of thrombus. Normal compressibility, respiratory phasicity and response to augmentation. Saphenofemoral Junction: No evidence of thrombus. Normal compressibility and flow on color Doppler imaging. Profunda Femoral Vein: No evidence of thrombus. Normal compressibility and flow on color Doppler imaging. Femoral Vein: No evidence of thrombus. Normal compressibility, respiratory phasicity and response to augmentation. Popliteal Vein: No evidence of thrombus. Normal compressibility, respiratory phasicity and response to augmentation. Calf Veins: No evidence of thrombus. Normal compressibility and flow on color Doppler imaging. Superficial Great Saphenous Vein: No evidence of thrombus. Normal compressibility and flow on color Doppler imaging. Other Findings:  Soft tissue edema LEFT LOWER EXTREMITY Common Femoral Vein: No evidence of thrombus. Normal compressibility, respiratory phasicity and response to augmentation. Saphenofemoral Junction: No evidence of thrombus. Normal compressibility and flow on color Doppler imaging. Profunda Femoral Vein: No evidence of thrombus. Normal compressibility and flow on color Doppler  imaging. Femoral Vein: No evidence of thrombus. Normal compressibility, respiratory phasicity and response to augmentation. Popliteal Vein: No evidence of thrombus. Normal compressibility, respiratory phasicity and response to augmentation. Calf Veins: No evidence of thrombus. Normal compressibility and flow on color Doppler imaging. Superficial Great Saphenous Vein: No evidence of thrombus. Normal compressibility and flow on color Doppler imaging. Other Findings: Lentiform fluid collection the left popliteal region measures 5.0 cm x 0.9 cm x 2.8 cm IMPRESSION: Sonographic survey of the bilateral lower extremities negative for DVT. Left-sided Baker's cyst. Edema, particularly on the right Electronically Signed   By: Gilmer Mor D.O.   On: 07/13/2018 14:13      ASSESSMENT/PLAN   Moderate COPD exacerbation with acute on chronic hypoxemic respiratory failure.      -Noted patient on IV zosyn/vanco for cellulitis as well as 20 bid Lasix      - continue DUonebs as current      - will add 40 prednisone once daily       -  chest physiotherapy with VEST      -incentive spirometer for bilateral atelectasis worse at lingular segments      - will make office appointment to be seen by Dr Mayo Ao post hospitalization.       -PT/OT to optimize for d/c and help combat atelectatic segments    Thank you for allowing me to participate in the care of this patient.   Patient/Family are satisfied with care plan and all questions have been answered.  This document was prepared using Dragon voice recognition software and may include unintentional dictation errors.     Vida Rigger, M.D.  Division of Pulmonary & Critical Care Medicine  [facility]

## 2018-08-08 NOTE — Progress Notes (Signed)
*  PRELIMINARY RESULTS* Echocardiogram 2D Echocardiogram has been performed.  Joanette Gula Cherilyn Sautter 08/08/2018, 11:01 AM

## 2018-08-08 NOTE — Clinical Social Work Note (Signed)
CSW received referral for SNF.  Case discussed with case manager and plan is to discharge home with home health.  CSW to sign off please re-consult if social work needs arise.  Jaking Thayer R. Vivienne Sangiovanni, MSW, LCSW [phone]  

## 2018-08-08 NOTE — Care Management (Signed)
Contacted Jason with [facility] requesting to accept the patient for [facility] PT via secure email

## 2018-08-08 NOTE — TOC Initial Note (Signed)
Transition of Care [facility]) - Initial/Assessment Note    Patient Details  Name: Kristen Watts MRN: [id number] Date of Birth: 1941/01/11  Transition of Care [facility] CM/SW Contact:    Barrie Dunker, RN Phone Number: 08/08/2018, 3:22 PM  Clinical Narrative:                   Expected Discharge Plan: Home w Home Health Services Barriers to Discharge: No Barriers Identified   Patient Goals and CMS Choice Patient states their goals for this hospitalization and ongoing recovery are:: go home CMS Medicare.gov Compare Post Acute Care list provided to:: Patient Choice offered to / list presented to : Patient  Expected Discharge Plan and Services Expected Discharge Plan: Home w Home Health Services   Post Acute Care Choice: Home Health Living arrangements for the past 2 months: Apartment                 DME Arranged: 3-N-1, Walker rolling DME Agency: [employer] HH Arranged: PT    Prior Living Arrangements/Services Living arrangements for the past 2 months: Apartment Lives with:: Self Patient language and need for interpreter reviewed:: Yes Do you feel safe going back to the place where you live?: Yes      Need for Family Participation in Patient Care: No (Comment) Care giver support system in place?: Yes (comment)   Criminal Activity/Legal Involvement Pertinent to Current Situation/Hospitalization: No - Comment as needed  Activities of Daily Living Home Assistive Devices/Equipment: None ADL Screening (condition at time of admission) Patient's cognitive ability adequate to safely complete daily activities?: Yes Is the patient deaf or have difficulty hearing?: No Does the patient have difficulty seeing, even when wearing glasses/contacts?: No Does the patient have difficulty concentrating, remembering, or making decisions?: Yes Patient able to express need for assistance with ADLs?: Yes Does the patient have difficulty dressing or bathing?: No Independently performs ADLs?: Yes  (appropriate for developmental age) Does the patient have difficulty walking or climbing stairs?: Yes Weakness of Legs: Both Weakness of Arms/Hands: None  Permission Sought/Granted Permission sought to share information with : Case Manager Permission granted to share information with : Yes, Verbal Permission Granted     Permission granted to share info w AGENCY: any agency for [facility]        Emotional Assessment Appearance:: Appears older than stated age Attitude/Demeanor/Rapport: Gracious, Engaged Affect (typically observed): Accepting Orientation: : Oriented to Self, Oriented to Place, Oriented to  Time, Oriented to Situation Alcohol / Substance Use: Never Used Psych Involvement: No (comment)  Admission diagnosis:  Cellulitis, unspecified cellulitis site [L03.90] Patient Active Problem List   Diagnosis Date Noted  . Bilateral lower leg cellulitis 08/07/2018  . Acute lower UTI 04/29/2017  . Acute bronchitis 04/29/2017  . Varicose veins of bilateral lower extremities with pain 02/01/2017  . Diabetes (HCC) 08/20/2016  . Essential hypertension, benign 08/20/2016  . COPD (chronic obstructive pulmonary disease) (HCC) 08/20/2016  . Pain in limb 08/20/2016  . Swelling of limb 08/20/2016  . Syncope 07/01/2016   PCP:  Barbette Reichmann, MD Pharmacy:   [facility] [place] [address] [place] [address] Phone: [phone] Fax: [phone]     Social Determinants of Health (SDOH) Interventions    Readmission Risk Interventions 30 Day Unplanned Readmission Risk Score     ED to Hosp-Admission (Current) from 08/07/2018 in [facility] (1A)  30 Day Unplanned Readmission  Risk Score (%)  21 Filed at 08/08/2018 1200     This score is the patient's risk of an unplanned readmission within 30 days of being discharged (0 -100%). The score is based on dignosis, age, lab data, medications, orders, and past  utilization.   Low:  0-14.9   Medium: 15-21.9   High: 22-29.9   Extreme: 30 and above       No flowsheet data found.

## 2018-08-08 NOTE — Evaluation (Signed)
Physical Therapy Evaluation Patient Details Name: Kristen Watts MRN: [id number] DOB: Jun 08, 1940 Today's Date: 08/08/2018   History of Present Illness  Patient is a 78 year old female admitted with B LE redness, swelling. B LE cellulitis. PMH to include: ADD, CAD, asthma, CHF, COPD, depression, DM, GERD, HLD, HTN, OA  Clinical Impression  Patient received sleeping, easily aroused. Agrees to PT evaluation. Patient reports swelling in LEs feels better because her legs don't feel heavy anymore. Upon inspection, patient reports swelling is much better and legs not as red. Patient performs bed mobility with supervision, increased time and use of bed rails. She is unsteady initially with walking in room, reaching for walls and counters. As distance increased patient steadiness improved. She ambulated 50 feet in room with supervision. Slow cadence. Patient will benefit from continued skilled PT while here to improve strength and functional mobility so she can return home at discharge.           Follow Up Recommendations Home health PT, Home Heath RN    Equipment Recommendations  None recommended by PT    Recommendations for Other Services       Precautions / Restrictions Precautions Precautions: Fall Restrictions Weight Bearing Restrictions: No      Mobility  Bed Mobility Overal bed mobility: Modified Independent             General bed mobility comments: increased time, use of bed rails  Transfers Overall transfer level: Needs assistance   Transfers: Sit to/from Stand Sit to Stand: Supervision         General transfer comment: transfers Sit >< stand with supervision only,    Ambulation/Gait Ambulation/Gait assistance: Supervision Gait Distance (Feet): 50 Feet Assistive device: None   Gait velocity: decreased   General Gait Details: patient unsteady initially with ambulation, improved with increased distance. Patient initially reaching for walls, counters etc, but then  able to ambulate without UE assist and supervision only.   Stairs            Wheelchair Mobility    Modified Rankin (Stroke Patients Only)       Balance Overall balance assessment: Mild deficits observed, not formally tested                                           Pertinent Vitals/Pain Pain Assessment: 0-10 Pain Score: 2  Pain Descriptors / Indicators: Aching;Tightness Pain Intervention(s): Limited activity within patient's tolerance;Monitored during session    Home Living Family/patient expects to be discharged to:: Private residence Living Arrangements: Alone Available Help at Discharge: Available PRN/intermittently Type of Home: Apartment Home Access: Level entry       Home Equipment: Walker - 2 wheels      Prior Function           Comments: patient reports she did not use AD in the home.      Hand Dominance        Extremity/Trunk Assessment   Upper Extremity Assessment Upper Extremity Assessment: Overall WFL for tasks assessed    Lower Extremity Assessment Lower Extremity Assessment: Overall WFL for tasks assessed       Communication   Communication: No difficulties  Cognition Arousal/Alertness: Awake/alert Behavior During Therapy: WFL for tasks assessed/performed Overall Cognitive Status: Within Functional Limits for tasks assessed  General Comments      Exercises     Assessment/Plan    PT Assessment Patient needs continued PT services  PT Problem List Decreased strength;Decreased balance;Decreased cognition;Pain;Decreased mobility;Decreased knowledge of use of DME;Cardiopulmonary status limiting activity;Obesity;Decreased activity tolerance;Decreased safety awareness       PT Treatment Interventions DME instruction;Functional mobility training;Balance training;Patient/family education;Gait training;Therapeutic activities;Neuromuscular  re-education;Therapeutic exercise    PT Goals (Current goals can be found in the Care Plan section)  Acute Rehab PT Goals Patient Stated Goal: to return to her apartment, improve swelling PT Goal Formulation: With patient Time For Goal Achievement: 08/22/18 Potential to Achieve Goals: Good    Frequency Min 2X/week   Barriers to discharge Decreased caregiver support      Co-evaluation               AM-PAC PT "6 Clicks" Mobility  Outcome Measure Help needed turning from your back to your side while in a flat bed without using bedrails?: A Little Help needed moving from lying on your back to sitting on the side of a flat bed without using bedrails?: A Little Help needed moving to and from a bed to a chair (including a wheelchair)?: A Little Help needed standing up from a chair using your arms (e.g., wheelchair or bedside chair)?: A Little Help needed to walk in hospital room?: A Little Help needed climbing 3-5 steps with a railing? : A Little 6 Click Score: 18    End of Session Equipment Utilized During Treatment: Gait belt;Oxygen Activity Tolerance: Patient tolerated treatment well Patient left: in bed;with call bell/phone within reach;with bed alarm set Nurse Communication: Mobility status PT Visit Diagnosis: Unsteadiness on feet (R26.81);Muscle weakness (generalized) (M62.81);Difficulty in walking, not elsewhere classified (R26.2);Pain Pain - Right/Left: (bilateral) Pain - part of body: Leg    Time: 1235-1300 PT Time Calculation (min) (ACUTE ONLY): 25 min   Charges:   PT Evaluation $PT Eval Moderate Complexity: 1 Mod PT Treatments $Gait Training: 8-22 mins        Lansing Sigmon, PT, GCS 08/08/18,1:16 PM

## 2018-08-08 NOTE — Progress Notes (Signed)
[facility] - [place] at [facility]   PATIENT NAME: Kristen Watts    MR#:  [id number]  DATE OF BIRTH:  March 08, 1941  SUBJECTIVE:  CHIEF COMPLAINT:   Chief Complaint  Patient presents with  . Leg Swelling   No new complaint this morning.  No fevers.  Patient admitted and being managed for bilateral lower extremity cellulitis and edema.  REVIEW OF SYSTEMS:  Review of Systems  Constitutional: Negative for chills and fever.  HENT: Negative for hearing loss and tinnitus.   Eyes: Negative for blurred vision and double vision.  Respiratory: Positive for cough and shortness of breath.   Cardiovascular: Negative for chest pain and palpitations.  Gastrointestinal: Negative for heartburn and nausea.  Genitourinary: Negative for dysuria and urgency.  Musculoskeletal: Negative for back pain and myalgias.       Redness and swelling on both lower extremity  Skin: Negative for itching.       Chronic appearing nonpruritic skin rash  Neurological: Negative for dizziness and headaches.  Psychiatric/Behavioral: Negative for depression and hallucinations.    DRUG ALLERGIES:   Allergies  Allergen Reactions  . Clindamycin/Lincomycin   . Iodine Swelling  . Stadol [Butorphanol] Other (See Comments)    LOC  . Darvon [Propoxyphene] Rash  . Demerol [Meperidine] Palpitations  . Latex Rash   VITALS:  Blood pressure 102/80, pulse 81, temperature 98.7 F (37.1 C), temperature source Oral, resp. rate 18, height 4\' 10"  (1.473 m), weight 75 kg, SpO2 96 %. PHYSICAL EXAMINATION:  Physical Exam  Constitutional: She is oriented to person, place, and time. She appears well-developed and well-nourished.  HENT:  Head: Normocephalic and atraumatic.  Mouth/Throat: Oropharynx is clear and moist.  Eyes: Pupils are equal, round, and reactive to light. Conjunctivae and EOM are normal. Right eye exhibits no discharge.  Neck: Normal range of motion. No tracheal deviation present. No thyromegaly  present.  Cardiovascular: Normal rate, regular rhythm and normal heart sounds.  Respiratory: Effort normal and breath sounds normal. No respiratory distress.  GI: Soft. Bowel sounds are normal. There is no abdominal tenderness.  Musculoskeletal: Normal range of motion.        General: Edema present.     Comments: Redness and swelling in both lower extremity.  Neurological: She is alert and oriented to person, place, and time.  Skin: Skin is warm. She is not diaphoretic.  Redness and swelling on both lower extremity.  Psychiatric: She has a normal mood and affect. Her behavior is normal.    LABORATORY PANEL:  Female CBC Recent Labs  Lab 08/08/18 0500  WBC 9.0  HGB 9.1*  HCT 31.9*  PLT 179   ------------------------------------------------------------------------------------------------------------------ Chemistries  Recent Labs  Lab 08/07/18 1415 08/08/18 0500  NA 138 140  K 3.9 3.9  CL 104 104  CO2 25 28  GLUCOSE 147* 129*  BUN 18 14  CREATININE 0.71 0.79  CALCIUM 8.8* 8.5*  MG 2.4  --    RADIOLOGY:  Dg Chest 2 View  Result Date: 08/07/2018 CLINICAL DATA:  Chest pain.  Bilateral leg swelling. EXAM: CHEST - 2 VIEW COMPARISON:  Radiograph of July 01, 2017. FINDINGS: Stable cardiomediastinal silhouette. Atherosclerosis of thoracic aorta is noted. Central pulmonary vascular congestion is noted. Minimal right basilar subsegmental atelectasis is noted. Probable lingular subsegmental atelectasis or infiltrate is noted. No pneumothorax or pleural effusion is noted. Bony thorax is unremarkable. IMPRESSION: Probable mild left lingular subsegmental atelectasis or infiltrate is noted. Minimal right basilar subsegmental atelectasis is noted.  Stable cardiomegaly with central pulmonary vascular congestion. Aortic Atherosclerosis (ICD10-I70.0). Electronically Signed   By: Lupita Raider, M.D.   On: 08/07/2018 15:32   ASSESSMENT AND PLAN:   1. Bilateral leg cellulitis. Patient  admitted to medical floor.  Patient currently on broad-spectrum IV antibiotics with IV vancomycin and Zosyn.  Failed outpatient antibiotics with doxycycline Monitor and de-escalate antibiotics soon based on clinical improvement. Venous ultrasound of bilateral leg this month did not show any DVT.  Clinically findings more consistent with cellulitis  2.  Bilateral lower extremity edema.   Patient already placed on gentle diuresis with IV Lasix.   2D echocardiogram to evaluate cardiac function today.  Monitor clinically   3.  Moderate COPD exacerbation   Seen by pulmonologist.  Appreciate input.   To continue antibiotics as above.  DuoNeb.  Prednisone 40 mg daily added.  Chest physiotherapy.  Incentive spirometer.   PT consult placed to help improve atelectatic segments.    4.  Chronic hypoxic respiratory failure Stable.  Patient uses 4 L of home oxygen therapy.  Continue the same.  5.  Diabetes mellitus type 2. Sliding scale insulin coverage.  Metformin on hold.  DVT prophylaxis; Lovenox    All the records are reviewed and case discussed with Care Management/Social Worker. Management plans discussed with the patient, family and they are in agreement.  CODE STATUS: Full Code  TOTAL TIME TAKING CARE OF THIS PATIENT: 28 minutes.   More than 50% of the time was spent in counseling/coordination of care: YES  POSSIBLE D/C IN 1-2 DAYS, DEPENDING ON CLINICAL CONDITION.   Merlon Alcorta M.D on 08/08/2018 at 1:54 PM  Between 7am to 6pm - Pager - [phone]  After 6pm go to [email or web address] - Social research officer, government  [facility] [place] [facility]  Office  [phone]  CC: Primary care physician; Barbette Reichmann, MD  Note: This dictation was prepared with Dragon dictation along with smaller phrase technology. Any transcriptional errors that result from this process are unintentional.

## 2018-08-09 ENCOUNTER — Encounter: Payer: Self-pay | Admitting: Pulmonary Disease

## 2018-08-09 LAB — BASIC METABOLIC PANEL
Anion gap: 7 (ref 5–15)
BUN: 22 mg/dL (ref 8–23)
CHLORIDE: 103 mmol/L (ref 98–111)
CO2: 28 mmol/L (ref 22–32)
Calcium: 8.9 mg/dL (ref 8.9–10.3)
Creatinine, Ser: 0.81 mg/dL (ref 0.44–1.00)
GFR calc Af Amer: >60 mL/min (ref >60)
GFR calc non Af Amer: >60 mL/min (ref >60)
Glucose, Bld: 137 mg/dL — ABNORMAL HIGH (ref 70–99)
POTASSIUM: 4.2 mmol/L (ref 3.5–5.1)
Sodium: 138 mmol/L (ref 135–145)

## 2018-08-09 LAB — GLUCOSE, CAPILLARY
Glucose-Capillary: 127 mg/dL — ABNORMAL HIGH (ref 70–99)
Glucose-Capillary: 138 mg/dL — ABNORMAL HIGH (ref 70–99)
Glucose-Capillary: 171 mg/dL — ABNORMAL HIGH (ref 70–99)
Glucose-Capillary: 171 mg/dL — ABNORMAL HIGH (ref 70–99)

## 2018-08-09 LAB — MAGNESIUM: Magnesium: 2.3 mg/dL (ref 1.7–2.4)

## 2018-08-09 NOTE — Progress Notes (Signed)
Pulmonary Medicine          Date: 08/09/2018,   MRN# [id number] Kristen Watts 28-Feb-1941     AdmissionWeight: 75 kg                 CurrentWeight: 75 kg       SUBJUECTIVE   Patient breathing is much improved.  Reports chest physiotherapy has helped her to clear phlegm and now able to take deep breaths.   PAST MEDICAL HISTORY   Past Medical History:  Diagnosis Date   ADD (attention deficit disorder)    Arthritis    ASCVD (arteriosclerotic cardiovascular disease)    Asthma    Blood transfusion without reported diagnosis    CHF (congestive heart failure) (HCC)    COPD (chronic obstructive pulmonary disease) (HCC)    Coronary artery disease    Depression    Diabetes mellitus without complication (HCC)    Eczema    GERD (gastroesophageal reflux disease)    Hematuria    Hyperlipidemia    Hypertension    Obesity    Osteoarthritis    Panic attacks    Phlebitis    Pulmonary embolism (HCC)    PVD (peripheral vascular disease) (HCC)    Sleep apnea    Stroke (HCC)    Thyroid disease      SURGICAL HISTORY   Past Surgical History:  Procedure Laterality Date   ABDOMINAL HYSTERECTOMY     BACK SURGERY     BREAST BIOPSY Right    neg   BREAST SURGERY     CARDIAC SURGERY     CHOLECYSTECTOMY     ESOPHAGOGASTRODUODENOSCOPY (EGD) WITH PROPOFOL N/A 12/09/2016   Procedure: ESOPHAGOGASTRODUODENOSCOPY (EGD) WITH PROPOFOL;  Surgeon: Christena Deem, MD;  Location: [facility] ENDOSCOPY;  Service: Endoscopy;  Laterality: N/A;   FRACTURE SURGERY     TONSILLECTOMY       FAMILY HISTORY   Family History  Problem Relation Age of Onset   CAD Father    Breast cancer Neg Hx      SOCIAL HISTORY   Social History   Tobacco Use   Smoking status: Former Smoker    Types: Cigarettes   Smokeless tobacco: Never Used  Substance Use Topics   Alcohol use: No   Drug use: No     MEDICATIONS    Home Medication:    Current  Medication:  Current Facility-Administered Medications:    acetaminophen (TYLENOL) tablet 650 mg, 650 mg, Oral, Q6H PRN **OR** acetaminophen (TYLENOL) suppository 650 mg, 650 mg, Rectal, Q6H PRN, Shaune Pollack, MD   albuterol (PROVENTIL) (2.5 MG/3ML) 0.083% nebulizer solution 2.5 mg, 2.5 mg, Nebulization, Q2H PRN, Shaune Pollack, MD, 2.5 mg at 08/07/18 2334   ALPRAZolam Prudy Feeler) tablet 0.25 mg, 0.25 mg, Oral, QHS PRN, Shaune Pollack, MD, 0.25 mg at 08/07/18 2330   atomoxetine (STRATTERA) capsule 40 mg, 40 mg, Oral, Daily, Shaune Pollack, MD, 40 mg at 08/09/18 0956   bisacodyl (DULCOLAX) EC tablet 5 mg, 5 mg, Oral, Daily PRN, Shaune Pollack, MD   carvedilol (COREG) tablet 6.25 mg, 6.25 mg, Oral, BID WC, Shaune Pollack, MD, 6.25 mg at 08/09/18 7829   celecoxib (CELEBREX) capsule 200 mg, 200 mg, Oral, Daily, Shaune Pollack, MD, 200 mg at 08/09/18 5621   cholecalciferol (VITAMIN D3) tablet 1,000 Units, 1,000 Units, Oral, Daily, Shaune Pollack, MD, 1,000 Units at 08/09/18 0954   citalopram (CELEXA) tablet 10 mg, 10 mg, Oral, Daily, Shaune Pollack, MD, 10 mg at 08/09/18 [phone]  cyclobenzaprine (FLEXERIL) tablet 5 mg, 5 mg, Oral, BID PRN, Shaune Pollack, MD   enoxaparin (LOVENOX) injection 40 mg, 40 mg, Subcutaneous, Q24H, Shaune Pollack, MD, 40 mg at 08/08/18 2054   ferrous sulfate tablet 325 mg, 325 mg, Oral, Q breakfast, Shaune Pollack, MD, 325 mg at 08/09/18 1610   furosemide (LASIX) injection 20 mg, 20 mg, Intravenous, Q12H, Shaune Pollack, MD, 20 mg at 08/09/18 0956   insulin aspart (novoLOG) injection 0-5 Units, 0-5 Units, Subcutaneous, QHS, Shaune Pollack, MD   insulin aspart (novoLOG) injection 0-9 Units, 0-9 Units, Subcutaneous, TID WC, Shaune Pollack, MD, 1 Units at 08/09/18 1215   ipratropium-albuterol (DUONEB) 0.5-2.5 (3) MG/3ML nebulizer solution 3 mL, 3 mL, Inhalation, QID, Shaune Pollack, MD, 3 mL at 08/09/18 1122   levothyroxine (SYNTHROID, LEVOTHROID) tablet 150 mcg, 150 mcg, Oral, q morning - 10a, Shaune Pollack, MD, 150 mcg at 08/09/18  9604   lubiprostone (AMITIZA) capsule 24 mcg, 24 mcg, Oral, BID, Shaune Pollack, MD, 24 mcg at 08/09/18 0955   mometasone-formoterol (DULERA) 200-5 MCG/ACT inhaler 2 puff, 2 puff, Inhalation, BID, Shaune Pollack, MD, 2 puff at 08/09/18 0956   nitroGLYCERIN (NITROSTAT) SL tablet 0.4 mg, 0.4 mg, Sublingual, Q5 min PRN, Shaune Pollack, MD   ondansetron [facility]) tablet 4 mg, 4 mg, Oral, Q6H PRN **OR** ondansetron (ZOFRAN) injection 4 mg, 4 mg, Intravenous, Q6H PRN, Shaune Pollack, MD   pantoprazole (PROTONIX) EC tablet 40 mg, 40 mg, Oral, Daily, Shaune Pollack, MD, 40 mg at 08/09/18 0954   piperacillin-tazobactam (ZOSYN) IVPB 3.375 g, 3.375 g, Intravenous, Q8H, Shaune Pollack, MD, Last Rate: 12.5 mL/hr at 08/09/18 0510, 3.375 g at 08/09/18 0510   potassium chloride (KLOR-CON) CR tablet 8 mEq, 8 mEq, Oral, Daily, Shaune Pollack, MD, 8 mEq at 08/09/18 1215   predniSONE (DELTASONE) tablet 40 mg, 40 mg, Oral, Q breakfast, Karna Christmas, Marynell Bies, MD, 40 mg at 08/09/18 0954   risperiDONE (RISPERDAL) tablet 1 mg, 1 mg, Oral, BID, Shaune Pollack, MD, 1 mg at 08/09/18 0955   rOPINIRole (REQUIP) tablet 0.5 mg, 0.5 mg, Oral, QHS, Imogene Burn, Alfredo Batty, MD, 0.5 mg at 08/08/18 2055   senna-docusate (Senokot-S) tablet 1 tablet, 1 tablet, Oral, QHS PRN, Shaune Pollack, MD   thiamine (VITAMIN B-1) tablet 100 mg, 100 mg, Oral, Daily, Shaune Pollack, MD, 100 mg at 08/09/18 5409   vancomycin (VANCOCIN) IVPB 1000 mg/200 mL premix, 1,000 mg, Intravenous, Q24H, [place], Jude, MD, Last Rate: 200 mL/hr at 08/08/18 2119, 1,000 mg at 08/08/18 2119   vitamin B-12 (CYANOCOBALAMIN) tablet 1,000 mcg, 1,000 mcg, Oral, Daily, Shaune Pollack, MD, 1,000 mcg at 08/09/18 8119   vitamin E capsule 1,000 Units, 1,000 Units, Oral, Daily, Shaune Pollack, MD, 1,000 Units at 08/09/18 0954    ALLERGIES   Clindamycin/lincomycin; Iodine; Stadol [butorphanol]; Darvon [propoxyphene]; Demerol [meperidine]; and Latex     REVIEW OF SYSTEMS    Review of Systems:  Gen:  Denies  fever, sweats,  chills weigh loss  HEENT: Denies blurred vision, double vision, ear pain, eye pain, hearing loss, nose bleeds, sore throat Cardiac:  No dizziness, chest pain or heaviness, chest tightness,edema Resp:   Denies cough or sputum porduction, shortness of breath,wheezing, hemoptysis,  Gi: Denies swallowing difficulty, stomach pain, nausea or vomiting, diarrhea, constipation, bowel incontinence Gu:  Denies bladder incontinence, burning urine Ext:   Denies Joint pain, stiffness or swelling Skin: Denies  skin rash, easy bruising or bleeding or hives Endoc:  Denies polyuria, polydipsia , polyphagia or weight change Psych:   Denies depression, insomnia  or hallucinations   Other:  All other systems negative   VS: BP (!) 106/45 (BP Location: Right Arm)    Pulse 77    Temp 36.8 C (Oral)    Resp 16    Ht  (1.473 m)    Wt 75 kg    SpO2 98%    BMI 34.56 kg/m      PHYSICAL EXAM    GENERAL:NAD, no fevers, chills, no weakness no fatigue HEAD: Normocephalic, atraumatic.  EYES: Pupils equal, round, reactive to light. Extraocular muscles intact. No scleral icterus.  MOUTH: Moist mucosal membrane. Dentition intact. No abscess noted.  EAR, NOSE, THROAT: Clear without exudates. No external lesions.  NECK: Supple. No thyromegaly. No nodules. No JVD.  PULMONARY: Clear to auscultation bilaterally CARDIOVASCULAR: S1 and S2. Regular rate and rhythm. No murmurs, rubs, or gallops. No edema. Pedal pulses 2+ bilaterally.  GASTROINTESTINAL: Soft, nontender, nondistended. No masses. Positive bowel sounds. No hepatosplenomegaly.  MUSCULOSKELETAL: No swelling, clubbing, or edema. Range of motion full in all extremities.  NEUROLOGIC: Cranial nerves II through XII are intact. No gross focal neurological deficits. Sensation intact. Reflexes intact.  SKIN: No ulceration, lesions, rashes, or cyanosis. Skin warm and dry. Turgor intact.  PSYCHIATRIC: Mood, affect within normal limits. The patient is awake, alert and  oriented x 3. Insight, judgment intact.       IMAGING    Dg Chest 2 View  Result Date: 08/07/2018 CLINICAL DATA:  Chest pain.  Bilateral leg swelling. EXAM: CHEST - 2 VIEW COMPARISON:  Radiograph of July 01, 2017. FINDINGS: Stable cardiomediastinal silhouette. Atherosclerosis of thoracic aorta is noted. Central pulmonary vascular congestion is noted. Minimal right basilar subsegmental atelectasis is noted. Probable lingular subsegmental atelectasis or infiltrate is noted. No pneumothorax or pleural effusion is noted. Bony thorax is unremarkable. IMPRESSION: Probable mild left lingular subsegmental atelectasis or infiltrate is noted. Minimal right basilar subsegmental atelectasis is noted. Stable cardiomegaly with central pulmonary vascular congestion. Aortic Atherosclerosis (ICD10-I70.0). Electronically Signed   By: Lupita Raider, M.D.   On: 08/07/2018 15:32   US Venous Img Lower Bilateral  Result Date: 07/31/2018 CLINICAL DATA:  Lower extremity swelling. EXAM: BILATERAL LOWER EXTREMITY VENOUS DOPPLER ULTRASOUND TECHNIQUE: Gray-scale sonography with graded compression, as well as color Doppler and duplex ultrasound were performed to evaluate the lower extremity deep venous systems from the level of the common femoral vein and including the common femoral, femoral, profunda femoral, popliteal and calf veins including the posterior tibial, peroneal and gastrocnemius veins when visible. The superficial great saphenous vein was also interrogated. Spectral Doppler was utilized to evaluate flow at rest and with distal augmentation maneuvers in the common femoral, femoral and popliteal veins. COMPARISON:  Bilateral lower extremity ultrasound 3 weeks prior 07/13/2018. FINDINGS: RIGHT LOWER EXTREMITY Common Femoral Vein: No evidence of thrombus. Normal compressibility, respiratory phasicity and response to augmentation. Saphenofemoral Junction: No evidence of thrombus. Normal compressibility and flow on  color Doppler imaging. Profunda Femoral Vein: No evidence of thrombus. Normal compressibility and flow on color Doppler imaging. Femoral Vein: No evidence of thrombus. Normal compressibility, respiratory phasicity and response to augmentation. Popliteal Vein: No evidence of thrombus. Normal compressibility, respiratory phasicity and response to augmentation. Calf Veins: No evidence of thrombus. Normal compressibility and flow on color Doppler imaging. Superficial Great Saphenous Vein: No evidence of thrombus. Normal compressibility. Venous Reflux:  None. Other Findings:  Subcutaneous soft tissue edema. LEFT LOWER EXTREMITY Common Femoral Vein: No evidence of thrombus. Normal compressibility, respiratory phasicity and response to  augmentation. Saphenofemoral Junction: No evidence of thrombus. Normal compressibility and flow on color Doppler imaging. Profunda Femoral Vein: No evidence of thrombus. Normal compressibility and flow on color Doppler imaging. Femoral Vein: No evidence of thrombus. Normal compressibility, respiratory phasicity and response to augmentation. Popliteal Vein: No evidence of thrombus. Normal compressibility, respiratory phasicity and response to augmentation. Calf Veins: No evidence of thrombus. Normal compressibility and flow on color Doppler imaging. Superficial Great Saphenous Vein: No evidence of thrombus. Normal compressibility. Venous Reflux:  None. Other Findings:  Baker cyst measuring 5 x 1.6 x 3.0 cm. IMPRESSION: 1. No evidence of bilateral lower extremity deep venous thrombosis. 2. Subcutaneous soft tissue edema in the right lower extremity. Again seen left Baker cyst. Electronically Signed   By: Narda Rutherford M.D.   On: 07/31/2018 23:34   US Venous Img Lower Bilateral  Result Date: 07/13/2018 CLINICAL DATA:  78 year old female with leg edema EXAM: BILATERAL LOWER EXTREMITY VENOUS DOPPLER ULTRASOUND TECHNIQUE: Gray-scale sonography with graded compression, as well as color  Doppler and duplex ultrasound were performed to evaluate the lower extremity deep venous systems from the level of the common femoral vein and including the common femoral, femoral, profunda femoral, popliteal and calf veins including the posterior tibial, peroneal and gastrocnemius veins when visible. The superficial great saphenous vein was also interrogated. Spectral Doppler was utilized to evaluate flow at rest and with distal augmentation maneuvers in the common femoral, femoral and popliteal veins. COMPARISON:  None. FINDINGS: RIGHT LOWER EXTREMITY Common Femoral Vein: No evidence of thrombus. Normal compressibility, respiratory phasicity and response to augmentation. Saphenofemoral Junction: No evidence of thrombus. Normal compressibility and flow on color Doppler imaging. Profunda Femoral Vein: No evidence of thrombus. Normal compressibility and flow on color Doppler imaging. Femoral Vein: No evidence of thrombus. Normal compressibility, respiratory phasicity and response to augmentation. Popliteal Vein: No evidence of thrombus. Normal compressibility, respiratory phasicity and response to augmentation. Calf Veins: No evidence of thrombus. Normal compressibility and flow on color Doppler imaging. Superficial Great Saphenous Vein: No evidence of thrombus. Normal compressibility and flow on color Doppler imaging. Other Findings:  Soft tissue edema LEFT LOWER EXTREMITY Common Femoral Vein: No evidence of thrombus. Normal compressibility, respiratory phasicity and response to augmentation. Saphenofemoral Junction: No evidence of thrombus. Normal compressibility and flow on color Doppler imaging. Profunda Femoral Vein: No evidence of thrombus. Normal compressibility and flow on color Doppler imaging. Femoral Vein: No evidence of thrombus. Normal compressibility, respiratory phasicity and response to augmentation. Popliteal Vein: No evidence of thrombus. Normal compressibility, respiratory phasicity and response  to augmentation. Calf Veins: No evidence of thrombus. Normal compressibility and flow on color Doppler imaging. Superficial Great Saphenous Vein: No evidence of thrombus. Normal compressibility and flow on color Doppler imaging. Other Findings: Lentiform fluid collection the left popliteal region measures 5.0 cm x 0.9 cm x 2.8 cm IMPRESSION: Sonographic survey of the bilateral lower extremities negative for DVT. Left-sided Baker's cyst. Edema, particularly on the right Electronically Signed   By: Gilmer Mor D.O.   On: 07/13/2018 14:13      ASSESSMENT/PLAN   Moderate COPD exacerbation with acute on chronic hypoxemic respiratory failure.      -Noted patient on IV zosyn/vanco for cellulitis as well as 20 bid Lasix      - continue DUonebs as current      - continue pred 40 mg       - chest physiotherapy with flutter valve- discussed with RT Bambi Lima -patient was able to expectorate thick  phlegm post tx.       -incentive spirometer for bilateral atelectasis worse at lingular segments      - will make office appointment to be seen by Dr Mayo Ao post hospitalization.       -PT/OT to optimize for d/c and help combat atelectatic segments      Thank you for allowing me to participate in the care of this patient.     Patient/Family are satisfied with care plan and all questions have been answered.  This document was prepared using Dragon voice recognition software and may include unintentional dictation errors.     Vida Rigger, M.D.  Division of Pulmonary & Critical Care Medicine  [facility]

## 2018-08-09 NOTE — Plan of Care (Signed)
Swelling and redness has improved. Pt reports legs feel better.

## 2018-08-09 NOTE — Progress Notes (Signed)
Pt accidentally pulled IV out. New IV order placed.

## 2018-08-09 NOTE — Care Management (Signed)
Kristen Watts with [facility] emailed and accepted the Patient for [facility] services

## 2018-08-09 NOTE — Progress Notes (Signed)
[employer] [facility] [place] [facility]   PATIENT NAME: Kristen Watts    MR#:  [id number]  DATE OF BIRTH:  1940/07/01  SUBJECTIVE:  CHIEF COMPLAINT:   Chief Complaint  Patient presents with  . Leg Swelling   No new complaint this morning.  No fevers.  Patient admitted and being managed for bilateral lower extremity cellulitis and edema which is clinically improving  REVIEW OF SYSTEMS:  Review of Systems  Constitutional: Negative for chills and fever.  HENT: Negative for hearing loss and tinnitus.   Eyes: Negative for blurred vision and double vision.  Respiratory: Positive for cough. Negative for shortness of breath.   Cardiovascular: Negative for chest pain and palpitations.  Gastrointestinal: Negative for heartburn and nausea.  Genitourinary: Negative for dysuria and urgency.  Musculoskeletal: Negative for back pain and myalgias.       Redness and swelling on both lower extremity  Skin: Negative for itching.       Chronic appearing nonpruritic skin rash  Neurological: Negative for dizziness and headaches.  Psychiatric/Behavioral: Negative for depression and hallucinations.    DRUG ALLERGIES:   Allergies  Allergen Reactions  . Clindamycin/Lincomycin   . Iodine Swelling  . Stadol [Butorphanol] Other (See Comments)    LOC  . Darvon [Propoxyphene] Rash  . Demerol [Meperidine] Palpitations  . Latex Rash   VITALS:  Blood pressure (!) 106/45, pulse 77, temperature 98.3 F (36.8 C), temperature source Oral, resp. rate 16, height 4\' 10"  (1.473 m), weight 75 kg, SpO2 98 %. PHYSICAL EXAMINATION:  Physical Exam  Constitutional: She is oriented to person, place, and time. She appears well-developed and well-nourished.  HENT:  Head: Normocephalic and atraumatic.  Mouth/Throat: Oropharynx is clear and moist.  Eyes: Pupils are equal, round, and reactive to light. Conjunctivae and EOM are normal. Right eye exhibits no discharge.  Neck: Normal range of motion. No  tracheal deviation present. No thyromegaly present.  Cardiovascular: Normal rate, regular rhythm and normal heart sounds.  Respiratory: Effort normal and breath sounds normal. No respiratory distress.  GI: Soft. Bowel sounds are normal. There is no abdominal tenderness.  Musculoskeletal: Normal range of motion.        General: Edema present.     Comments: Redness and swelling in both lower extremity with improvement  Neurological: She is alert and oriented to person, place, and time.  Skin: Skin is warm. She is not diaphoretic.  Redness and swelling on both lower extremity.  Psychiatric: She has a normal mood and affect. Her behavior is normal.    LABORATORY PANEL:  Female CBC Recent Labs  Lab 08/08/18 0500  WBC 9.0  HGB 9.1*  HCT 31.9*  PLT 179   ------------------------------------------------------------------------------------------------------------------ Chemistries  Recent Labs  Lab 08/09/18 0405  NA 138  K 4.2  CL 103  CO2 28  GLUCOSE 137*  BUN 22  CREATININE 0.81  CALCIUM 8.9  MG 2.3   RADIOLOGY:  No results found. ASSESSMENT AND PLAN:   1. Bilateral leg cellulitis. Patient admitted to medical floor.  Patient currently on broad-spectrum IV antibiotics with IV vancomycin and Zosyn with significant improvement.  Has had 3 days of IV vancomycin which will be discontinued today and then continued IV Zosyn.  Anticipate transitioning to p.o. antibiotics on discharge..  Failed outpatient antibiotics with doxycycline.  Venous ultrasound of bilateral leg this month did not show any DVT.  Clinically findings more consistent with cellulitis  2.  Bilateral lower extremity edema.   Patient  already placed on gentle diuresis with IV Lasix with appreciable improvement this morning.  2D echocardiogram done with ejection fraction of 60 to 65%..    3.  Moderate COPD exacerbation   Seen by pulmonologist.  Appreciate input.   To continue antibiotics as above.  DuoNeb.   Prednisone 40 mg daily added.  Chest physiotherapy.  Incentive spirometer.  Flutter valve per respiratory therapist.  Seen by physical therapist.  Recommended home health with physical therapy on discharge  4.  Chronic hypoxic respiratory failure Stable.  Patient uses 4 L of home oxygen therapy.  Continue the same.  5.  Diabetes mellitus type 2. Sliding scale insulin coverage.  Metformin on hold.  DVT prophylaxis; Lovenox   All the records are reviewed and case discussed with Care Management/Social Worker. Management plans discussed with the patient, family and they are in agreement.  CODE STATUS: Full Code  TOTAL TIME TAKING CARE OF THIS PATIENT: 26 minutes.   More than 50% of the time was spent in counseling/coordination of care: YES  POSSIBLE D/C IN 1 DAY, DEPENDING ON CLINICAL CONDITION.   Kristen Watts M.D on 08/09/2018 at 12:38 PM  Between 7am to 6pm - Pager - [phone]  After 6pm go to [email or web address] - Social research officer, government  [employer] [facility]  Office  [phone]  CC: Primary care physician; Barbette Reichmann, MD  Note: This dictation was prepared with Dragon dictation along with smaller phrase technology. Any transcriptional errors that result from this process are unintentional.

## 2018-08-10 LAB — GLUCOSE, CAPILLARY
Glucose-Capillary: 91 mg/dL (ref 70–99)
Glucose-Capillary: 192 mg/dL — ABNORMAL HIGH (ref 70–99)

## 2018-08-10 MED ORDER — CEFUROXIME AXETIL 250 MG PO TABS: 250.0000 mg | ORAL_TABLET | Freq: Two times a day (BID) | ORAL | 0 refills | Status: AC

## 2018-08-10 MED ORDER — FUROSEMIDE 20 MG PO TABS: 20.0000 mg | ORAL_TABLET | Freq: Two times a day (BID) | ORAL | 0 refills | Status: AC

## 2018-08-10 MED ORDER — PREDNISONE 20 MG PO TABS: 40.0000 mg | ORAL_TABLET | Freq: Every day | ORAL | 0 refills | Status: AC

## 2018-08-10 NOTE — Care Management Important Message (Signed)
Important Message  Patient Details  Name: Kristen Watts MRN: [id number] Date of Birth: May 16, 1941   Medicare Important Message Given:  Yes    Olegario Messier A Laikyn Gewirtz 08/10/2018, 11:30 AM

## 2018-08-10 NOTE — Discharge Summary (Signed)
[facility] [place] [facility]   PATIENT NAME: Kristen Watts    MR#:  [id number]  DATE OF BIRTH:  1941-04-20  DATE OF ADMISSION:  08/07/2018   ADMITTING PHYSICIAN: Shaune Pollack, MD  DATE OF DISCHARGE: 08/10/2018  PRIMARY CARE PHYSICIAN: Barbette Reichmann, MD   ADMISSION DIAGNOSIS:  Cellulitis, unspecified cellulitis site [L03.90] DISCHARGE DIAGNOSIS:  Active Problems:   Bilateral lower leg cellulitis  SECONDARY DIAGNOSIS:   Past Medical History:  Diagnosis Date  . ADD (attention deficit disorder)   . Arthritis   . ASCVD (arteriosclerotic cardiovascular disease)   . Asthma   . Blood transfusion without reported diagnosis   . CHF (congestive heart failure) (HCC)   . COPD (chronic obstructive pulmonary disease) (HCC)   . Coronary artery disease   . Depression   . Diabetes mellitus without complication (HCC)   . Eczema   . GERD (gastroesophageal reflux disease)   . Hematuria   . Hyperlipidemia   . Hypertension   . Obesity   . Osteoarthritis   . Panic attacks   . Phlebitis   . Pulmonary embolism (HCC)   . PVD (peripheral vascular disease) (HCC)   . Sleep apnea   . Stroke (HCC)   . Thyroid disease    HOSPITAL COURSE:  Chief complaint; leg swelling and pain   History of presenting complaint; Kristen Watts  is a 78 y.o. female  who presented to the ED with above chief complaint.  She started to have right leg redness, pain and swelling 10 days ago, which has been worsening.  She has been taking doxycycline for several days without improvement.  She denies any fever or chills.  But she has worsening shortness of breath, cough and wheezing for the past 1 months.  She is following Dr. Meredeth Ide for COPD as outpatient.  Please refer to the H&P dictated for further details   Hospital course; 1. Bilateral leg cellulitis. Patient admitted to medical floor.  Patient currently on broad-spectrum IV antibiotics with IV vancomycin and Zosyn with significant  improvement.  Patient significantly improved clinically.  Transitioned to p.o. antibiotics with cefuroxime to complete treatment duration. Failed outpatient antibiotics with doxycycline.  Venous ultrasound of bilateral leg this month did not show any DVT. Clinically findings more consistent with cellulitis  2.  Bilateral lower extremity edema.   Significantly improved with diuresis with IV Lasix.  Transitioned to p.o. Lasix on discharge.  2D echocardiogram done with ejection fraction of 60 to 65%..    3.  Moderate COPD exacerbation   Seen by pulmonologist.  Appreciate input.   To continue antibiotics as above.  DuoNeb.  Prednisone 40 mg daily added during this admission.  Significantly improved clinically.  Appreciate input from pulmonologist.  Seen by physical therapist. Recommended home health with physical therapy on discharge  4.  Chronic hypoxic respiratory failure Stable.  Patient uses 4 L of home oxygen therapy.  Continue the same.  5.  Diabetes mellitus type 2. Resumed home regimen   DISCHARGE CONDITIONS:  Stable CONSULTS OBTAINED:  Treatment Team:  Vida Rigger, MD DRUG ALLERGIES:   Allergies  Allergen Reactions  . Clindamycin/Lincomycin   . Iodine Swelling  . Stadol [Butorphanol] Other (See Comments)    LOC  . Darvon [Propoxyphene] Rash  . Demerol [Meperidine] Palpitations  . Latex Rash   DISCHARGE MEDICATIONS:   Allergies as of 08/10/2018      Reactions   Clindamycin/lincomycin    Iodine Swelling   Stadol [  butorphanol] Other (See Comments)   LOC   Darvon [propoxyphene] Rash   Demerol [meperidine] Palpitations   Latex Rash      Medication List    STOP taking these medications   isosorbide mononitrate 60 MG 24 hr tablet Commonly known as:  IMDUR     TAKE these medications   Advair Diskus 250-50 MCG/DOSE Aepb Generic drug:  Fluticasone-Salmeterol Inhale 1 puff into the lungs 2 (two) times daily.   albuterol (2.5 MG/3ML) 0.083% nebulizer solution  Commonly known as:  PROVENTIL Take 2.5 mg by nebulization every 4 (four) hours as needed for wheezing or shortness of breath.   albuterol 108 (90 Base) MCG/ACT inhaler Commonly known as:  PROVENTIL HFA;VENTOLIN HFA Inhale 1-2 puffs into the lungs every 4 (four) hours as needed for wheezing or shortness of breath.   ALPRAZolam 0.25 MG tablet Commonly known as:  XANAX Take 0.25 mg by mouth at bedtime as needed for sleep.   Amitiza 24 MCG capsule Generic drug:  lubiprostone Take 1 capsule by mouth 2 (two) times daily.   atomoxetine 40 MG capsule Commonly known as:  STRATTERA Take 40 mg by mouth daily.   carvedilol 6.25 MG tablet Commonly known as:  COREG Take 6.25 mg by mouth 2 (two) times daily with a meal.   cefUROXime 250 MG tablet Commonly known as:  Ceftin Take 1 tablet (250 mg total) by mouth 2 (two) times daily for 5 days.   celecoxib 200 MG capsule Commonly known as:  CELEBREX Take 200 mg by mouth daily.   Cholecalciferol 250 MCG (10000 UT) Tabs Take 1,000 Units by mouth daily.   citalopram 10 MG tablet Commonly known as:  CELEXA Take 10 mg by mouth daily.   clotrimazole-betamethasone cream Commonly known as:  LOTRISONE Apply 1 application topically 2 (two) times daily.   cyanocobalamin 1000 MCG tablet Take by mouth daily.   cyclobenzaprine 5 MG tablet Commonly known as:  FLEXERIL Take 5 mg by mouth 2 (two) times daily as needed for muscle spasms.   Dexilant 60 MG capsule Generic drug:  dexlansoprazole Take 1 capsule by mouth daily.   ferrous sulfate 325 (65 FE) MG tablet Take 325 mg by mouth daily with breakfast.   furosemide 20 MG tablet Commonly known as:  Lasix Take 1 tablet (20 mg total) by mouth 2 (two) times daily for 30 days. What changed:    medication strength  See the new instructions.   hydrocortisone 2.5 % cream Apply 1 application topically 2 (two) times daily.   ipratropium-albuterol 0.5-2.5 (3) MG/3ML Soln Commonly known as:   DUONEB Inhale 3 mLs into the lungs 4 (four) times daily.   losartan 50 MG tablet Commonly known as:  COZAAR Take 50 mg by mouth daily.   metFORMIN 500 MG tablet Commonly known as:  GLUCOPHAGE TAKE ONE TABLET BY MOUTH EVERY MORNING WITH BREAKFAST   mupirocin ointment 2 % Commonly known as:  BACTROBAN Apply 1 application topically 3 (three) times daily.   nitroGLYCERIN 0.4 MG SL tablet Commonly known as:  NITROSTAT Place 0.4 mg under the tongue every 5 (five) minutes as needed for chest pain.   nystatin powder Commonly known as:  MYCOSTATIN/NYSTOP Apply 1 application topically 2 (two) times daily.   Potassium 99 MG Tabs Take 99 mg by mouth daily.   predniSONE 20 MG tablet Commonly known as:  DELTASONE Take 2 tablets (40 mg total) by mouth daily with breakfast for 5 days. Start taking on:  August 11, 2018  risperiDONE 1 MG tablet Commonly known as:  RISPERDAL Take 1 mg by mouth 2 (two) times daily.   rOPINIRole 0.5 MG tablet Commonly known as:  REQUIP Take 0.5 mg by mouth at bedtime.   Synthroid 150 MCG tablet Generic drug:  levothyroxine Take 150 mcg by mouth every morning.   thiamine 100 MG tablet Take 100 mg by mouth daily.   vitamin E 1000 UNIT capsule Take 1,000 Units by mouth daily.            Durable Medical Equipment  (From admission, onward)         Start     Ordered   08/09/18 1115  For home use only DME Bedside commode  Once    Question:  Patient needs a bedside commode to treat with the following condition  Answer:  Joint unsteady   08/09/18 1115           DISCHARGE INSTRUCTIONS:   DIET:  Diabetic diet DISCHARGE CONDITION:  Stable ACTIVITY:  Activity as tolerated OXYGEN:  Home Oxygen: Yes.    Oxygen Delivery: Oxygen via nasal cannula at 4 L at home DISCHARGE LOCATION:  home   If you experience worsening of your admission symptoms, develop shortness of breath, life threatening emergency, suicidal or homicidal thoughts you must  seek medical attention immediately by calling 911 or calling your MD immediately  if symptoms less severe.  You Must read complete instructions/literature along with all the possible adverse reactions/side effects for all the Medicines you take and that have been prescribed to you. Take any new Medicines after you have completely understood and accpet all the possible adverse reactions/side effects.   Please note  You were cared for by a hospitalist during your hospital stay. If you have any questions about your discharge medications or the care you received while you were in the hospital after you are discharged, you can call the unit and asked to speak with the hospitalist on call if the hospitalist that took care of you is not available. Once you are discharged, your primary care physician will handle any further medical issues. Please note that NO REFILLS for any discharge medications will be authorized once you are discharged, as it is imperative that you return to your primary care physician (or establish a relationship with a primary care physician if you do not have one) for your aftercare needs so that they can reassess your need for medications and monitor your lab values.    On the day of Discharge:  VITAL SIGNS:  Blood pressure (!) 106/54, pulse 69, temperature 97.7 F (36.5 C), temperature source Oral, resp. rate 18, height  (1.473 m), weight 75 kg, SpO2 100 %. PHYSICAL EXAMINATION:  GENERAL:  78 y.o.-year-old patient lying in the bed with no acute distress.  EYES: Pupils equal, round, reactive to light and accommodation. No scleral icterus. Extraocular muscles intact.  HEENT: Head atraumatic, normocephalic. Oropharynx and nasopharynx clear.  NECK:  Supple, no jugular venous distention. No thyroid enlargement, no tenderness.  LUNGS: Normal breath sounds bilaterally, no wheezing, rales,rhonchi or crepitation. No use of accessory muscles of respiration.  CARDIOVASCULAR: S1, S2  normal. No murmurs, rubs, or gallops.  ABDOMEN: Soft, non-tender, non-distended. Bowel sounds present. No organomegaly or mass.  EXTREMITIES: Significant improvement in swelling and redness of both lower extremities. NEUROLOGIC: Cranial nerves II through XII are intact. Muscle strength 5/5 in all extremities. Sensation intact. Gait not checked.  PSYCHIATRIC: The patient is alert and oriented x 3.  SKIN: No obvious rash, lesion, or ulcer.  DATA REVIEW:   CBC Recent Labs  Lab 08/08/18 0500  WBC 9.0  HGB 9.1*  HCT 31.9*  PLT 179    Chemistries  Recent Labs  Lab 08/09/18 0405  NA 138  K 4.2  CL 103  CO2 28  GLUCOSE 137*  BUN 22  CREATININE 0.81  CALCIUM 8.9  MG 2.3     Microbiology Results  Results for orders placed or performed during the hospital encounter of 04/29/17  Urine Culture     Status: Abnormal   Collection Time: 04/29/17  3:39 PM  Result Value Ref Range Status   Specimen Description URINE, RANDOM  Final   Special Requests NONE  Final   Culture MULTIPLE SPECIES PRESENT, SUGGEST RECOLLECTION (A)  Final   Report Status 05/01/2017 FINAL  Final    RADIOLOGY:  No results found.   Management plans discussed with the patient, family and they are in agreement.  CODE STATUS: Full Code   TOTAL TIME TAKING CARE OF THIS PATIENT: 40 minutes.    Hailee Hollick M.D on 08/10/2018 at 10:57 AM  Between 7am to 6pm - Pager - [phone]  After 6pm go to [email or web address] - Social research officer, government  [facility]  Office  [phone]  CC: Primary care physician; Barbette Reichmann, MD   Note: This dictation was prepared with Dragon dictation along with smaller phrase technology. Any transcriptional errors that result from this process are unintentional.

## 2018-08-10 NOTE — Discharge Planning (Signed)
Patient IV removed. RN assessment and VS revealed stability for DC to home with [facility].  Discharge papers given, explained and educated.  Informed of suggested FU appts and office stated they would contact patient to set up.  Printed scripts given.  Left message at [facility] [phone]), per patient request, to have them set up a cab ride home for patient.  Waiting for return phone call.

## 2018-08-10 NOTE — Progress Notes (Signed)
Patient discharged and picked up by [facility] nurse aide

## 2018-08-10 NOTE — Progress Notes (Signed)
Physical Therapy Treatment Patient Details Name: Kristen Watts MRN: [id number] DOB: [birth year] Today's Date: 08/10/2018    History of Present Illness Patient is a 78 year old female admitted with B LE redness, swelling. B LE cellulitis. PMH to include: ADD, CAD, asthma, CHF, COPD, depression, DM, GERD, HLD, HTN, OA    PT Comments    Pt sitting EOB safely upon arrival.  Pt on 2 lpm sats 98-100%.  Pt stated she has used home O2 for years at 4 lpm and was surprised that she was on 2 lpm here.  Pt was able to ambulate around unit x 1 with walker and supervision with good balance dancing at times in the hallway while joking with staff with no LOB.  Sats remained 98-100% after gait on 2 lpm.  Pt left on 2 lpm despite some hesitancy by pt and she was encouraged to talk with RN and MD regarding appropriate O2 flow.  Discussed with RN who stated he will address with pt.     Follow Up Recommendations  Home health PT     Equipment Recommendations  None recommended by PT    Recommendations for Other Services       Precautions / Restrictions Precautions Precautions: Fall Restrictions Weight Bearing Restrictions: No    Mobility  Bed Mobility Overal bed mobility: Modified Independent                Transfers     Transfers: Sit to/from Stand Sit to Stand: Modified independent (Device/Increase time)            Ambulation/Gait Ambulation/Gait assistance: Supervision Gait Distance (Feet): 180 Feet Assistive device: Rolling walker (2 wheeled) Gait Pattern/deviations: Step-through pattern Gait velocity: decreased       Stairs             Wheelchair Mobility    Modified Rankin (Stroke Patients Only)       Balance Overall balance assessment: Mild deficits observed, not formally tested                                          Cognition Arousal/Alertness: Awake/alert Behavior During Therapy: WFL for tasks assessed/performed Overall Cognitive  Status: Within Functional Limits for tasks assessed                                        Exercises      General Comments        Pertinent Vitals/Pain Pain Assessment: No/denies pain    Home Living                      Prior Function            PT Goals (current goals can now be found in the care plan section) Progress towards PT goals: Progressing toward goals    Frequency    Min 2X/week      PT Plan Current plan remains appropriate    Co-evaluation              AM-PAC PT "6 Clicks" Mobility   Outcome Measure  Help needed turning from your back to your side while in a flat bed without using bedrails?: None Help needed moving from lying on your back to sitting on the side of a flat  bed without using bedrails?: None Help needed moving to and from a bed to a chair (including a wheelchair)?: None Help needed standing up from a chair using your arms (e.g., wheelchair or bedside chair)?: None Help needed to walk in hospital room?: None Help needed climbing 3-5 steps with a railing? : A Little 6 Click Score: 23    End of Session Equipment Utilized During Treatment: Gait belt;Oxygen Activity Tolerance: Patient tolerated treatment well Patient left: in bed;with call bell/phone within reach Nurse Communication: Mobility status Pain - part of body: Leg     Time: 2482-5003 PT Time Calculation (min) (ACUTE ONLY): 15 min  Charges:  $Gait Training: 8-22 mins                     Danielle Dess, PTA 08/10/18, 10:36 AM

## 2018-10-30 DEATH — deceased

## 2019-07-02 IMAGING — US US EXTREM LOW VENOUS
1 series · 13 of 24 positions shown · non-contrast
Comparison: Bilateral lower extremity ultrasound 3 weeks prior
07/13/2018.

CLINICAL DATA: Lower extremity swelling.



[Series 1: us extrem low venous · 0.09mm/px · 13 of 60 slices shown]
[im 1/60]
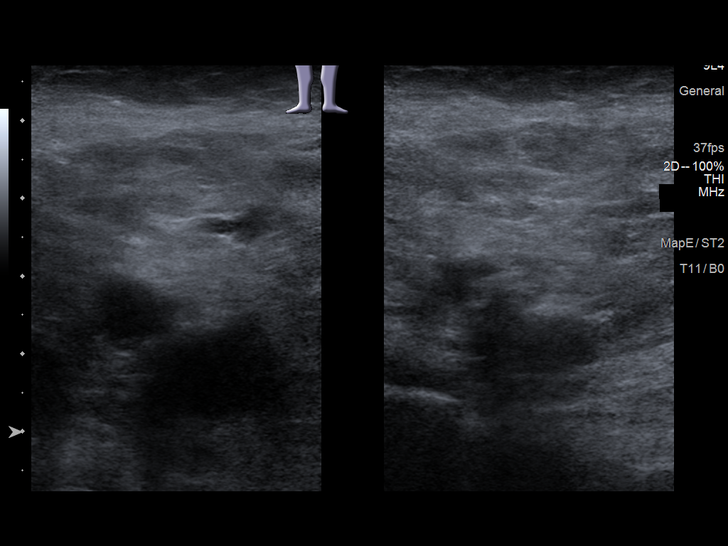
[im 6/60]
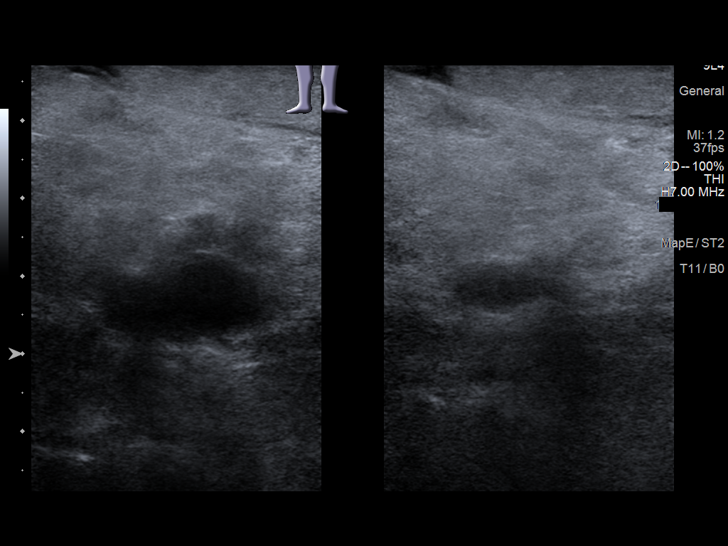
[im 11/60]
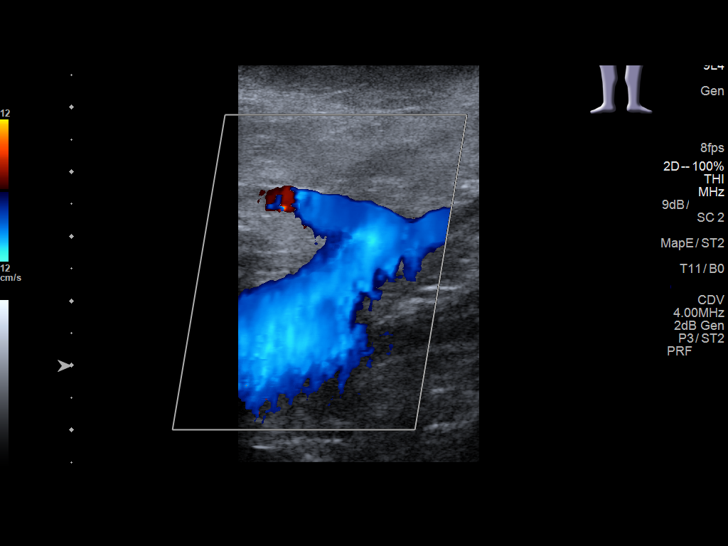
[im 16/60]
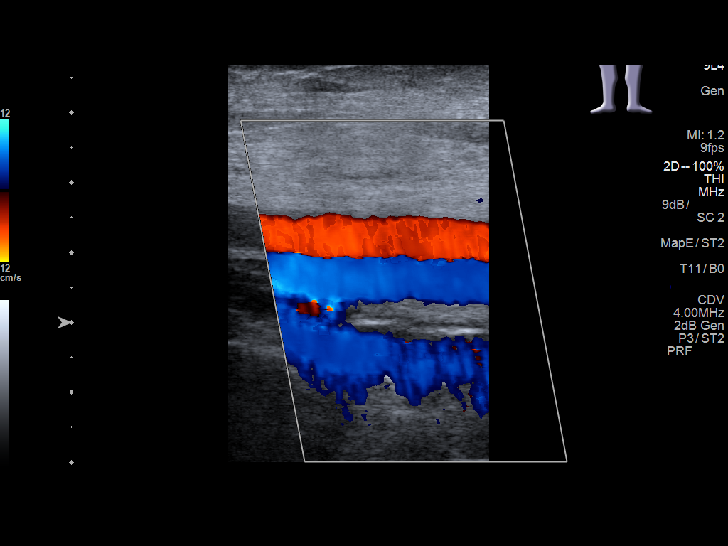
[im 21/60]
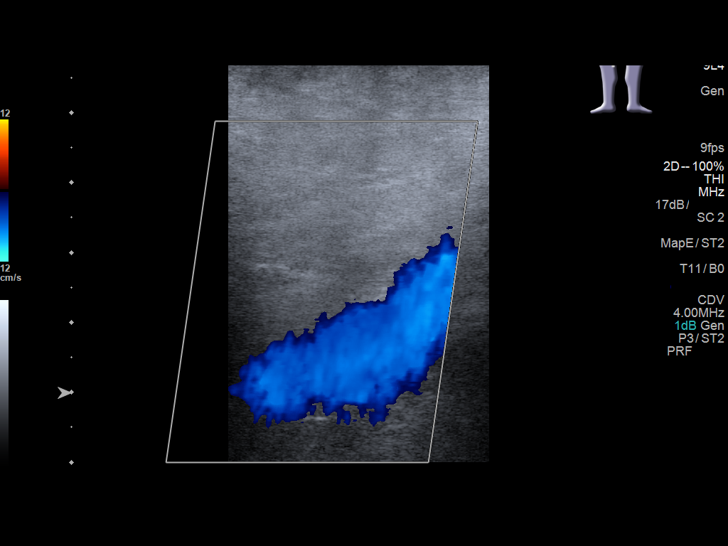
[im 26/60]
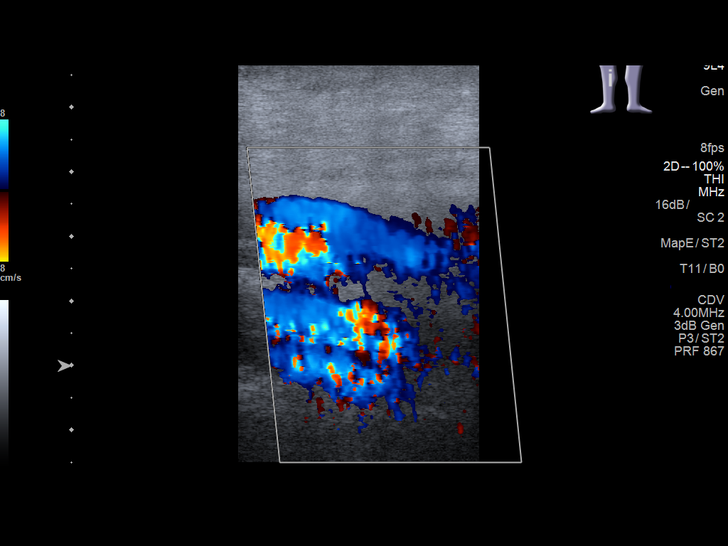
[im 31/60]
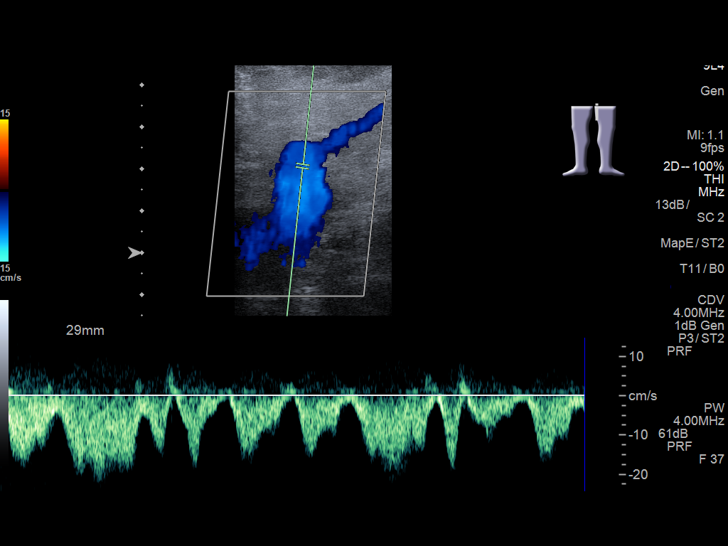
[im 34/60]
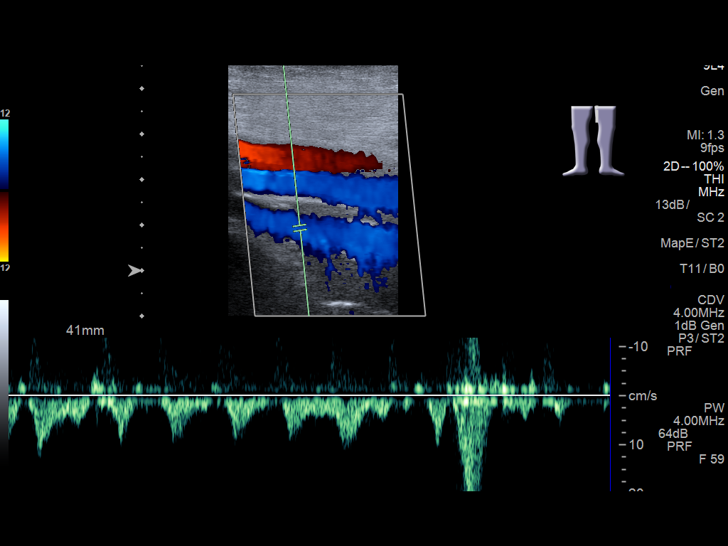
[im 39/60]
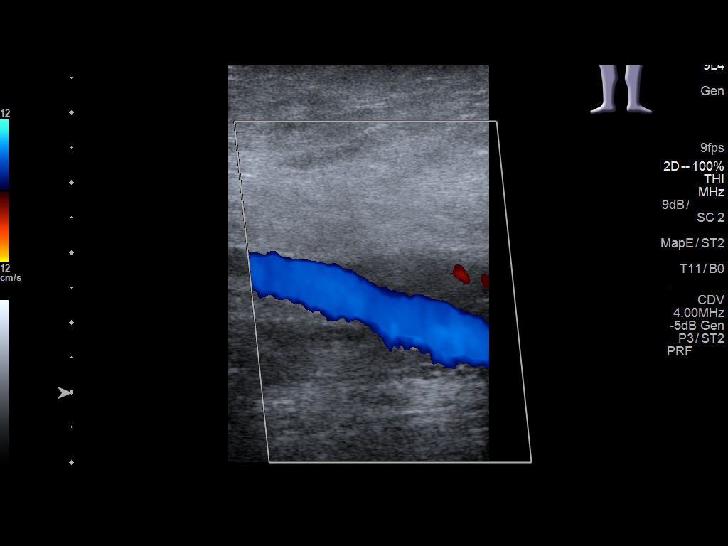
[im 44/60]
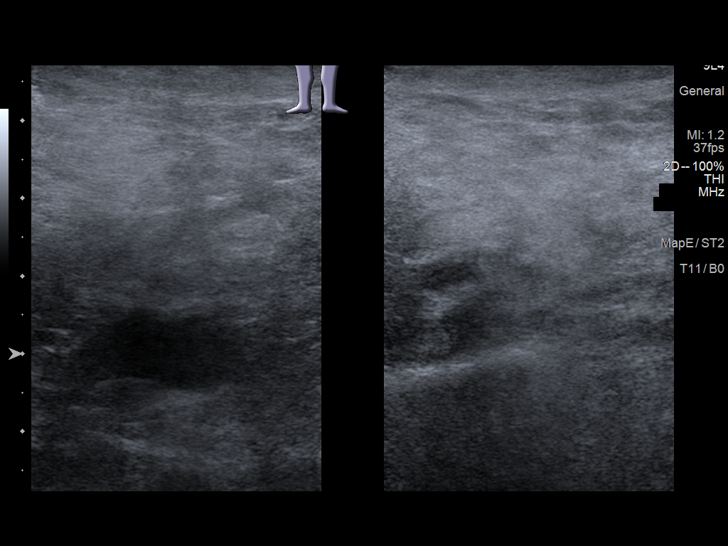
[im 49/60]
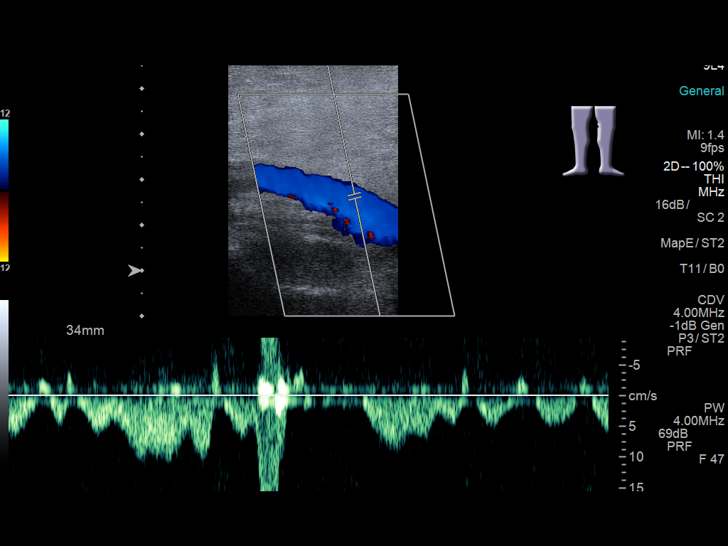
[im 54/60]
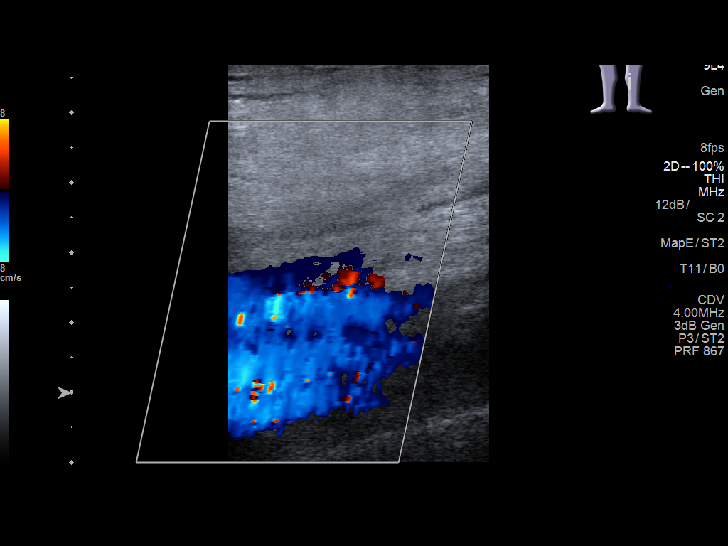
[im 60/60]
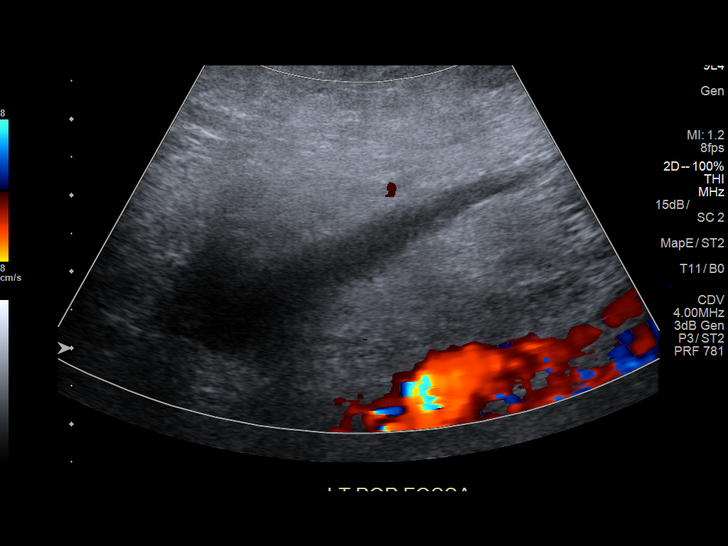

[13 of 24 positions shown; findings below may reference images not displayed]

FINDINGS: RIGHT LOWER EXTREMITY

Common Femoral Vein: No evidence of thrombus. Normal
compressibility, respiratory phasicity and response to augmentation.

Saphenofemoral Junction: No evidence of thrombus. Normal
compressibility and flow on color Doppler imaging.

Profunda Femoral Vein: No evidence of thrombus. Normal
compressibility and flow on color Doppler imaging.

Femoral Vein: No evidence of thrombus. Normal compressibility,
respiratory phasicity and response to augmentation.

Popliteal Vein: No evidence of thrombus. Normal compressibility,
respiratory phasicity and response to augmentation.

Calf Veins: No evidence of thrombus. Normal compressibility and flow
on color Doppler imaging.

Superficial Great Saphenous Vein: No evidence of thrombus. Normal
compressibility.

Venous Reflux:  None.

Other Findings:  Subcutaneous soft tissue edema.

LEFT LOWER EXTREMITY

Common Femoral Vein: No evidence of thrombus. Normal
compressibility, respiratory phasicity and response to augmentation.

Saphenofemoral Junction: No evidence of thrombus. Normal
compressibility and flow on color Doppler imaging.

Profunda Femoral Vein: No evidence of thrombus. Normal
compressibility and flow on color Doppler imaging.

Femoral Vein: No evidence of thrombus. Normal compressibility,
respiratory phasicity and response to augmentation.

Popliteal Vein: No evidence of thrombus. Normal compressibility,
respiratory phasicity and response to augmentation.

Calf Veins: No evidence of thrombus. Normal compressibility and flow
on color Doppler imaging.

Superficial Great Saphenous Vein: No evidence of thrombus. Normal
compressibility.

Venous Reflux:  None.

Other Findings:  Baker cyst measuring 5 x 1.6 x 3.0 cm.
IMPRESSION: 1. No evidence of bilateral lower extremity deep venous thrombosis.
2. Subcutaneous soft tissue edema in the right lower extremity.
Again seen left Baker cyst.
